# Patient Record
Sex: Female | Born: 1935 | Race: White | Hispanic: No | Smoking: Never smoker
Health system: Southern US, Community
[De-identification: ages and names within clinical notes are randomized; demographics above are authoritative.]

## PROBLEM LIST (undated history)

## (undated) DIAGNOSIS — W19XXXA Unspecified fall, initial encounter: Secondary | ICD-10-CM

## (undated) DIAGNOSIS — F0390 Unspecified dementia without behavioral disturbance: Secondary | ICD-10-CM

## (undated) DIAGNOSIS — H9190 Unspecified hearing loss, unspecified ear: Secondary | ICD-10-CM

## (undated) DIAGNOSIS — R519 Headache, unspecified: Secondary | ICD-10-CM

## (undated) DIAGNOSIS — R413 Other amnesia: Secondary | ICD-10-CM

## (undated) DIAGNOSIS — K859 Acute pancreatitis without necrosis or infection, unspecified: Secondary | ICD-10-CM

## (undated) DIAGNOSIS — F419 Anxiety disorder, unspecified: Secondary | ICD-10-CM

## (undated) DIAGNOSIS — R296 Repeated falls: Secondary | ICD-10-CM

## (undated) DIAGNOSIS — R51 Headache: Secondary | ICD-10-CM

## (undated) DIAGNOSIS — E119 Type 2 diabetes mellitus without complications: Secondary | ICD-10-CM

## (undated) HISTORY — DX: Repeated falls: R29.6

## (undated) HISTORY — PX: CHOLECYSTECTOMY: SHX55

## (undated) HISTORY — DX: Acute pancreatitis without necrosis or infection, unspecified: K85.90

## (undated) HISTORY — DX: Type 2 diabetes mellitus without complications: E11.9

## (undated) HISTORY — PX: COLONOSCOPY: SHX174

## (undated) HISTORY — DX: Unspecified fall, initial encounter: W19.XXXA

## (undated) HISTORY — DX: Unspecified dementia, unspecified severity, without behavioral disturbance, psychotic disturbance, mood disturbance, and anxiety: F03.90

---
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
## 1995-06-14 HISTORY — PX: GALLBLADDER SURGERY: SHX652

## 2001-06-13 HISTORY — PX: PARATHYROIDECTOMY / EXPLORATION OF PARATHYROIDS: SUR1002

## 2001-10-04 ENCOUNTER — Ambulatory Visit (HOSPITAL_COMMUNITY): Admission: RE | Admit: 2001-10-04 | Discharge: 2001-10-04 | Payer: Self-pay | Admitting: General Surgery

## 2001-10-04 ENCOUNTER — Encounter: Payer: Self-pay | Admitting: General Surgery

## 2002-03-11 ENCOUNTER — Encounter: Payer: Self-pay | Admitting: General Surgery

## 2002-03-13 ENCOUNTER — Ambulatory Visit (HOSPITAL_COMMUNITY): Admission: RE | Admit: 2002-03-13 | Discharge: 2002-03-14 | Payer: Self-pay | Admitting: General Surgery

## 2003-06-14 HISTORY — PX: RECTAL POLYPECTOMY: SHX2309

## 2003-06-14 HISTORY — PX: FLEXIBLE SIGMOIDOSCOPY: SHX1649

## 2003-10-08 ENCOUNTER — Ambulatory Visit (HOSPITAL_COMMUNITY): Admission: RE | Admit: 2003-10-08 | Discharge: 2003-10-08 | Payer: Self-pay | Admitting: Gastroenterology

## 2004-01-28 ENCOUNTER — Ambulatory Visit (HOSPITAL_COMMUNITY): Admission: RE | Admit: 2004-01-28 | Discharge: 2004-01-28 | Payer: Self-pay | Admitting: Gastroenterology

## 2004-03-05 ENCOUNTER — Ambulatory Visit (HOSPITAL_COMMUNITY): Admission: RE | Admit: 2004-03-05 | Discharge: 2004-03-05 | Payer: Self-pay | Admitting: General Surgery

## 2004-11-10 ENCOUNTER — Ambulatory Visit (HOSPITAL_COMMUNITY): Admission: RE | Admit: 2004-11-10 | Discharge: 2004-11-10 | Payer: Self-pay | Admitting: Gastroenterology

## 2013-05-08 ENCOUNTER — Encounter: Payer: Medicare Other | Attending: Internal Medicine | Admitting: Dietician

## 2013-05-08 VITALS — Ht 64.0 in | Wt 176.9 lb

## 2013-05-08 DIAGNOSIS — E119 Type 2 diabetes mellitus without complications: Secondary | ICD-10-CM

## 2013-05-08 DIAGNOSIS — Z713 Dietary counseling and surveillance: Secondary | ICD-10-CM | POA: Insufficient documentation

## 2013-05-08 NOTE — Progress Notes (Signed)
Patient was seen on 05/08/13 for the first of a series of three diabetes self-management courses at the [facility].  Current HbA1c: 6.7 on 7/28  The following learning objectives were met by the patient during this class:  Describe diabetes  State some common risk factors for diabetes  Defines the role of glucose and insulin  Identifies type of diabetes and pathophysiology  Describe the relationship between diabetes and cardiovascular risk  State the members of the Healthcare Team  States the rationale for glucose monitoring  State when to test glucose  State their individual Target Range  State the importance of logging glucose readings  Describe how to interpret glucose readings  Identifies A1C target  Explain the correlation between A1c and eAG values  State symptoms and treatment of high blood glucose  State symptoms and treatment of low blood glucose  Explain proper technique for glucose testing  Identifies proper sharps disposal  Handouts given during class include:  Living Well with Diabetes book  Carb Counting and Meal Planning book  Meal Plan Card  Carbohydrate guide  Meal planning worksheet  Low Sodium Flavoring Tips  The diabetes portion plate  Low Carbohydrate Snack Suggestions  A1c to eAG Conversion Chart  Diabetes Medications  Stress Management  Diabetes Recommended Care Schedule  Diabetes Success Plan  Core Class Satisfaction Survey  Your patient has identified their diabetes care support plan as:  [facility]  Staff   Follow-Up Plan:  Attend core 2

## 2013-05-08 NOTE — Patient Instructions (Signed)
Goals:  Monitor glucose levels as instructed by your doctor 

## 2013-05-15 ENCOUNTER — Encounter: Payer: Medicare Other | Attending: Internal Medicine | Admitting: Dietician

## 2013-05-15 DIAGNOSIS — E119 Type 2 diabetes mellitus without complications: Secondary | ICD-10-CM | POA: Insufficient documentation

## 2013-05-15 DIAGNOSIS — Z713 Dietary counseling and surveillance: Secondary | ICD-10-CM | POA: Insufficient documentation

## 2013-05-15 NOTE — Patient Instructions (Signed)
Goals:  Follow Diabetes Meal Plan as instructed  Eat 3 meals and 2 snacks, every 3-5 hrs  Limit carbohydrate intake to 30-45 grams carbohydrate/meal  Limit carbohydrate intake to 0-15 grams carbohydrate/snack  Add lean protein foods to meals/snacks  Monitor glucose levels as instructed by your doctor  Bring food record and glucose log to your next nutrition visit 

## 2013-05-15 NOTE — Progress Notes (Signed)

## 2013-06-11 DIAGNOSIS — E119 Type 2 diabetes mellitus without complications: Secondary | ICD-10-CM

## 2013-06-11 NOTE — Progress Notes (Signed)
Patient was seen on 06/11/13 for the third of a series of three diabetes self-management courses at the [facility]. The following learning objectives were met by the patient during this class:    State the amount of activity recommended for healthy living   Describe activities suitable for individual needs   Identify ways to regularly incorporate activity into daily life   Identify barriers to activity and ways to over come these barriers  Identify diabetes medications being personally used and their primary action for lowering glucose and possible side effects   Describe role of stress on blood glucose and develop strategies to address psychosocial issues   Identify diabetes complications and ways to prevent them  Explain how to manage diabetes during illness   Evaluate success in meeting personal goal   Establish 2-3 goals that they will plan to diligently work on until they return for the  16-month follow-up visit  Goals:  Follow Diabetes Meal Plan as instructed  Aim for 15-30 mins of physical activity daily as tolerated  Bring food record and glucose log to your follow up visit  Your patient has established the following 4 month goals in their individualized success plan:  Count carbohydrates at most meals and snacks  Reduce fat by eating less butter and mayonnaise at two or more meals a day  Your patient has identified these potential barriers to change:  I don't remember what I read  Colleen Huang stated that she was interested by the material covered in our Core classes, but she stated over and over that she was not retaining information that she learned or read and that she "was not going to be able to work on any of this until after the holidays".    Your patient has identified their diabetes self-care support plan as  [facility]  Colleen Huang, a long-time friend who also has diabetes

## 2013-10-09 ENCOUNTER — Encounter: Payer: Medicare Other | Attending: Internal Medicine | Admitting: *Deleted

## 2013-10-09 ENCOUNTER — Encounter: Payer: Self-pay | Admitting: *Deleted

## 2013-10-09 VITALS — Ht 64.0 in | Wt 174.7 lb

## 2013-10-09 DIAGNOSIS — E119 Type 2 diabetes mellitus without complications: Secondary | ICD-10-CM

## 2013-10-09 DIAGNOSIS — Z713 Dietary counseling and surveillance: Secondary | ICD-10-CM | POA: Insufficient documentation

## 2013-10-09 NOTE — Progress Notes (Signed)
  Patient was seen on 10/09/13 for her 4 month follow-up as a part of the diabetes self-management courses at the [facility].   Patient self reports the following: Nutrition: "Try to stay away from the sweets". Loves chocolate. Has something sweet 4-7 times per week. She does not really pay any attention to her dietary intake. She eats what ever she desires. This does not seem to be adversely affecting her glucose readings. Random BS: 98mg /dl Exercise: None Psychosocial: Colleen Huang lives alone and still drives. She notes that she has children, one as close as [place]. She does not recall her family checking in on her. She has an alert system that she does not wear. I have strongly encouraged her to wear this for her safety. She notes that she has poor memory, and does not have any recollection of the information she received in the core classes.  Diabetes control has improved since diabetes self-management training: No change due to poor memory Number of days blood glucose is >200: Does not test Last MD appointment for diabetes: 2 weeks ago Willingness to participate in diabetes support group: not at this time  Follow-Up Plan: Patient to call and schedule as needed.

## 2014-12-24 ENCOUNTER — Ambulatory Visit: Payer: Medicare Other | Admitting: Diagnostic Neuroimaging

## 2015-01-06 ENCOUNTER — Ambulatory Visit (INDEPENDENT_AMBULATORY_CARE_PROVIDER_SITE_OTHER): Payer: Medicare Other | Admitting: Neurology

## 2015-01-06 ENCOUNTER — Telehealth: Payer: Self-pay | Admitting: *Deleted

## 2015-01-06 ENCOUNTER — Encounter: Payer: Self-pay | Admitting: Neurology

## 2015-01-06 VITALS — BP 157/67 | HR 62 | Temp 97.7°F | Ht 64.0 in | Wt 168.8 lb

## 2015-01-06 DIAGNOSIS — R413 Other amnesia: Secondary | ICD-10-CM | POA: Diagnosis not present

## 2015-01-06 DIAGNOSIS — E785 Hyperlipidemia, unspecified: Secondary | ICD-10-CM

## 2015-01-06 DIAGNOSIS — E119 Type 2 diabetes mellitus without complications: Secondary | ICD-10-CM

## 2015-01-06 DIAGNOSIS — I10 Essential (primary) hypertension: Secondary | ICD-10-CM

## 2015-01-06 DIAGNOSIS — E538 Deficiency of other specified B group vitamins: Secondary | ICD-10-CM

## 2015-01-06 MED ORDER — DONEPEZIL HCL 10 MG PO TABS: 10.0000 mg | ORAL_TABLET | Freq: Every day | ORAL | Status: DC

## 2015-01-06 MED ORDER — DONEPEZIL HCL 5 MG PO TABS: 5.0000 mg | ORAL_TABLET | Freq: Every day | ORAL | Status: DC

## 2015-01-06 NOTE — Progress Notes (Signed)
[facility]    Provider:  Dr Lucia Gaskins Referring Provider: [name], MD Primary Care Physician:  [name], MD  CC:  Memory problems  HPI:  Colleen Huang is a 81 y.o. female here as a referral from Dr. Julio Sicks for memory problems. PMHx HTN, DM, HLD.   Here with daughter-in-law. Memory issues for many years, worsening in the last 5 years. Patient keeps a calendar and writes things down. She checks her calendar every day. She cant remember what happened yesterday, gets confused. More short-term memory loss. She can remember things that happened a long time ago. She is forgetting her grandchildren's birthday more recently. She lives at home alone. She left the oven on one time and burnt brownies. She pays her own bills, she is up to date on her bills. She knows the bills she needs to pay. She is fine day to day, cooks her own meals, takes care of herself. She drives short distances to familiar places like church and the grocery store. She doesn't like to drive at night. She is stubborn and independent. Likes to be independent. She has lost a lot of friends and that is upsetting but no mood problems or depression. No hallucinations or delusions. Not missing any medications, manages herself. She keeps her own house. No confusional episodes. She repeats things a lot. No alterations of consciousness. Brother with Alzheimer's. Patient says every once in a while she is confused. Patient says she didn't want to come here today.   Review of Systems: Patient complains of symptoms per HPI as well as the following symptoms: weight gain, memory loss, confusion, headache. Pertinent negatives per HPI. All others negative.   History   Social History  . Marital Status: Widowed    Spouse Name: N/A  . Number of Children: 4  . Years of Education: 15   Occupational History  . Not on file.   Social History Main Topics  . Smoking status: Never Smoker   . Smokeless  tobacco: Not on file  . Alcohol Use: No  . Drug Use: No  . Sexual Activity: Not on file   Other Topics Concern  . Not on file   Social History Narrative   Lives at home by herself.   Caffeine use: 1-2 cups hot tea/day.     History reviewed. No pertinent family history.  Past Medical History  Diagnosis Date  . Hyperlipidemia   . Obesity   . Diabetes mellitus without complication     Past Surgical History  Procedure Laterality Date  . Appendectomy  1935  . Tonsillectomy  1949  . Parathyroidectomy / exploration of parathyroids  2003  . Gallbladder surgery  1997  . Colonoscopy  2005, 2009  . Flexible sigmoidoscopy  2005  . Rectal polypectomy  2005    Current Outpatient Prescriptions  Medication Sig Dispense Refill  . amLODipine (NORVASC) 10 MG tablet Take 10 mg by mouth daily.  1  . aspirin 81 MG tablet Take 81 mg by mouth daily.    [name] atorvastatin (LIPITOR) 20 MG tablet Take 20 mg by mouth daily.    . Calcium Carb-Cholecalciferol (CALCIUM 600 + D PO) Take by mouth.    . Cholecalciferol (VITAMIN D3) 2000 UNITS TABS Take by mouth.    . fenofibrate (TRICOR) 145 MG tablet Take 145 mg by mouth daily.    [name] latanoprost (XALATAN) 0.005 % ophthalmic solution 1 drop at bedtime.    [name] levothyroxine (SYNTHROID, LEVOTHROID) 137 MCG tablet Take 137 mcg  by mouth daily.  11  . naproxen sodium (ANAPROX) 220 MG tablet Take 220 mg by mouth 2 (two) times daily with a meal.    . Omega-3 Fatty Acids (FISH OIL) 1200 MG CAPS Take by mouth.    . simvastatin (ZOCOR) 40 MG tablet Take 40 mg by mouth daily.    . timolol (BETIMOL) 0.5 % ophthalmic solution 1 drop 2 (two) times daily.    [name] donepezil (ARICEPT) 10 MG tablet Take 1 tablet (10 mg total) by mouth at bedtime. 30 tablet 11  . donepezil (ARICEPT) 5 MG tablet Take 1 tablet (5 mg total) by mouth at bedtime. 30 tablet 0  . orphenadrine (NORFLEX) 100 MG tablet Take 100 mg by mouth 2 (two) times daily.  0   No current facility-administered  medications for this visit.    Allergies as of 01/06/2015 - Review Complete 10/09/2013  Allergen Reaction Noted  . Codeine  05/08/2013  . Hydrocodone  05/08/2013  . Penicillins  05/08/2013  . Percocet [oxycodone-acetaminophen]  05/08/2013  . Sulfa antibiotics  05/08/2013    Vitals: BP 157/67 mmHg  Pulse 62  Temp(Src) 97.7 F (36.5 C) (Oral)  Ht 5\' 4"  (1.626 m)  Wt 168 lb 12.8 oz (76.567 kg)  BMI 28.96 kg/m2 Last Weight:  Wt Readings from Last 1 Encounters:  01/06/15 168 lb 12.8 oz (76.567 kg)   Last Height:   Ht Readings from Last 1 Encounters:  01/06/15 5\' 4"  (1.626 m)    Physical exam: Exam: Gen: NAD, conversant, well nourised, overweight, well groomed                     CV: RRR, no MRG. No Carotid Bruits. No peripheral edema, warm, nontender Eyes: Conjunctivae clear without exudates or hemorrhage  Neuro: Detailed Neurologic Exam  Speech:    Speech is normal; fluent and spontaneous with normal comprehension.  Cognition: MoCA 19/30 (-1 visuosptaial execution, -1 naming, -1 language, -2 abstraction, -5 delayed recall, -1 orientation)    The patient is oriented to person, place, month, year;     recent memory impaired and remote memory intact;     language fluent;     normal attention, concentration,     fund of knowledge appears grossly intact Cranial Nerves:    The pupils are equal, round, and reactive to light. Attempted fundi, could not visualize.  Visual fields are full to finger confrontation. Extraocular movements are intact. Trigeminal sensation is intact and the muscles of mastication are normal. The face is symmetric. The palate elevates in the midline. Hearing intact. Voice is normal. Shoulder shrug is normal. The tongue has normal motion without fasciculations.   Coordination:    No dysmetria   Gait:    Not ataxic  Motor Observation:    No asymmetry, no atrophy, and no involuntary movements noted. Tone:    Normal muscle tone.    Posture:     Posture is normal. normal erect    Strength:    Strength is V/V in the upper and lower limbs.      Sensation: intact to LT     Reflex Exam:  DTR's:    Deep tendon reflexes in the upper and lower extremities are symmetrical bilaterally.   Toes:    The toes are downgoing bilaterally.   Clonus:    Clonus is absent.      Assessment/Plan:  81 y.o. female here as a referral from Dr. Julio Sicks for memory problems. PMHx HTN, DM, HLD.  MoCA 19/30. Mild to moderate Cognitive impairment. Will order labs, MRi of the brain, will start aricept with instruction to increase to  after a month if tolerated. There can be serious side effects include cardiac complications but common side effects include nausea, diarrhea, gi upset. Stop for anything concerning.f/u 6 months.   Naomie Dean, MD  [facility] [address] [place], [place] [address]  Phone [phone] Fax [phone]

## 2015-01-06 NOTE — Patient Instructions (Signed)
Overall you are doing very well but I do want to suggest a few things today:   Remember to drink plenty of fluid, eat healthy meals and do not skip any meals. Try to eat protein with a every meal and eat a healthy snack such as fruit or nuts in between meals. Try to keep a regular sleep-wake schedule and try to exercise daily, particularly in the form of walking, 20-30 minutes a day, if you can.   As far as your medications are concerned, I would like to suggest: Start Aricept (Donepezil) . In one month can increase to  pills.  As far as diagnostic testing: Labs, MRi of the brain  I would like to see you back in 6 months, sooner if we need to. Please call us with any interim questions, concerns, problems, updates or refill requests.   Please also call us for any test results so we can go over those with you on the phone.  My clinical assistant and will answer any of your questions and relay your messages to me and also relay most of my messages to you.   Our phone number is [phone]. We also have an after hours call service for urgent matters and there is a physician on-call for urgent questions. For any emergencies you know to call 911 or go to the nearest emergency room

## 2015-01-06 NOTE — Telephone Encounter (Signed)
Left message for nurse to call back about whether the pt has had a recent MRI completed. Gave [facility] phone number and office hours.

## 2015-01-07 ENCOUNTER — Other Ambulatory Visit: Payer: Self-pay | Admitting: Neurology

## 2015-01-07 DIAGNOSIS — E538 Deficiency of other specified B group vitamins: Secondary | ICD-10-CM

## 2015-01-07 LAB — COMPREHENSIVE METABOLIC PANEL
ALT: 16 IU/L (ref 0–32)
AST: 15 IU/L (ref 0–40)
Albumin/Globulin Ratio: 1.8 (ref 1.1–2.5)
Albumin: 4.2 g/dL (ref 3.5–4.7)
Alkaline Phosphatase: 47 IU/L (ref 39–117)
BUN/Creatinine Ratio: 25 (ref 11–26)
BUN: 26 mg/dL (ref 8–27)
Bilirubin Total: 0.4 mg/dL (ref 0.0–1.2)
CO2: 23 mmol/L (ref 18–29)
Calcium: 10 mg/dL (ref 8.7–10.3)
Chloride: 100 mmol/L (ref 97–108)
Creatinine, Ser: 1.03 mg/dL — ABNORMAL HIGH (ref 0.57–1.00)
GFR calc Af Amer: 56 mL/min/{1.73_m2} — ABNORMAL LOW (ref >59)
GFR, EST NON AFRICAN AMERICAN: 48 mL/min/{1.73_m2} — AB (ref >59)
GLUCOSE: 99 mg/dL (ref 65–99)
Globulin, Total: 2.4 g/dL (ref 1.5–4.5)
POTASSIUM: 4.4 mmol/L (ref 3.5–5.2)
SODIUM: 140 mmol/L (ref 134–144)
Total Protein: 6.6 g/dL (ref 6.0–8.5)

## 2015-01-07 LAB — TSH: TSH: 0.451 u[IU]/mL (ref 0.450–4.500)

## 2015-01-07 LAB — B12 AND FOLATE PANEL
Folate: 11.8 ng/mL (ref >3.0)
Vitamin B-12: 170 pg/mL — ABNORMAL LOW (ref 211–946)

## 2015-01-07 LAB — RPR: RPR Ser Ql: NONREACTIVE

## 2015-01-07 NOTE — Telephone Encounter (Signed)
Left another VM for someone to call back about whether pt has had a recent MRI done. Gave pt name and DOB. Gave [facility] phone number and office hours. Told them I left a message yesterday morning as well.

## 2015-01-09 ENCOUNTER — Telehealth: Payer: Self-pay

## 2015-01-09 NOTE — Telephone Encounter (Signed)
LVM for pt to call office to receive lab results, she has very low B12. She needs to take oral B12 twice daily and repeat B12 in a month. B12 deficiency can cause serious conditions such as dementia. I have ordered a B12 follow up, she can come back in 4 weeks and go to the lab. No appointment needed. If her B12 does not improve she will need injections.

## 2015-01-13 ENCOUNTER — Ambulatory Visit: Payer: Medicare Other | Admitting: Diagnostic Neuroimaging

## 2015-01-14 ENCOUNTER — Encounter: Payer: Self-pay | Admitting: Neurology

## 2015-01-15 ENCOUNTER — Other Ambulatory Visit: Payer: Self-pay

## 2015-01-20 ENCOUNTER — Ambulatory Visit
Admission: RE | Admit: 2015-01-20 | Discharge: 2015-01-20 | Disposition: A | Discharge status: Home / Self Care | Payer: Medicare Other | Source: Ambulatory Visit | Attending: Neurology | Admitting: Neurology

## 2015-01-20 DIAGNOSIS — R413 Other amnesia: Secondary | ICD-10-CM

## 2015-01-25 ENCOUNTER — Telehealth: Payer: Self-pay | Admitting: Neurology

## 2015-01-25 NOTE — Telephone Encounter (Signed)
Called and spoke with patient. Discussed MRI of the brain as following. I tried to explain, she didn't have her earpiece in and had a hard time understanding me. This is normal for her age of 8. Asked for her grandaughter's phone number but she didn' thave it, I gave her [facility]'s number for granddaughter to call us tomorrow.  Kara Mead - if she calls, you can get her number or talk to her. Her grandmother's MRI showed atrophy and some non-specific white matter changes which are normal for age. In fact, it looks better that stated age.   1. Mild scattered periventricular, juxtacortical and subcortical foci of non-specific gliosis. 2. Mild diffuse atrophy. 3. No acute findings.

## 2015-02-10 ENCOUNTER — Other Ambulatory Visit: Payer: Self-pay | Admitting: Neurology

## 2015-02-10 ENCOUNTER — Telehealth: Payer: Self-pay | Admitting: Neurology

## 2015-02-10 NOTE — Telephone Encounter (Signed)
Patient was advised to take  for one month, then increase to .  A Rx was provided for these doses.

## 2015-02-10 NOTE — Telephone Encounter (Signed)
Colleen Huang with [facility] on [facility] called requesting refill for donepezil (ARICEPT) 5 MG tablet . He can be reached at [phone]

## 2015-04-14 DIAGNOSIS — K859 Acute pancreatitis without necrosis or infection, unspecified: Secondary | ICD-10-CM

## 2015-04-14 HISTORY — DX: Acute pancreatitis without necrosis or infection, unspecified: K85.90

## 2015-05-14 ENCOUNTER — Inpatient Hospital Stay (HOSPITAL_COMMUNITY)
Admission: EM | Admit: 2015-05-14 | Discharge: 2015-05-17 | DRG: 439 | Disposition: A | Discharge status: Home Health | Payer: Medicare Other | Attending: Internal Medicine | Admitting: Internal Medicine

## 2015-05-14 ENCOUNTER — Emergency Department (HOSPITAL_COMMUNITY): Payer: Medicare Other

## 2015-05-14 ENCOUNTER — Encounter (HOSPITAL_COMMUNITY): Payer: Self-pay | Admitting: Emergency Medicine

## 2015-05-14 ENCOUNTER — Other Ambulatory Visit: Payer: Self-pay

## 2015-05-14 DIAGNOSIS — E876 Hypokalemia: Secondary | ICD-10-CM | POA: Diagnosis present

## 2015-05-14 DIAGNOSIS — Z880 Allergy status to penicillin: Secondary | ICD-10-CM | POA: Diagnosis not present

## 2015-05-14 DIAGNOSIS — F0390 Unspecified dementia without behavioral disturbance: Secondary | ICD-10-CM | POA: Diagnosis present

## 2015-05-14 DIAGNOSIS — Z7982 Long term (current) use of aspirin: Secondary | ICD-10-CM

## 2015-05-14 DIAGNOSIS — Z9049 Acquired absence of other specified parts of digestive tract: Secondary | ICD-10-CM | POA: Diagnosis not present

## 2015-05-14 DIAGNOSIS — K859 Acute pancreatitis without necrosis or infection, unspecified: Secondary | ICD-10-CM | POA: Diagnosis present

## 2015-05-14 DIAGNOSIS — R748 Abnormal levels of other serum enzymes: Secondary | ICD-10-CM | POA: Diagnosis present

## 2015-05-14 DIAGNOSIS — I10 Essential (primary) hypertension: Secondary | ICD-10-CM | POA: Diagnosis present

## 2015-05-14 DIAGNOSIS — Z885 Allergy status to narcotic agent status: Secondary | ICD-10-CM

## 2015-05-14 DIAGNOSIS — R0789 Other chest pain: Secondary | ICD-10-CM | POA: Diagnosis not present

## 2015-05-14 DIAGNOSIS — K219 Gastro-esophageal reflux disease without esophagitis: Secondary | ICD-10-CM | POA: Diagnosis present

## 2015-05-14 DIAGNOSIS — R413 Other amnesia: Secondary | ICD-10-CM | POA: Diagnosis not present

## 2015-05-14 DIAGNOSIS — E785 Hyperlipidemia, unspecified: Secondary | ICD-10-CM

## 2015-05-14 DIAGNOSIS — R072 Precordial pain: Secondary | ICD-10-CM | POA: Diagnosis not present

## 2015-05-14 DIAGNOSIS — Z882 Allergy status to sulfonamides status: Secondary | ICD-10-CM | POA: Diagnosis not present

## 2015-05-14 DIAGNOSIS — E119 Type 2 diabetes mellitus without complications: Secondary | ICD-10-CM | POA: Diagnosis present

## 2015-05-14 DIAGNOSIS — Z79899 Other long term (current) drug therapy: Secondary | ICD-10-CM | POA: Diagnosis not present

## 2015-05-14 DIAGNOSIS — E039 Hypothyroidism, unspecified: Secondary | ICD-10-CM | POA: Diagnosis present

## 2015-05-14 DIAGNOSIS — R1012 Left upper quadrant pain: Secondary | ICD-10-CM | POA: Diagnosis not present

## 2015-05-14 DIAGNOSIS — N390 Urinary tract infection, site not specified: Secondary | ICD-10-CM | POA: Diagnosis present

## 2015-05-14 DIAGNOSIS — K8500 Idiopathic acute pancreatitis without necrosis or infection: Secondary | ICD-10-CM | POA: Diagnosis not present

## 2015-05-14 DIAGNOSIS — K858 Other acute pancreatitis without necrosis or infection: Secondary | ICD-10-CM

## 2015-05-14 LAB — COMPREHENSIVE METABOLIC PANEL
ALK PHOS: 53 U/L (ref 38–126)
ALT: 17 U/L (ref 14–54)
AST: 20 U/L (ref 15–41)
Albumin: 3.9 g/dL (ref 3.5–5.0)
Anion gap: 9 (ref 5–15)
BUN: 25 mg/dL — AB (ref 6–20)
CALCIUM: 9.7 mg/dL (ref 8.9–10.3)
CO2: 26 mmol/L (ref 22–32)
CREATININE: 0.82 mg/dL (ref 0.44–1.00)
Chloride: 106 mmol/L (ref 101–111)
Glucose, Bld: 138 mg/dL — ABNORMAL HIGH (ref 65–99)
Potassium: 3.4 mmol/L — ABNORMAL LOW (ref 3.5–5.1)
Sodium: 141 mmol/L (ref 135–145)
Total Bilirubin: 0.8 mg/dL (ref 0.3–1.2)
Total Protein: 7.1 g/dL (ref 6.5–8.1)

## 2015-05-14 LAB — CBC WITH DIFFERENTIAL/PLATELET
Basophils Absolute: 0 10*3/uL (ref 0.0–0.1)
Basophils Relative: 0 %
Eosinophils Absolute: 0 10*3/uL (ref 0.0–0.7)
Eosinophils Relative: 1 %
HEMATOCRIT: 35.7 % — AB (ref 36.0–46.0)
HEMOGLOBIN: 12.2 g/dL (ref 12.0–15.0)
LYMPHS ABS: 0.9 10*3/uL (ref 0.7–4.0)
LYMPHS PCT: 12 %
MCH: 29.9 pg (ref 26.0–34.0)
MCHC: 34.2 g/dL (ref 30.0–36.0)
MCV: 87.5 fL (ref 78.0–100.0)
Monocytes Absolute: 0.5 10*3/uL (ref 0.1–1.0)
Monocytes Relative: 7 %
NEUTROS ABS: 6.3 10*3/uL (ref 1.7–7.7)
NEUTROS PCT: 80 %
Platelets: 326 10*3/uL (ref 150–400)
RBC: 4.08 MIL/uL (ref 3.87–5.11)
RDW: 13.1 % (ref 11.5–15.5)
WBC: 7.8 10*3/uL (ref 4.0–10.5)

## 2015-05-14 LAB — I-STAT TROPONIN, ED: TROPONIN I, POC: 0.01 ng/mL (ref 0.00–0.08)

## 2015-05-14 LAB — URINALYSIS, ROUTINE W REFLEX MICROSCOPIC
Bilirubin Urine: NEGATIVE
GLUCOSE, UA: NEGATIVE mg/dL
Hgb urine dipstick: NEGATIVE
KETONES UR: NEGATIVE mg/dL
NITRITE: NEGATIVE
PH: 8 (ref 5.0–8.0)
Protein, ur: NEGATIVE mg/dL
SPECIFIC GRAVITY, URINE: 1.013 (ref 1.005–1.030)

## 2015-05-14 LAB — GLUCOSE, CAPILLARY
GLUCOSE-CAPILLARY: 106 mg/dL — AB (ref 65–99)
Glucose-Capillary: 107 mg/dL — ABNORMAL HIGH (ref 65–99)
Glucose-Capillary: 120 mg/dL — ABNORMAL HIGH (ref 65–99)

## 2015-05-14 LAB — I-STAT CG4 LACTIC ACID, ED
LACTIC ACID, VENOUS: 0.77 mmol/L (ref 0.5–2.0)
Lactic Acid, Venous: 1.12 mmol/L (ref 0.5–2.0)

## 2015-05-14 LAB — LIPASE, BLOOD: LIPASE: 331 U/L — AB (ref 11–51)

## 2015-05-14 LAB — URINE MICROSCOPIC-ADD ON: RBC / HPF: NONE SEEN RBC/hpf (ref 0–5)

## 2015-05-14 MED ORDER — FENTANYL CITRATE (PF) 100 MCG/2ML IJ SOLN
12.5000 ug | INTRAMUSCULAR | Status: DC | PRN
  Administered 2015-05-15 – 2015-05-16 (×9): 12.5 ug via INTRAVENOUS
  Filled 2015-05-14 (×9): qty 2

## 2015-05-14 MED ORDER — LATANOPROST 0.005 % OP SOLN
1.0000 [drp] | Freq: Every day | OPHTHALMIC | Status: DC
  Administered 2015-05-16 (×2): 1 [drp] via OPHTHALMIC
  Filled 2015-05-14: qty 2.5

## 2015-05-14 MED ORDER — OMEGA-3-ACID ETHYL ESTERS 1 G PO CAPS
1.0000 g | ORAL_CAPSULE | Freq: Every day | ORAL | Status: DC
  Administered 2015-05-15 – 2015-05-17 (×2): 1 g via ORAL
  Filled 2015-05-14 (×3): qty 1

## 2015-05-14 MED ORDER — ONDANSETRON HCL 4 MG/2ML IJ SOLN
4.0000 mg | Freq: Once | INTRAMUSCULAR | Status: AC
  Administered 2015-05-14: 4 mg via INTRAVENOUS
  Filled 2015-05-14: qty 2

## 2015-05-14 MED ORDER — ASPIRIN EC 81 MG PO TBEC
81.0000 mg | DELAYED_RELEASE_TABLET | Freq: Every evening | ORAL | Status: DC
  Administered 2015-05-15 – 2015-05-17 (×3): 81 mg via ORAL
  Filled 2015-05-14 (×4): qty 1

## 2015-05-14 MED ORDER — MORPHINE SULFATE (PF) 2 MG/ML IV SOLN: 1.0000 mg | INTRAVENOUS | Status: DC | PRN

## 2015-05-14 MED ORDER — ENOXAPARIN SODIUM 40 MG/0.4ML ~~LOC~~ SOLN
40.0000 mg | SUBCUTANEOUS | Status: DC
  Administered 2015-05-15 – 2015-05-16 (×2): 40 mg via SUBCUTANEOUS
  Filled 2015-05-14 (×4): qty 0.4

## 2015-05-14 MED ORDER — AMLODIPINE BESYLATE 10 MG PO TABS
10.0000 mg | ORAL_TABLET | Freq: Every evening | ORAL | Status: DC
  Administered 2015-05-15 – 2015-05-17 (×3): 10 mg via ORAL
  Filled 2015-05-14 (×4): qty 1

## 2015-05-14 MED ORDER — FENTANYL CITRATE (PF) 100 MCG/2ML IJ SOLN
12.5000 ug | Freq: Once | INTRAMUSCULAR | Status: AC
  Administered 2015-05-14: 12.5 ug via INTRAVENOUS
  Filled 2015-05-14: qty 2

## 2015-05-14 MED ORDER — IOHEXOL 300 MG/ML  SOLN
100.0000 mL | Freq: Once | INTRAMUSCULAR | Status: AC | PRN
  Administered 2015-05-14: 100 mL via INTRAVENOUS

## 2015-05-14 MED ORDER — IOHEXOL 300 MG/ML  SOLN
50.0000 mL | Freq: Once | INTRAMUSCULAR | Status: AC | PRN
Start: 2015-05-14 — End: 2015-05-14
  Administered 2015-05-14: 50 mL via ORAL

## 2015-05-14 MED ORDER — FENTANYL CITRATE (PF) 100 MCG/2ML IJ SOLN
12.5000 ug | INTRAMUSCULAR | Status: DC | PRN
  Administered 2015-05-14 (×3): 12.5 ug via INTRAVENOUS
  Filled 2015-05-14 (×3): qty 2

## 2015-05-14 MED ORDER — CHOLECALCIFEROL 10 MCG (400 UNIT) PO TABS
400.0000 [IU] | ORAL_TABLET | Freq: Two times a day (BID) | ORAL | Status: DC
  Administered 2015-05-14 – 2015-05-17 (×5): 400 [IU] via ORAL
  Filled 2015-05-14 (×7): qty 1

## 2015-05-14 MED ORDER — ACETAMINOPHEN 325 MG PO TABS: 650.0000 mg | ORAL_TABLET | Freq: Four times a day (QID) | ORAL | Status: DC | PRN

## 2015-05-14 MED ORDER — CIPROFLOXACIN IN D5W 400 MG/200ML IV SOLN
400.0000 mg | Freq: Two times a day (BID) | INTRAVENOUS | Status: DC
  Administered 2015-05-14 – 2015-05-17 (×6): 400 mg via INTRAVENOUS
  Filled 2015-05-14 (×7): qty 200

## 2015-05-14 MED ORDER — PROMETHAZINE HCL 25 MG/ML IJ SOLN
12.5000 mg | Freq: Four times a day (QID) | INTRAMUSCULAR | Status: AC | PRN
  Administered 2015-05-14 – 2015-05-15 (×2): 12.5 mg via INTRAVENOUS
  Filled 2015-05-14 (×2): qty 1

## 2015-05-14 MED ORDER — DONEPEZIL HCL 10 MG PO TABS
10.0000 mg | ORAL_TABLET | Freq: Every day | ORAL | Status: DC
  Administered 2015-05-14 – 2015-05-16 (×3): 10 mg via ORAL
  Filled 2015-05-14 (×4): qty 1

## 2015-05-14 MED ORDER — ONDANSETRON HCL 4 MG PO TABS: 4.0000 mg | ORAL_TABLET | Freq: Four times a day (QID) | ORAL | Status: DC | PRN

## 2015-05-14 MED ORDER — ONDANSETRON HCL 4 MG/2ML IJ SOLN
4.0000 mg | Freq: Four times a day (QID) | INTRAMUSCULAR | Status: DC | PRN
  Administered 2015-05-14 – 2015-05-16 (×4): 4 mg via INTRAVENOUS
  Filled 2015-05-14 (×4): qty 2

## 2015-05-14 MED ORDER — ACETAMINOPHEN 650 MG RE SUPP: 650.0000 mg | Freq: Four times a day (QID) | RECTAL | Status: DC | PRN

## 2015-05-14 MED ORDER — INSULIN ASPART 100 UNIT/ML ~~LOC~~ SOLN
0.0000 [IU] | SUBCUTANEOUS | Status: DC
  Administered 2015-05-16: 2 [IU] via SUBCUTANEOUS

## 2015-05-14 MED ORDER — DEXTROSE 5 % IV SOLN: 1.0000 g | INTRAVENOUS | Status: DC

## 2015-05-14 MED ORDER — LEVOTHYROXINE SODIUM 137 MCG PO TABS
137.0000 ug | ORAL_TABLET | Freq: Every day | ORAL | Status: DC
  Administered 2015-05-15 – 2015-05-17 (×3): 137 ug via ORAL
  Filled 2015-05-14 (×4): qty 1

## 2015-05-14 MED ORDER — SODIUM CHLORIDE 0.9 % IV SOLN
INTRAVENOUS | Status: DC
  Administered 2015-05-14 – 2015-05-17 (×7): via INTRAVENOUS
  Filled 2015-05-14 (×9): qty 1000

## 2015-05-14 NOTE — H&P (Addendum)
[facility] History and Physical  Colleen Huang ZOX:[id number] DOB: 1924/05/10 DOA: 05/14/2015  Referring physician: Dr. Corlis Leak PCP: [name], MD   Chief Complaint:  Abdominal pain with vomiting since one day  HPI:  81 year old female with history of hypertension, hyperlipidemia, diabetes mellitus (diet-controlled), mild progressive dementia who presented to her PCP office yesterday for severe headache and elevated blood pressure at home. Patient reported feeling dizzy. During the night she had severe left upper abdominal pain associated with 3 episodes of vomiting. Denies any hematemesis. No aggravating or relieving factors. Patient unable to describe her pain symptoms. Denies any sick contact, eating anything outside, being on antibiotic prior to yesterday, recent travel or any change in her medications. When seen by her PCP she had workup done which showed UTI and was discharged on oral ciprofloxacin. Patient denies  fever, chills,  chest pain, palpitations, SOB,  bowel or urinary symptoms. Denies change in weight or appetite. [name] went to see her this morning when she had persistent pain and had another episode of vomiting. She was brought to the ED.  Course in the ED Vitals were stable. Blood work done showed normal CBC, low potassium, BUN of 25, normal creatinine and glucose of 138. Lactic acid was normal. LFTs were normal. Lipase was elevated to 331. Patient given IV fentanyl and Zofran with some improvement in her symptoms. UA repeated and was suggestive of UTI. Given her persistent abdominal pain CT scan of the abdomen and pelvis was done which showed small amount of fluid tracking posteriorly and inferior to the pancreas with mesenteric thickening in the region of the body of the pancreas suggestive of acute frank otitis. No pancreatic mass or pseudocyst seen. Gallbladder was absent and CBD was on the upper normal size without mass or  calculus.  Hospitalists admission requested to medical floor. During my evaluation patient's pain symptoms had improved and did not have further nausea or vomiting. Patient reported feeling hungry.    Review of Systems:  Constitutional: Denies fever, chills, diaphoresis, appetite change and fatigue.  HEENT: Denies visual or hearing symptoms, congestion, sore throat, trouble swallowing, neck pain or stiffness   Respiratory: Denies SOB, DOE, cough, chest tightness,  and wheezing.   Cardiovascular: Denies chest pain, palpitations and leg swelling.  Gastrointestinal: nausea, vomiting, abdominal pain, denies diarrhea, constipation, blood in stool and abdominal distention.  Genitourinary: Denies dysuria, urgency, frequency, hematuria, flank pain and difficulty urinating.  Endocrine: Denies: hot or cold intolerance,  polyuria, polydipsia. Musculoskeletal: Denies myalgias, back pain, joint swelling, arthralgias and gait problem.  Skin: Denies pallor, rash and wound.  Neurological: dizziness, headache, denies seizures, syncope, weakness, light-headedness, numbness and headaches.  Hematological: Denies adenopathy.  Psychiatric/Behavioral: Denies confusion    Past Medical History  Diagnosis Date  . Hyperlipidemia   . Obesity   . Diabetes mellitus without complication [facility])    Past Surgical History  Procedure Laterality Date  . Appendectomy  1935  . Tonsillectomy  1949  . Parathyroidectomy / exploration of parathyroids  2003  . Gallbladder surgery  1997  . Colonoscopy  2005, 2009  . Flexible sigmoidoscopy  2005  . Rectal polypectomy  2005   Social History:  reports that she has never smoked. She does not have any smokeless tobacco history on file. She reports that she does not drink alcohol or use illicit drugs.  Allergies  Allergen Reactions  . Codeine     Really bad reaction that's all she can remember   . Hydrocodone  Really bad reaction that's all she can remember   .  Penicillins Itching, Swelling and Rash    Has patient had a PCN reaction causing immediate rash, facial/tongue/throat swelling, SOB or lightheadedness with hypotension: Yes  Has patient had a PCN reaction causing severe rash involving mucus membranes or skin necrosis: Yes  Has patient had a PCN reaction that required hospitalization: No  Has patient had a PCN reaction occurring within the last 10 years: No  If all of the above answers are "NO", then may proceed with Cephalosporin use.   [name] Percocet [Oxycodone-Acetaminophen]     unknown  . Sulfa Antibiotics     Unknown reaction     Family History  Problem Relation Age of Onset  . Dementia Brother     Prior to Admission medications   Medication Sig Start Date End Date Taking? Authorizing Provider  amLODipine (NORVASC) 10 MG tablet Take 10 mg by mouth every evening.  10/09/14  Yes Historical Provider, MD  aspirin 325 MG tablet Take 325 mg by mouth daily with breakfast.   Yes Historical Provider, MD  aspirin 81 MG tablet Take 81 mg by mouth every evening.    Yes Historical Provider, MD  atorvastatin (LIPITOR) 20 MG tablet Take 20 mg by mouth at bedtime.    Yes Historical Provider, MD  Calcium Carb-Cholecalciferol (CALCIUM 600 + D PO) Take 1 tablet by mouth 2 (two) times daily.    Yes Historical Provider, MD  Cholecalciferol (VITAMIN D PO) Take 1 tablet by mouth 2 (two) times daily.   Yes Historical Provider, MD  ciprofloxacin (CIPRO) 500 MG tablet Take 1 tablet by mouth 2 (two) times daily. Started 11/30 for 3 days 05/13/15  Yes Historical Provider, MD  donepezil (ARICEPT) 10 MG tablet Take 1 tablet (10 mg total) by mouth at bedtime. 02/10/15  Yes Anson Fret, MD  fenofibrate (TRICOR) 145 MG tablet Take 145 mg by mouth every evening.    Yes Historical Provider, MD  latanoprost (XALATAN) 0.005 % ophthalmic solution Place 1 drop into both eyes at bedtime.    Yes Historical Provider, MD  Omega-3 Fatty Acids (FISH OIL PO) Take 1 capsule by  mouth 2 (two) times daily.   Yes Historical Provider, MD  donepezil (ARICEPT) 10 MG tablet Take 1 tablet (10 mg total) by mouth at bedtime. Patient not taking: Reported on 05/14/2015 01/06/15   Anson Fret, MD  levothyroxine (SYNTHROID, LEVOTHROID) 137 MCG tablet Take 137 mcg by mouth daily. 12/19/14   Historical Provider, MD  naproxen sodium (ANAPROX) 220 MG tablet Take 220 mg by mouth 2 (two) times daily with a meal.    Historical Provider, MD  orphenadrine (NORFLEX) 100 MG tablet Take 100 mg by mouth 2 (two) times daily. 01/02/15   Historical Provider, MD  simvastatin (ZOCOR) 40 MG tablet Take 40 mg by mouth daily.    Historical Provider, MD  timolol (BETIMOL) 0.5 % ophthalmic solution 1 drop 2 (two) times daily.    Historical Provider, MD     Physical Exam:  Filed Vitals:   05/14/15 0900 05/14/15 0924 05/14/15 1000 05/14/15 1100  BP: 161/61 132/61 147/59 139/56  Pulse: 70 63 61 60  Temp:      TempSrc:      Resp: SpO2: 100% 100% 100% 100%    Constitutional: Vital signs reviewed. Elderly female not in distress HEENT: no pallor, no icterus, moist oral mucosa, no cervical lymphadenopathy Cardiovascular: RRR, S1 normal, S2 normal,  no MRG Chest: CTAB, no wheezes, rales, or rhonchi Abdominal: Soft. non-distended, bowel sounds are normal, left ordered for cord and tenderness to palpation  GU: no CVA tenderness Ext: warm, no edema Neurological: Alert and oriented, nonfocal  Labs on Admission:  Basic Metabolic Panel:  Recent Labs Lab 05/14/15 0748  NA 141  K 3.4*  CL 106  CO2 26  GLUCOSE 138*  BUN 25*  CREATININE 0.82  CALCIUM 9.7   Liver Function Tests:  Recent Labs Lab 05/14/15 0748  AST 20  ALT 17  ALKPHOS 53  BILITOT 0.8  PROT 7.1  ALBUMIN 3.9    Recent Labs Lab 05/14/15 0748  LIPASE 331*   No results for input(s): AMMONIA in the last 168 hours. CBC:  Recent Labs Lab 05/14/15 0748  WBC 7.8  NEUTROABS 6.3  HGB 12.2  HCT 35.7*  MCV  87.5  PLT 326   Cardiac Enzymes: No results for input(s): CKTOTAL, CKMB, CKMBINDEX, TROPONINI in the last 168 hours. BNP: Invalid input(s): POCBNP CBG: No results for input(s): GLUCAP in the last 168 hours.  Radiological Exams on Admission: Ct Abdomen Pelvis W Contrast  05/14/2015  CLINICAL DATA:  Three-day history of left upper quadrant pain EXAM: CT ABDOMEN AND PELVIS WITH CONTRAST TECHNIQUE: Multidetector CT imaging of the abdomen and pelvis was performed using the standard protocol following bolus administration of intravenous contrast. Oral contrast was also administered. CONTRAST:  50mL OMNIPAQUE IOHEXOL 300 MG/ML SOLN, OMNIPAQUE IOHEXOL 300 MG/ML SOLN COMPARISON:  None. FINDINGS: Lower chest:  Lung bases are clear. Hepatobiliary: No focal liver lesions are appreciable. Gallbladder is absent. Common bile duct measures 10 mm which is extreme upper normal for post cholecystectomy state. No biliary duct mass or calculus is appreciable. Pancreas: No pancreatic mass is seen. There is equivocal mesenteric thickening immediately adjacent to the pancreas. A small amount of fluid is seen slightly posteriorly and inferior to the pancreas extending to the left lateral conal fascia, likely secondary to a degree of acute pancreatitis. There is no pancreatic pseudocyst or duct dilatation. Spleen: No splenic lesions are identified. There are 3 small calcified splenic artery aneurysms, largest measuring 1.3 x 1.3 cm. Adrenals/Urinary Tract: Adrenals bilaterally appear normal. Kidneys bilaterally show no demonstrable mass or hydronephrosis. No renal or ureteral calculus is appreciable. Urinary bladder is midline with normal wall thickness. Stomach/Bowel: There are occasional sigmoid diverticula without diverticulitis. There is no bowel obstruction. No free air or portal venous air. Vascular/Lymphatic: There is atherosclerotic calcification throughout the aorta and proximal common iliac arteries. There is no  abdominal aortic aneurysm. Major mesenteric arteries appear patent. There is no demonstrable adenopathy in the abdomen or pelvis. Reproductive: Small calcifications in the uterus probably represent mild leiomyomatous change. There is no pelvic mass or pelvic fluid collection. Other: Appendix absent. No periappendiceal region inflammation. No abscess or ascites in the abdomen or pelvis. Musculoskeletal: There is degenerative change throughout the lower thoracic and lumbar spine regions. There is anterior wedging of the L5 vertebral body, age uncertain. There is slight anterolisthesis of L3 on L4, felt to be due to underlying spondylosis. There are no blastic or lytic bone lesions. There is no intramuscular or abdominal wall lesion. IMPRESSION: There is a small amount of fluid tracking posteriorly and inferiorly to the pancreas to the level of the left lateral conal fascia. There is subtle mesenteric thickening in the region of the body of the pancreas. These findings are felt to represent a degree of acute pancreatitis. There is no  pancreatic mass or pseudocyst. The pancreatic duct is not dilated. The overall contour of the pancreas is not altered. Small calcified splenic artery aneurysms, not felt to have clinical significance in this age group. Gallbladder absent. Common bile duct is extreme upper normal in size for postcholecystectomy state. No mass or calculus is seen in the biliary ductal system. Appendix absent. Small calcifications in the uterus are likely due to leiomyomatous change. No dominant uterine mass appreciable. Extensive atherosclerotic calcification throughout the aorta and common iliac arteries. Occasional sigmoid diverticula without diverticulitis. Age uncertain anterior wedging of the L5 vertebral body. Mild spondylolisthesis at L3-4, likely due to underlying spondylosis. Electronically Signed   By: Bretta BangWilliam  Woodruff III M.D.   On: 05/14/2015 10:17    EKG: Independently reviewed. Sinus rhythm  at 69, no ST-T changes  Assessment/Plan  Principal problem Acute pancreatitis Etiology unclear. No history of alkaline use. Status post cholecystectomy several years ago. LFTs normal. No new medications. The only potential medications that are high risk of causing it is statin and fibrates. I have held both of them. Check triglycerides Keep nothing by mouth except for sips with meds. Symptoms improved after receiving fentanyl and Zofran in the ED. -IV hydration with normal saline. Monitor lipase. Serial abdominal exam. Pain controlled with when necessary fentanyl. Supportive care with Tylenol and Zofran.  Active Problems: UTI Patient started on ciprofloxacin by her PCP yesterday. Follow urine culture. Continue cipro for now.   Type 2 diabetes mellitus Diet-controlled. If this is stable.  essential hypertension Continue amlodipine     Memory loss As per son this is gradually progressive with forgetfulness. Patient is alert and well oriented during my conversation. She is independent of most of her ADLs at baseline. Was seen by neurologist as outpatient and has been placed on Aricept.      HLD (hyperlipidemia) Check lipid panel. I have held her statin and fibrate.  Hypothyroidism Continue Synthroid  Hypokalemia Replenish KCl. In fluids   Diet: Sips with meds  DVT prophylaxis: sq lovenox   Code Status: full code Family Communication: discussed with son and daughter-in-law at bedside Disposition Plan: Admit to MedSurg. Discharge home once improved (possibly in 3-4 days.  Eddie NorthDHUNGEL, Silus Lanzo [facility] Pager [phone]  Total time spent on admission :70 minutes  If 7PM-7AM, please contact night-coverage [email or web address] Password [email or web address] 05/14/2015, 12:06 PM

## 2015-05-14 NOTE — ED Notes (Signed)
RN to call back for report 

## 2015-05-14 NOTE — ED Notes (Signed)
Pt BIBA, reports pt has called out x3 to 911 for abd pain. Pt's son has come to pt's home and pt refuses EMS. This morning son stated that pain has become increasingly worse. Pt also c/o n/v. Pt given 4mg  Zofran IV en route. Pt began c/o CP when pulling into EMS bay. Pt moaning in triage.

## 2015-05-14 NOTE — Progress Notes (Signed)
New Admission Note:   Arrival Method: Stretcher Mental Orientation: alert & oriented x4 Telemetry: no Assessment: Completed Skin: clean, dry, intact, no breakdown IV: clean, dry, intact L AC Pain: abdominal pain LLQ, 8/10 Tubes: none Safety Measures: Safety Fall Prevention Plan has been given, discussed and signed Admission: Completed [facility] Orientation: Patient has been orientated to the room, unit and staff.  Family: daughter present at the bedside.   Orders have been reviewed and implemented. Will continue to monitor the patient. Call light has been placed within reach and bed alarm has been activated.   Viviana SimplerNakiyha Marsheila Alejo BSN, RN Phone number:

## 2015-05-14 NOTE — ED Notes (Signed)
MD at bedside. 

## 2015-05-14 NOTE — ED Notes (Signed)
Pt transfer was delayed.  Hospitalist at bedside

## 2015-05-14 NOTE — ED Provider Notes (Signed)
CSN: [id number]     Arrival date & time 05/14/15  4098 History   First MD Initiated Contact with Patient 05/14/15 [phone]     Chief Complaint  Patient presents with  . Abdominal Pain     (Consider location/radiation/quality/duration/timing/severity/associated sxs/prior Treatment) HPI  Patient is a lovely 81 year old female presenting with left upper quadrant pain since last night. Patient is not a great historian. She said that she was having pain somewhere elsewhere in  body yesterday which drove her to go to the doctor. She is unsure where that pain was. She does not remember where or when. She is oriented to date and place at this time. She reports that she has been vomiting overnight. She denies any shortness of breath. She denies any diarrhea or constipation. She denies fever but endorses chills. She describes the pain as sharp and constant and it  has gotten much better since she arrived here in the hospital.   Past Medical History  Diagnosis Date  . Hyperlipidemia   . Obesity   . Diabetes mellitus without complication [facility])    Past Surgical History  Procedure Laterality Date  . Appendectomy  1935  . Tonsillectomy  1949  . Parathyroidectomy / exploration of parathyroids  2003  . Gallbladder surgery  1997  . Colonoscopy  2005, 2009  . Flexible sigmoidoscopy  2005  . Rectal polypectomy  2005   Family History  Problem Relation Age of Onset  . Dementia Brother    Social History  Substance Use Topics  . Smoking status: Never Smoker   . Smokeless tobacco: None  . Alcohol Use: No   OB History    No data available     Review of Systems  Constitutional: Negative for fever, activity change and fatigue.  HENT: Negative for congestion.   Eyes: Negative for discharge.  Respiratory: Negative for cough.   Cardiovascular: Negative for chest pain.  Gastrointestinal: Positive for vomiting and abdominal pain. Negative for abdominal distention.  Genitourinary: Negative for dysuria  and difficulty urinating.  Skin: Negative for rash.  Allergic/Immunologic: Negative for immunocompromised state.  Neurological: Negative for seizures and speech difficulty.  Psychiatric/Behavioral: Negative for agitation.      Allergies  Codeine; Hydrocodone; Penicillins; Percocet; and Sulfa antibiotics  Home Medications   Prior to Admission medications   Medication Sig Start Date End Date Taking? Authorizing Provider  amLODipine (NORVASC) 10 MG tablet Take 10 mg by mouth every evening.  10/09/14  Yes Historical Provider, MD  aspirin 325 MG tablet Take 325 mg by mouth daily with breakfast.   Yes Historical Provider, MD  aspirin 81 MG tablet Take 81 mg by mouth every evening.    Yes Historical Provider, MD  atorvastatin (LIPITOR) 20 MG tablet Take 20 mg by mouth at bedtime.    Yes Historical Provider, MD  Calcium Carb-Cholecalciferol (CALCIUM 600 + D PO) Take 1 tablet by mouth 2 (two) times daily.    Yes Historical Provider, MD  Cholecalciferol (VITAMIN D PO) Take 1 tablet by mouth 2 (two) times daily.   Yes Historical Provider, MD  ciprofloxacin (CIPRO) 500 MG tablet Take 1 tablet by mouth 2 (two) times daily. Started 11/30 for 3 days 05/13/15  Yes Historical Provider, MD  donepezil (ARICEPT) 10 MG tablet Take 1 tablet (10 mg total) by mouth at bedtime. 02/10/15  Yes Anson Fret, MD  fenofibrate (TRICOR) 145 MG tablet Take 145 mg by mouth every evening.    Yes Historical Provider, MD  latanoprost Harrel Lemon)  0.005 % ophthalmic solution Place 1 drop into both eyes at bedtime.    Yes Historical Provider, MD  Omega-3 Fatty Acids (FISH OIL PO) Take 1 capsule by mouth 2 (two) times daily.   Yes Historical Provider, MD  donepezil (ARICEPT) 10 MG tablet Take 1 tablet (10 mg total) by mouth at bedtime. Patient not taking: Reported on 05/14/2015 01/06/15   Anson Fret, MD  levothyroxine (SYNTHROID, LEVOTHROID) 137 MCG tablet Take 137 mcg by mouth daily. 12/19/14   Historical Provider, MD   naproxen sodium (ANAPROX) 220 MG tablet Take 220 mg by mouth 2 (two) times daily with a meal.    Historical Provider, MD  orphenadrine (NORFLEX) 100 MG tablet Take 100 mg by mouth 2 (two) times daily. 01/02/15   Historical Provider, MD  simvastatin (ZOCOR) 40 MG tablet Take 40 mg by mouth daily.    Historical Provider, MD  timolol (BETIMOL) 0.5 % ophthalmic solution 1 drop 2 (two) times daily.    Historical Provider, MD   BP 132/61 mmHg  Pulse 63  Temp(Src) 97.4 F (36.3 C) (Oral)  Resp 16  SpO2 100% Physical Exam  Constitutional: She is oriented to person, place, and time. She appears well-developed and well-nourished.  HENT:  Head: Normocephalic and atraumatic.  Eyes: Conjunctivae are normal. Right eye exhibits no discharge.  Neck: Neck supple.  Cardiovascular: Normal rate, regular rhythm and normal heart sounds.   No murmur heard. Pulmonary/Chest: Effort normal and breath sounds normal. She has no wheezes. She has no rales.  Abdominal: Soft. She exhibits no distension. There is tenderness.  Tenderness to left upper and left lower quadrant.  Musculoskeletal: Normal range of motion. She exhibits no edema.  Neurological: She is oriented to person, place, and time. No cranial nerve deficit.  Skin: Skin is warm and dry. No rash noted. She is not diaphoretic.  Psychiatric: She has a normal mood and affect.  Nursing note and vitals reviewed.   ED Course  Procedures (including critical care time) Labs Review Labs Reviewed  CBC WITH DIFFERENTIAL/PLATELET - Abnormal; Notable for the following:    HCT 35.7 (*)    All other components within normal limits  COMPREHENSIVE METABOLIC PANEL - Abnormal; Notable for the following:    Potassium 3.4 (*)    Glucose, Bld 138 (*)    BUN 25 (*)    All other components within normal limits  LIPASE, BLOOD - Abnormal; Notable for the following:    Lipase 331 (*)    All other components within normal limits  URINALYSIS, ROUTINE W REFLEX  MICROSCOPIC (NOT AT [facility]) - Abnormal; Notable for the following:    APPearance TURBID (*)    Leukocytes, UA SMALL (*)    All other components within normal limits  URINE MICROSCOPIC-ADD ON - Abnormal; Notable for the following:    Squamous Epithelial / LPF 0-5 (*)    Bacteria, UA RARE (*)    All other components within normal limits  URINE CULTURE  I-STAT TROPOININ, ED  I-STAT CG4 LACTIC ACID, ED  I-STAT CG4 LACTIC ACID, ED    Imaging Review Ct Abdomen Pelvis W Contrast  05/14/2015  CLINICAL DATA:  Three-day history of left upper quadrant pain EXAM: CT ABDOMEN AND PELVIS WITH CONTRAST TECHNIQUE: Multidetector CT imaging of the abdomen and pelvis was performed using the standard protocol following bolus administration of intravenous contrast. Oral contrast was also administered. CONTRAST:  50mL OMNIPAQUE IOHEXOL 300 MG/ML SOLN, OMNIPAQUE IOHEXOL 300 MG/ML SOLN COMPARISON:  None.  FINDINGS: Lower chest:  Lung bases are clear. Hepatobiliary: No focal liver lesions are appreciable. Gallbladder is absent. Common bile duct measures 10 mm which is extreme upper normal for post cholecystectomy state. No biliary duct mass or calculus is appreciable. Pancreas: No pancreatic mass is seen. There is equivocal mesenteric thickening immediately adjacent to the pancreas. A small amount of fluid is seen slightly posteriorly and inferior to the pancreas extending to the left lateral conal fascia, likely secondary to a degree of acute pancreatitis. There is no pancreatic pseudocyst or duct dilatation. Spleen: No splenic lesions are identified. There are 3 small calcified splenic artery aneurysms, largest measuring 1.3 x 1.3 cm. Adrenals/Urinary Tract: Adrenals bilaterally appear normal. Kidneys bilaterally show no demonstrable mass or hydronephrosis. No renal or ureteral calculus is appreciable. Urinary bladder is midline with normal wall thickness. Stomach/Bowel: There are occasional sigmoid diverticula without  diverticulitis. There is no bowel obstruction. No free air or portal venous air. Vascular/Lymphatic: There is atherosclerotic calcification throughout the aorta and proximal common iliac arteries. There is no abdominal aortic aneurysm. Major mesenteric arteries appear patent. There is no demonstrable adenopathy in the abdomen or pelvis. Reproductive: Small calcifications in the uterus probably represent mild leiomyomatous change. There is no pelvic mass or pelvic fluid collection. Other: Appendix absent. No periappendiceal region inflammation. No abscess or ascites in the abdomen or pelvis. Musculoskeletal: There is degenerative change throughout the lower thoracic and lumbar spine regions. There is anterior wedging of the L5 vertebral body, age uncertain. There is slight anterolisthesis of L3 on L4, felt to be due to underlying spondylosis. There are no blastic or lytic bone lesions. There is no intramuscular or abdominal wall lesion. IMPRESSION: There is a small amount of fluid tracking posteriorly and inferiorly to the pancreas to the level of the left lateral conal fascia. There is subtle mesenteric thickening in the region of the body of the pancreas. These findings are felt to represent a degree of acute pancreatitis. There is no pancreatic mass or pseudocyst. The pancreatic duct is not dilated. The overall contour of the pancreas is not altered. Small calcified splenic artery aneurysms, not felt to have clinical significance in this age group. Gallbladder absent. Common bile duct is extreme upper normal in size for postcholecystectomy state. No mass or calculus is seen in the biliary ductal system. Appendix absent. Small calcifications in the uterus are likely due to leiomyomatous change. No dominant uterine mass appreciable. Extensive atherosclerotic calcification throughout the aorta and common iliac arteries. Occasional sigmoid diverticula without diverticulitis. Age uncertain anterior wedging of the L5  vertebral body. Mild spondylolisthesis at L3-4, likely due to underlying spondylosis. Electronically Signed   By: Bretta BangWilliam  Woodruff III M.D.   On: 05/14/2015 10:17   I have personally reviewed and evaluated these images and lab results as part of my medical decision-making.   EKG Interpretation   Date/Time:  Thursday May 14 2015 07:02:28 EST Ventricular Rate:  69 PR Interval:    QRS Duration: 95 QT Interval:  430 QTC Calculation: 461 R Axis:   21 Text Interpretation:  no acute ischemia.  Confirmed by Kandis MannanMACKUEN, COURTNEY  [phone]([id number]) on 05/14/2015 7:09:55 AM      MDM   Final diagnoses:  None    Patient is a 81 year old female presenting with left upper quadrant pain. Patient reports this is going on since last night. She also reports that she had some type of pain yesterday but can't remember where. Patient has normal vital signs. Her physical exam is  significant for left upper quadrant tenderness. Patient has tenderness to palpation of this area. We'll get labs, including troponin given the location of pain. We'll get CT abdomen and pelvis with contrast to better evaluate the LUQ.  8:36 AM Patient;s son here. Elevated lipase, no ho drinking. Will continue with CT scan given age, no history of stone. Concern for pancreatic tumor.    10:32 AM Patient's CT shows no evidence of pancreatic mass. Will admit for pain medication, antiemetics, and further evaluation for causes of pancreatitis given that she is a nondrinker, no gallstones.    Damonica Chopra Randall An, MD 05/14/15 [phone]

## 2015-05-14 NOTE — ED Notes (Addendum)
EKG given to EDP,Pheiffer,MD., for review. 

## 2015-05-14 NOTE — ED Notes (Signed)
Report given to Martha,RN.

## 2015-05-15 DIAGNOSIS — R0789 Other chest pain: Secondary | ICD-10-CM | POA: Diagnosis not present

## 2015-05-15 DIAGNOSIS — R413 Other amnesia: Secondary | ICD-10-CM | POA: Diagnosis not present

## 2015-05-15 DIAGNOSIS — K8500 Idiopathic acute pancreatitis without necrosis or infection: Secondary | ICD-10-CM | POA: Diagnosis not present

## 2015-05-15 LAB — BASIC METABOLIC PANEL
ANION GAP: 8 (ref 5–15)
BUN: 15 mg/dL (ref 6–20)
CALCIUM: 8.8 mg/dL — AB (ref 8.9–10.3)
CO2: 22 mmol/L (ref 22–32)
CREATININE: 0.78 mg/dL (ref 0.44–1.00)
Chloride: 108 mmol/L (ref 101–111)
GFR calc non Af Amer: >60 mL/min (ref >60)
Glucose, Bld: 114 mg/dL — ABNORMAL HIGH (ref 65–99)
Potassium: 4 mmol/L (ref 3.5–5.1)
SODIUM: 138 mmol/L (ref 135–145)

## 2015-05-15 LAB — GLUCOSE, CAPILLARY
GLUCOSE-CAPILLARY: 123 mg/dL — AB (ref 65–99)
GLUCOSE-CAPILLARY: 99 mg/dL (ref 65–99)
Glucose-Capillary: 101 mg/dL — ABNORMAL HIGH (ref 65–99)
Glucose-Capillary: 105 mg/dL — ABNORMAL HIGH (ref 65–99)
Glucose-Capillary: 119 mg/dL — ABNORMAL HIGH (ref 65–99)
Glucose-Capillary: 87 mg/dL (ref 65–99)

## 2015-05-15 LAB — URINE CULTURE

## 2015-05-15 LAB — LIPID PANEL
Cholesterol: 123 mg/dL (ref 0–200)
HDL: 38 mg/dL — AB (ref >40)
LDL CALC: 65 mg/dL (ref 0–99)
TRIGLYCERIDES: 100 mg/dL (ref <150)
Total CHOL/HDL Ratio: 3.2 RATIO
VLDL: 20 mg/dL (ref 0–40)

## 2015-05-15 LAB — LIPASE, BLOOD: Lipase: 100 U/L — ABNORMAL HIGH (ref 11–51)

## 2015-05-15 LAB — TROPONIN I: Troponin I: <0.03 ng/mL (ref <0.031)

## 2015-05-15 MED ORDER — PANTOPRAZOLE SODIUM 40 MG PO TBEC
40.0000 mg | DELAYED_RELEASE_TABLET | Freq: Every day | ORAL | Status: DC
  Administered 2015-05-15 – 2015-05-17 (×2): 40 mg via ORAL
  Filled 2015-05-15 (×3): qty 1

## 2015-05-15 MED ORDER — ALUM & MAG HYDROXIDE-SIMETH 200-200-20 MG/5ML PO SUSP
30.0000 mL | Freq: Once | ORAL | Status: AC
  Administered 2015-05-15: 30 mL via ORAL
  Filled 2015-05-15: qty 30

## 2015-05-15 MED ORDER — MORPHINE SULFATE (PF) 2 MG/ML IV SOLN
1.0000 mg | Freq: Once | INTRAVENOUS | Status: AC
  Administered 2015-05-15: 1 mg via INTRAVENOUS
  Filled 2015-05-15: qty 1

## 2015-05-15 MED ORDER — NITROGLYCERIN 0.4 MG SL SUBL
0.4000 mg | SUBLINGUAL_TABLET | SUBLINGUAL | Status: DC | PRN
Start: 2015-05-15 — End: 2015-05-17
  Administered 2015-05-15: 0.4 mg via SUBLINGUAL
  Filled 2015-05-15: qty 1

## 2015-05-15 MED ORDER — GI COCKTAIL ~~LOC~~
30.0000 mL | Freq: Three times a day (TID) | ORAL | Status: DC | PRN
  Administered 2015-05-15 – 2015-05-17 (×2): 30 mL via ORAL
  Filled 2015-05-15 (×4): qty 30

## 2015-05-15 MED ORDER — METOCLOPRAMIDE HCL 5 MG/ML IJ SOLN
5.0000 mg | Freq: Once | INTRAMUSCULAR | Status: AC
  Administered 2015-05-15: 5 mg via INTRAVENOUS
  Filled 2015-05-15: qty 2

## 2015-05-15 NOTE — Progress Notes (Signed)
Pt continues to c/o chest pain. MD notified. PRN meds given. Pt became nauseous, PRN given. Pt states "pain is better but not normal." will continue to monitor.

## 2015-05-15 NOTE — Progress Notes (Signed)
[facility] PROGRESS NOTE  Colleen Huang ZOX:[id number] DOB: 1924/01/03 DOA: 05/14/2015 PCP: [name], MD  Assessment/Plan:  Principal problem Acute pancreatitis Etiology unclear. Status post cholecystectomy several years ago. LFTs normal. No new medications. The only potential medications that are high risk of causing it is statin and fibrates. I have held both of them. Lipid panel normal. Given her age and normal lipid panel I think she can be taken off lipid-lowering agents completely. -Symptoms improved this morning so started on clear liquid. Lipase trending down. -Supportive care with IV fluids, pain medications and antiemetics.  Active Problems:  Chest pain on 12/2 Patient reported acute substernal chest pain this morning, shortly after taking some clears. Given a dose of fentanyl without relief. EKG unremarkable. He was then given a dose of IV morphine 1 mg and sublingual nitrate. Symptoms much better after that for dry heaving. Order for Maalox and a dose of IV Reglan. Troponin negative. Doubt cardiac etiology. Likely underlying gastritis versus worsening pancreatitis symptoms. Added PPI . Will monitor.  UTI Patient started on ciprofloxacin by her PCP one day prior to admission. Follow urine culture. Continue cipro for now.   Type 2 diabetes mellitus Diet-controlled. Stable.  essential hypertension Continue amlodipine    Memory loss As per son this is gradually progressive with forgetfulness. Patient is alert and well oriented during my conversation. She is independent of most of her ADLs at baseline. Was seen by neurologist as outpatient and has been placed on Aricept.     HLD (hyperlipidemia) Lipid Panel normal. Discontinued both statin and fibrate.  Hypothyroidism Continue Synthroid  Hypokalemia Replenished   Diet: Clear liquid  DVT prophylaxis: sq lovenox   Code Status: full code Family Communication: discussed with  daughter at  bedside Disposition Plan: Continue to monitor. Possibly home in 1-2 days if improved.   Antibiotics:  Cipro  HPI/Subjective: Seen and examined. Abdominal pain much improved but later during the day due to sharp substernal chest pain with nausea. EKG unremarkable troponin negative. Showed improvement with a dose of IV morphine and sublingual nitrate.   Objective: Filed Vitals:   05/15/15 0500 05/15/15 1250  BP: 149/47 154/83  Pulse: 62 80  Temp: 98 F (36.7 C)   Resp: 18     Intake/Output Summary (Last 24 hours) at 05/15/15 1501 Last data filed at 05/15/15 0910  Gross per 24 hour  Intake   1975 ml  Output      0 ml  Net   1975 ml   Filed Weights   05/14/15 1226  Weight: 73.4 kg (161 lb 13.1 oz)    Exam:   General:  Elderly female some distress with chest pain  HEENT: No pallor, dry oral mucosa  Chest: Clear to auscultation bilaterally  CVS: Normal S1 and S2, no murmurs rub or gallop  GI: Soft, nondistended, mild left upper quadrant tenderness, bowel sounds present  Musculoskeletal: Warm, no edema  CNS: Alert and oriented  Data Reviewed: Basic Metabolic Panel:  Recent Labs Lab 05/14/15 0748 05/15/15 0540  NA 141 138  K 3.4* 4.0  CL 106 108  CO2 26 22  GLUCOSE 138* 114*  BUN 25* 15  CREATININE 0.82 0.78  CALCIUM 9.7 8.8*   Liver Function Tests:  Recent Labs Lab 05/14/15 0748  AST 20  ALT 17  ALKPHOS 53  BILITOT 0.8  PROT 7.1  ALBUMIN 3.9    Recent Labs Lab 05/14/15 0748 05/15/15 0540  LIPASE 331* 100*   No results for input(s):  AMMONIA in the last 168 hours. CBC:  Recent Labs Lab 05/14/15 0748  WBC 7.8  NEUTROABS 6.3  HGB 12.2  HCT 35.7*  MCV 87.5  PLT 326   Cardiac Enzymes:  Recent Labs Lab 05/15/15 1320  TROPONINI <0.03   BNP (last 3 results) No results for input(s): BNP in the last 8760 hours.  ProBNP (last 3 results) No results for input(s): PROBNP in the last 8760 hours.  CBG:  Recent Labs Lab  05/14/15 2020 05/15/15 0013 05/15/15 0419 05/15/15 0750 05/15/15 1227  GLUCAP 106* 119* 123* 101* 87    Recent Results (from the past 240 hour(s))  Urine culture     Status: None   Collection Time: 05/14/15  8:30 AM  Result Value Ref Range Status   Specimen Description URINE, CLEAN CATCH  Final   Special Requests NONE  Final   Culture   Final    MULTIPLE SPECIES PRESENT, SUGGEST RECOLLECTION [facility]    Report Status 05/15/2015 FINAL  Final     Studies: Ct Abdomen Pelvis W Contrast  05/14/2015  CLINICAL DATA:  Three-day history of left upper quadrant pain EXAM: CT ABDOMEN AND PELVIS WITH CONTRAST TECHNIQUE: Multidetector CT imaging of the abdomen and pelvis was performed using the standard protocol following bolus administration of intravenous contrast. Oral contrast was also administered. CONTRAST:  50mL OMNIPAQUE IOHEXOL 300 MG/ML SOLN, 100mL OMNIPAQUE IOHEXOL 300 MG/ML SOLN COMPARISON:  None. FINDINGS: Lower chest:  Lung bases are clear. Hepatobiliary: No focal liver lesions are appreciable. Gallbladder is absent. Common bile duct measures 10 mm which is extreme upper normal for post cholecystectomy state. No biliary duct mass or calculus is appreciable. Pancreas: No pancreatic mass is seen. There is equivocal mesenteric thickening immediately adjacent to the pancreas. A small amount of fluid is seen slightly posteriorly and inferior to the pancreas extending to the left lateral conal fascia, likely secondary to a degree of acute pancreatitis. There is no pancreatic pseudocyst or duct dilatation. Spleen: No splenic lesions are identified. There are 3 small calcified splenic artery aneurysms, largest measuring 1.3 x 1.3 cm. Adrenals/Urinary Tract: Adrenals bilaterally appear normal. Kidneys bilaterally show no demonstrable mass or hydronephrosis. No renal or ureteral calculus is appreciable. Urinary bladder is midline with normal wall thickness. Stomach/Bowel: There  are occasional sigmoid diverticula without diverticulitis. There is no bowel obstruction. No free air or portal venous air. Vascular/Lymphatic: There is atherosclerotic calcification throughout the aorta and proximal common iliac arteries. There is no abdominal aortic aneurysm. Major mesenteric arteries appear patent. There is no demonstrable adenopathy in the abdomen or pelvis. Reproductive: Small calcifications in the uterus probably represent mild leiomyomatous change. There is no pelvic mass or pelvic fluid collection. Other: Appendix absent. No periappendiceal region inflammation. No abscess or ascites in the abdomen or pelvis. Musculoskeletal: There is degenerative change throughout the lower thoracic and lumbar spine regions. There is anterior wedging of the L5 vertebral body, age uncertain. There is slight anterolisthesis of L3 on L4, felt to be due to underlying spondylosis. There are no blastic or lytic bone lesions. There is no intramuscular or abdominal wall lesion. IMPRESSION: There is a small amount of fluid tracking posteriorly and inferiorly to the pancreas to the level of the left lateral conal fascia. There is subtle mesenteric thickening in the region of the body of the pancreas. These findings are felt to represent a degree of acute pancreatitis. There is no pancreatic mass or pseudocyst. The pancreatic duct is not dilated.  The overall contour of the pancreas is not altered. Small calcified splenic artery aneurysms, not felt to have clinical significance in this age group. Gallbladder absent. Common bile duct is extreme upper normal in size for postcholecystectomy state. No mass or calculus is seen in the biliary ductal system. Appendix absent. Small calcifications in the uterus are likely due to leiomyomatous change. No dominant uterine mass appreciable. Extensive atherosclerotic calcification throughout the aorta and common iliac arteries. Occasional sigmoid diverticula without diverticulitis.  Age uncertain anterior wedging of the L5 vertebral body. Mild spondylolisthesis at L3-4, likely due to underlying spondylosis. Electronically Signed   By: Bretta Bang III M.D.   On: 05/14/2015 10:17    Scheduled Meds: . amLODipine  10 mg Oral QPM  . aspirin EC  81 mg Oral QPM  . cholecalciferol  400 Units Oral BID  . ciprofloxacin  400 mg Intravenous Q12H  . donepezil  10 mg Oral QHS  . enoxaparin (LOVENOX) injection  40 mg Subcutaneous Q24H  . insulin aspart  0-9 Units Subcutaneous 6 times per day  . latanoprost  1 drop Both Eyes QHS  . levothyroxine  137 mcg Oral QAC breakfast  . metoCLOPramide (REGLAN) injection  5 mg Intravenous Once  . omega-3 acid ethyl esters  1 g Oral Daily  . pantoprazole  40 mg Oral Daily   Continuous Infusions: . sodium chloride 0.9 % 1,000 mL with potassium chloride 40 mEq infusion 100 mL/hr at 05/15/15 1305      Time spent: 25 minutes    Almee Pelphrey  [facility] Pager [phone] If 7PM-7AM, please contact night-coverage at [email or web address], password [facility] 05/15/2015, 3:01 PM  LOS: 1 day

## 2015-05-15 NOTE — Care Management Note (Signed)
Case Management Note  Patient Details  Name: Colleen Huang MRN: [id number] Date of Birth: 11/10/1923  Subjective/Objective:                 Acute pancreatitis   Action/Plan:Date: May 15, 2015 Chart reviewed for concurrent status and case management needs. Will continue to follow patient for changes and needs: Marcelle Smilinghonda Jenelle Drennon, RN, BSN, [place]   [phone]   Expected Discharge Date:                  Expected Discharge Plan:  Home/Self Care  In-House Referral:  NA  Discharge planning Services  CM Consult  Post Acute Care Choice:  NA Choice offered to:  NA  DME Arranged:  N/A DME Agency:  NA  HH Arranged:  NA HH Agency:  NA  Status of Service:  In process, will continue to follow  Medicare Important Message Given:    Date Medicare IM Given:    Medicare IM give by:    Date Additional Medicare IM Given:    Additional Medicare Important Message give by:     If discussed at Long Length of Stay Meetings, dates discussed:    Additional Comments:  Golda AcreDavis, Ursala Cressy Lynn, RN 05/15/2015, 10:53 AM

## 2015-05-15 NOTE — Progress Notes (Signed)
Pt c/o sharp chest pain after eating Clears. MD notified, EKG and VSS. PRN meds administered for pain. Troponins drawn. Will continue to monitor.

## 2015-05-16 DIAGNOSIS — K8500 Idiopathic acute pancreatitis without necrosis or infection: Secondary | ICD-10-CM | POA: Diagnosis not present

## 2015-05-16 DIAGNOSIS — I10 Essential (primary) hypertension: Secondary | ICD-10-CM | POA: Diagnosis not present

## 2015-05-16 LAB — GLUCOSE, CAPILLARY
GLUCOSE-CAPILLARY: 117 mg/dL — AB (ref 65–99)
GLUCOSE-CAPILLARY: 70 mg/dL (ref 65–99)
GLUCOSE-CAPILLARY: 74 mg/dL (ref 65–99)
GLUCOSE-CAPILLARY: 99 mg/dL (ref 65–99)
Glucose-Capillary: 92 mg/dL (ref 65–99)
Glucose-Capillary: 91 mg/dL (ref 65–99)

## 2015-05-16 LAB — BASIC METABOLIC PANEL
Anion gap: 6 (ref 5–15)
BUN: 14 mg/dL (ref 6–20)
CHLORIDE: 107 mmol/L (ref 101–111)
CO2: 24 mmol/L (ref 22–32)
CREATININE: 0.77 mg/dL (ref 0.44–1.00)
Calcium: 8.5 mg/dL — ABNORMAL LOW (ref 8.9–10.3)
GFR calc Af Amer: >60 mL/min (ref >60)
GFR calc non Af Amer: >60 mL/min (ref >60)
GLUCOSE: 100 mg/dL — AB (ref 65–99)
Potassium: 4.4 mmol/L (ref 3.5–5.1)
SODIUM: 137 mmol/L (ref 135–145)

## 2015-05-16 LAB — LIPASE, BLOOD: Lipase: 25 U/L (ref 11–51)

## 2015-05-16 MED ORDER — FENTANYL CITRATE (PF) 100 MCG/2ML IJ SOLN: 12.5000 ug | INTRAMUSCULAR | Status: DC | PRN

## 2015-05-16 MED ORDER — KETOROLAC TROMETHAMINE 15 MG/ML IJ SOLN
15.0000 mg | Freq: Four times a day (QID) | INTRAMUSCULAR | Status: DC | PRN
Start: 2015-05-16 — End: 2015-05-17
  Administered 2015-05-16 – 2015-05-17 (×4): 15 mg via INTRAVENOUS
  Filled 2015-05-16 (×4): qty 1

## 2015-05-16 NOTE — Progress Notes (Signed)
[facility] PROGRESS NOTE  Colleen KaufmanMarion S Bitterman ZOX:[id number] DOB: 08/31/1923 DOA: 05/14/2015 PCP: Jackie PlumSEI-BONSU,GEORGE, MD  Brief narrative 81 year old female with hypertension, hyperlipidemia, diet-controlled diabetes mellitus, progressive dementia initially presented to PCP office for headache and elevated blood pressure, feeling dizzy. Was found to have UTI and discharged home on ciprofloxacin by PCP. During that night patient had severe left upper abdominal pain associated with severe episodes of vomiting and was brought to the hospital. Patient found to have elevated lipase with CT abdomen suggestive of acute pancreatitis.   Assessment/Plan:  Principal problem Acute pancreatitis Etiology unclear. Status post cholecystectomy several years ago. LFTs normal. No new medications. The only potential medications that are high risk of causing it is statin and fibrates and I have discontinued them. Lipid panel are normal. She does not need to be on them anymore. -Has mild left mid quadrant abdominal pain on exam. CT abdomen without signs of colitis. Will have clear liquid from this evening. Pain controlled with low-dose when necessary fentanyl (patient's daughter asking for it every 3 hours for upper back pain I have instructed to be given less frequent). Alternate with Tylenol and when necessary Toradol.  Active Problems:  Chest pain on 12/2 Possibly associated with gastritis. Responded well to Maalox. EKG and troponin negative. No further symptoms.  UTI Patient started on ciprofloxacin by her PCP one day prior to admission. cx growing multiple species. Continue cipro for now.   Type 2 diabetes mellitus Diet-controlled. Stable.  essential hypertension Continue amlodipine    Memory loss As per son this is gradually progressive with forgetfulness. Patient is alert and well oriented during my conversation. She is independent of most of her ADLs at baseline. Was seen by neurologist as  outpatient and has been placed on Aricept.     HLD (hyperlipidemia) Lipid Panel normal. Discontinued both statin and fibrate.  Hypothyroidism Continue Synthroid  Hypokalemia Replenished   Diet: Start clear liquid at supper  DVT prophylaxis: sq lovenox   Code Status: full code Family Communication:  daughter at bedside Disposition Plan: Continue to monitor. Possibly home in 2 days if improved   Antibiotics:  Cipro  HPI/Subjective: Seen and examined. Has some left mid quadrant abdominal pain. No further chest pain symptoms. Occasional nausea.   Objective: Filed Vitals:   05/15/15 2117 05/16/15 0634  BP: 116/42 118/46  Pulse: 62 66  Temp: 98 F (36.7 C) 98.7 F (37.1 C)  Resp: 18 18    Intake/Output Summary (Last 24 hours) at 05/16/15 1219 Last data filed at 05/16/15 [id number]  Gross per 24 hour  Intake   2945 ml  Output   2100 ml  Net    845 ml   Filed Weights   05/14/15 1226  Weight: 73.4 kg (161 lb 13.1 oz)    Exam:   General: Not in distress  HEENT: No pallor, dry oral mucosa  Chest: Clear to auscultation bilaterally  CVS: Normal S1 and S2, no murmurs rub or gallop  GI: Soft, nondistended, mild left mid quadrant tenderness, bowel sounds present  Musculoskeletal: Warm, no edema    Data Reviewed: Basic Metabolic Panel:  Recent Labs Lab 05/14/15 0748 05/15/15 0540 05/16/15 0924  NA 141 138 137  K 3.4* 4.0 4.4  CL 106 108 107  CO2 26 22 24   GLUCOSE 138* 114* 100*  BUN 25* 15 14  CREATININE 0.82 0.78 0.77  CALCIUM 9.7 8.8* 8.5*   Liver Function Tests:  Recent Labs Lab 05/14/15 0748  AST 20  ALT  17  ALKPHOS 53  BILITOT 0.8  PROT 7.1  ALBUMIN 3.9    Recent Labs Lab 05/14/15 0748 05/15/15 0540 05/16/15 0924  LIPASE 331* 100* 25   No results for input(s): AMMONIA in the last 168 hours. CBC:  Recent Labs Lab 05/14/15 0748  WBC 7.8  NEUTROABS 6.3  HGB 12.2  HCT 35.7*  MCV 87.5  PLT 326   Cardiac  Enzymes:  Recent Labs Lab 05/15/15 1320  TROPONINI <0.03   BNP (last 3 results) No results for input(s): BNP in the last 8760 hours.  ProBNP (last 3 results) No results for input(s): PROBNP in the last 8760 hours.  CBG:  Recent Labs Lab 05/15/15 1740 05/15/15 1945 05/16/15 0020 05/16/15 0405 05/16/15 0756  GLUCAP 99 105* 99 117* 92    Recent Results (from the past 240 hour(s))  Urine culture     Status: None   Collection Time: 05/14/15  8:30 AM  Result Value Ref Range Status   Specimen Description URINE, CLEAN CATCH  Final   Special Requests NONE  Final   Culture   Final    MULTIPLE SPECIES PRESENT, SUGGEST RECOLLECTION Performed at [facility]    Report Status 05/15/2015 FINAL  Final     Studies: No results found.  Scheduled Meds: . amLODipine  10 mg Oral QPM  . aspirin EC  81 mg Oral QPM  . cholecalciferol  400 Units Oral BID  . ciprofloxacin  400 mg Intravenous Q12H  . donepezil  10 mg Oral QHS  . enoxaparin (LOVENOX) injection  40 mg Subcutaneous Q24H  . insulin aspart  0-9 Units Subcutaneous 6 times per day  . latanoprost  1 drop Both Eyes QHS  . levothyroxine  137 mcg Oral QAC breakfast  . omega-3 acid ethyl esters  1 g Oral Daily  . pantoprazole  40 mg Oral Daily   Continuous Infusions: . sodium chloride 0.9 % 1,000 mL with potassium chloride 40 mEq infusion 100 mL/hr at 05/16/15 1114      Time spent: 25 minutes    Dayne Chait  [facility] Pager [phone] If 7PM-7AM, please contact night-coverage at [email or web address], password [facility] 05/16/2015, 12:19 PM  LOS: 2 days

## 2015-05-17 DIAGNOSIS — K8500 Idiopathic acute pancreatitis without necrosis or infection: Secondary | ICD-10-CM | POA: Diagnosis not present

## 2015-05-17 DIAGNOSIS — R413 Other amnesia: Secondary | ICD-10-CM | POA: Diagnosis not present

## 2015-05-17 DIAGNOSIS — K219 Gastro-esophageal reflux disease without esophagitis: Secondary | ICD-10-CM

## 2015-05-17 DIAGNOSIS — I10 Essential (primary) hypertension: Secondary | ICD-10-CM | POA: Diagnosis not present

## 2015-05-17 LAB — GLUCOSE, CAPILLARY
GLUCOSE-CAPILLARY: 68 mg/dL (ref 65–99)
GLUCOSE-CAPILLARY: 105 mg/dL — AB (ref 65–99)
Glucose-Capillary: 75 mg/dL (ref 65–99)
Glucose-Capillary: 82 mg/dL (ref 65–99)
Glucose-Capillary: 97 mg/dL (ref 65–99)

## 2015-05-17 MED ORDER — CIPROFLOXACIN HCL 500 MG PO TABS
500.0000 mg | ORAL_TABLET | Freq: Two times a day (BID) | ORAL | Status: DC
  Filled 2015-05-17 (×2): qty 1

## 2015-05-17 MED ORDER — IBUPROFEN 600 MG PO TABS: 600.0000 mg | ORAL_TABLET | Freq: Three times a day (TID) | ORAL | Status: DC | PRN

## 2015-05-17 MED ORDER — CIPROFLOXACIN HCL 500 MG PO TABS
500.0000 mg | ORAL_TABLET | Freq: Once | ORAL | Status: AC
  Administered 2015-05-17: 500 mg via ORAL
  Filled 2015-05-17: qty 1

## 2015-05-17 MED ORDER — DOCUSATE SODIUM 100 MG PO CAPS: 100.0000 mg | ORAL_CAPSULE | Freq: Every day | ORAL | Status: DC | PRN

## 2015-05-17 MED ORDER — DOCUSATE SODIUM 50 MG/5ML PO LIQD: 100.0000 mg | Freq: Every day | ORAL | Status: DC | PRN

## 2015-05-17 MED ORDER — ALUMINUM-MAGNESIUM-SIMETHICONE 200-200-20 MG/5ML PO SUSP: 30.0000 mL | Freq: Three times a day (TID) | ORAL | Status: DC

## 2015-05-17 NOTE — Progress Notes (Signed)
Pt left at this time with her daughter, daughter-in-law, and nephew. Pt alert, oriented, and without c/o. Discharge instructions/prescriptions given/explained with pt and family verbalizing understanding. Followup appointments noted.

## 2015-05-17 NOTE — Care Management Important Message (Signed)
Important Message  Patient Details  Name: Colleen Huang MRN: [id number] Date of Birth: 01/18/1924   Medicare Important Message Given:  Yes    [name], Angelita Harnack Ellen, RN 05/17/2015, 12:14 PM

## 2015-05-17 NOTE — Discharge Instructions (Addendum)
Acute Pancreatitis Acute pancreatitis is a disease in which the pancreas becomes suddenly inflamed. The pancreas is a large gland located behind your stomach. The pancreas produces enzymes that help digest food. The pancreas also releases the hormones glucagon and insulin that help regulate blood sugar. Damage to the pancreas occurs when the digestive enzymes from the pancreas are activated and begin attacking the pancreas before being released into the intestine. Most acute attacks last a couple of days and can cause serious complications. Some people become dehydrated and develop low blood pressure. In severe cases, bleeding into the pancreas can lead to shock and can be life-threatening. The lungs, heart, and kidneys may fail. CAUSES  Pancreatitis can happen to anyone. In some cases, the cause is unknown. Most cases are caused by:  Alcohol abuse.  Gallstones. Other less common causes are:  Certain medicines.  Exposure to certain chemicals.  Infection.  Damage caused by an accident (trauma).  Abdominal surgery. SYMPTOMS   Pain in the upper abdomen that may radiate to the back.  Tenderness and swelling of the abdomen.  Nausea and vomiting. DIAGNOSIS  Your caregiver will perform a physical exam. Blood and stool tests may be done to confirm the diagnosis. Imaging tests may also be done, such as X-rays, CT scans, or an ultrasound of the abdomen. TREATMENT  Treatment usually requires a stay in the hospital. Treatment may include:  Pain medicine.  Fluid replacement through an intravenous line (IV).  Placing a tube in the stomach to remove stomach contents and control vomiting.  Not eating for 3 or 4 days. This gives your pancreas a rest, because enzymes are not being produced that can cause further damage.  Antibiotic medicines if your condition is caused by an infection.  Surgery of the pancreas or gallbladder. HOME CARE INSTRUCTIONS   Follow the diet advised by your  caregiver. This may involve avoiding alcohol and decreasing the amount of fat in your diet.  Eat smaller, more frequent meals. This reduces the amount of digestive juices the pancreas produces.  Drink enough fluids to keep your urine clear or pale yellow.  Only take over-the-counter or prescription medicines as directed by your caregiver.  Avoid drinking alcohol if it caused your condition.  Do not smoke.  Get plenty of rest.  Check your blood sugar at home as directed by your caregiver.  Keep all follow-up appointments as directed by your caregiver. SEEK MEDICAL CARE IF:   You do not recover as quickly as expected.  You develop new or worsening symptoms.  You have persistent pain, weakness, or nausea.  You recover and then have another episode of pain. SEEK IMMEDIATE MEDICAL CARE IF:   You are unable to eat or keep fluids down.  Your pain becomes severe.  You have a fever or persistent symptoms for more than 2 to 3 days.  You have a fever and your symptoms suddenly get worse.  Your skin or the white part of your eyes turn yellow (jaundice).  You develop vomiting.  You feel dizzy, or you faint.  Your blood sugar is high (over 300 mg/dL). MAKE SURE YOU:   Understand these instructions.  Will watch your condition.  Will get help right away if you are not doing well or get worse.   This information is not intended to replace advice given to you by your health care provider. Make sure you discuss any questions you have with your health care provider.   Document Released: 05/30/2005 Document Revised: 11/29/2011  Document Reviewed: 09/08/2011 Elsevier Interactive Patient Education 2016 Elsevier Inc.   Low-Fat Diet for Pancreatitis or Gallbladder Conditions A low-fat diet can be helpful if you have pancreatitis or a gallbladder condition. With these conditions, your pancreas and gallbladder have trouble digesting fats. A healthy eating plan with less fat will help  rest your pancreas and gallbladder and reduce your symptoms. WHAT DO I NEED TO KNOW ABOUT THIS DIET?  Eat a low-fat diet.  Reduce your fat intake to less than 20-30% of your total daily calories. This is less than 50-60 g of fat per day.  Remember that you need some fat in your diet. Ask your dietician what your daily goal should be.  Choose nonfat and low-fat healthy foods. Look for the words "nonfat," "low fat," or "fat free."  As a guide, look on the label and choose foods with less than 3 g of fat per serving. Eat only one serving.  Avoid alcohol.  Do not smoke. If you need help quitting, talk with your health care provider.  Eat small frequent meals instead of three large heavy meals. WHAT FOODS CAN I EAT? Grains Include healthy grains and starches such as potatoes, wheat bread, fiber-rich cereal, and brown rice. Choose whole grain options whenever possible. In adults, whole grains should account for 45-65% of your daily calories.  Fruits and Vegetables Eat plenty of fruits and vegetables. Fresh fruits and vegetables add fiber to your diet. Meats and Other Protein Sources Eat lean meat such as chicken and pork. Trim any fat off of meat before cooking it. Eggs, fish, and beans are other sources of protein. In adults, these foods should account for 10-35% of your daily calories. Dairy Choose low-fat milk and dairy options. Dairy includes fat and protein, as well as calcium.  Fats and Oils Limit high-fat foods such as fried foods, sweets, baked goods, sugary drinks.  Other Creamy sauces and condiments, such as mayonnaise, can add extra fat. Think about whether or not you need to use them, or use smaller amounts or low fat options. WHAT FOODS ARE NOT RECOMMENDED?  High fat foods, such as:  [employer]aked goods.  Ice cream.  [place] toast.  Sweet rolls.  Pizza.  Cheese bread.  Foods covered with batter, butter, creamy sauces, or cheese.  Fried foods.  Sugary drinks and  desserts.  Foods that cause gas or bloating   This information is not intended to replace advice given to you by your health care provider. Make sure you discuss any questions you have with your health care provider.   Document Released: 06/04/2013 Document Reviewed: 06/04/2013 Elsevier Interactive Patient Education 2016 Elsevier Inc.   Clear Liquid Diet A clear liquid diet is a short-term diet that is prescribed to provide the necessary fluid and basic energy you need when you can have nothing else. The clear liquid diet consists of liquids or solids that will become liquid at room temperature. You should be able to see through the liquid. There are many reasons that you may be restricted to clear liquids, such as:  When you have a sudden-onset (acute) condition that occurs before or after surgery.  To help your body slowly get adjusted to food again after a long period when you were unable to have food.  Replacement of fluids when you have a diarrheal disease.  When you are going to have certain exams, such as a colonoscopy, in which instruments are inserted inside your body to look at parts of your digestive system.  WHAT CAN I HAVE? A clear liquid diet does not provide all the nutrients you need. It is important to choose a variety of the following items to get as many nutrients as possible:  Vegetable juices that do not have pulp.  Fruit juices and fruit drinks that do not have pulp.  Coffee (regular or decaffeinated), tea, or soda at the discretion of your health care provider.  Clear bouillon, broth, or strained broth-based soups.  High-protein and flavored gelatins.  Sugar or honey.  Ices or frozen ice pops that do not contain milk. If you are not sure whether you can have certain items, you should ask your health care provider. You may also ask your health care provider if there are any other clear liquid options.   This information is not intended to replace advice  given to you by your health care provider. Make sure you discuss any questions you have with your health care provider.   Document Released: 05/30/2005 Document Revised: 06/04/2013 Document Reviewed: 04/26/2013 Elsevier Interactive Patient Education [employer]

## 2015-05-17 NOTE — Care Management Note (Signed)
Case Management Note  Patient Details  Name: Colleen KaufmanMarion S Huang MRN: [id number] Date of Birth: 04/04/1924  Subjective/Objective:     Acute Pancreatitis               Action/Plan: NCM spoke to pt's dtr, [name], [phone]. States pt lives at home alone. No DME needed at this time. [facility] Dtr states they have a family friend that has an aide service that they were thinking about hiring an aide to come in to assist pt at home. Pt goal is to stay in her home. NCM explained that long term plans for pt will need to be discussed. Inquired about Long Term Care police or VA benefits. Dtr states she will check to see if her pt had any benefits. Her husband was in the [employer] and pt may have [place] benefits. Contacted [facility] rep with new referral.    Expected Discharge Date:  05/17/2015              Expected Discharge Plan:  Home w Home Health Services  In-House Referral:     Discharge planning Services  CM Consult  Post Acute Care Choice:  Home Health Choice offered to:  NA, Adult Children   HH Arranged:  RN HH Agency:  [facility]  Status of Service:  Completed, signed off  Medicare Important Message Given:  Yes Date Medicare IM Given:    Medicare IM give by:    Date Additional Medicare IM Given:    Additional Medicare Important Message give by:     If discussed at Long Length of Stay Meetings, dates discussed:    Additional Comments:  [name], [name], RN 05/17/2015, 1:01 PM

## 2015-05-17 NOTE — Progress Notes (Signed)
PHARMACIST - PHYSICIAN COMMUNICATION DR:   Dhungel CONCERNING: Antibiotic IV to Oral Route Change Policy  RECOMMENDATION: This patient is receiving ciprofloxacin by the intravenous route.  Based on criteria approved by the Pharmacy and Therapeutics Committee, the antibiotic(s) is/are being converted to the equivalent oral dose form(s).   DESCRIPTION: These criteria include:  Patient being treated for a respiratory tract infection, urinary tract infection, cellulitis or clostridium difficile associated diarrhea if on metronidazole  The patient is not neutropenic and does not exhibit a GI malabsorption state  The patient is eating (either orally or via tube) and/or has been taking other orally administered medications for a least 24 hours  The patient is improving clinically and has a Tmax < 100.5  If you have questions about this conversion, please contact the Pharmacy Department  []   [phone]( [phone] )  [facility] []   [phone]( [phone] )  [facility] []   [phone]( [phone] )  [facility] []   [phone]( [phone] )  [facility] [x]   [phone]( [phone] )  [facility]   Dorna LeitzAnh Moris Ratchford, PharmD, BCPS 05/17/2015 11:15 AM

## 2015-05-17 NOTE — Discharge Summary (Addendum)
Physician Discharge Summary  GLORIS SHIROMA ZOX:[id number] DOB: Jan 13, 1924 DOA: 05/14/2015  PCP: [name], MD  Admit date: 05/14/2015 Discharge date: 05/17/2015  Time spent: <30 minutes  Recommendations for Outpatient Follow-up:  1. Discharge home with PCP follow-up in 1 week. Ordered for home health RN.   Discharge Diagnoses:  Principal Problem:   Acute pancreatitis   Active Problems:   Memory loss   HTN (hypertension)   HLD (hyperlipidemia)   DM (diabetes mellitus), type 2 (HCC)   Dementia   GERD (gastroesophageal reflux disease)   Discharge Condition: fair  Diet recommendation: full liquid for next 2 days , then low fat diet  Filed Weights   05/14/15 1226  Weight: 73.4 kg (161 lb 13.1 oz)    History of present illness:  Please refer to admission H&P for details, in brief, 81 year old female with hypertension, hyperlipidemia, diet-controlled diabetes mellitus, progressive dementia initially presented to PCP office for headache and elevated blood pressure, feeling dizzy. Was found to have UTI and discharged home on ciprofloxacin by PCP. During that night patient had severe left upper abdominal pain associated with severe episodes of vomiting and was brought to the hospital. Patient found to have elevated lipase with CT abdomen suggestive of acute pancreatitis.  Hospital Course:  Acute pancreatitis Etiology unclear. Status post cholecystectomy several years ago. LFTs normal. No new medications. The only potential medications that are high risk of causing it is statin and fibrates and I have discontinued them. Lipid panel are normal. She does not need to be on them anymore. -Has mild left mid quadrant abdominal pain on exam. CT abdomen without signs of colitis. Lipase normalized. -Started on clear liquid diet advance to full liquid which she has tolerated well. Instructed on being on full liquid for next 2 days and then can advance to low-fat diet. Provided with  diet resource. Patient symptoms are markedly improved and can be discharged home with outpatient follow-up.  Active Problems:  Chest pain on 12/2 Possibly associated with gastritis. Responded well to Maalox. EKG and troponin negative. No further symptoms. Will prescribe her Maalox as needed.  UTI Patient started on ciprofloxacin by her PCP one day prior to admission. cx growing multiple species. Has received 5 days of Cipro already. Type 2 diabetes mellitus Diet-controlled. Stable.  essential hypertension Continue amlodipine    Memory loss As per son this is gradually progressive with forgetfulness. Patient is alert and well oriented during my conversation. She is independent of most of her ADLs at baseline. Was seen by neurologist as outpatient and has been placed on Aricept.     HLD (hyperlipidemia) Lipid Panel normal. Discontinued both statin and fibrate.  Hypothyroidism Continue Synthroid  Hypokalemia Replenished       Code Status: full code Family Communication: daughter at bedside Disposition Plan: Discharge home with home health RN with outpatient PCP follow-up   Antibiotics:  Cipro  Discharge Exam: Filed Vitals:   05/16/15 2113 05/17/15 0543  BP: 122/42 132/54  Pulse: 65 55  Temp: 98.4 F (36.9 C) 97.9 F (36.6 C)  Resp: 18 18     General: Elderly pleasant female Not in distress  HEENT: No pallor, dry oral mucosa  Chest: Clear to auscultation bilaterally  CVS: Normal S1 and S2, no murmurs rub or gallop  GI: Soft, nondistended, nontender, bowel sounds present  Musculoskeletal: Warm, no edema  Dennis: Alert and oriented Discharge Instructions    Current Discharge Medication List    START taking these medications   Details  aluminum-magnesium hydroxide-simethicone (MAALOX) 200-200-20 MG/5ML SUSP Take 30 mLs by mouth 4 (four) times daily -  before meals and at bedtime. Qty: 355 mL, Refills: 0    ibuprofen (ADVIL,MOTRIN) 600 MG  tablet Take 1 tablet (600 mg total) by mouth every 8 (eight) hours as needed. Qty: 15 tablet, Refills: 0      CONTINUE these medications which have NOT CHANGED   Details  amLODipine (NORVASC) 10 MG tablet Take 10 mg by mouth every evening.  Refills: 1    aspirin 81 MG tablet Take 81 mg by mouth every evening.     Calcium Carb-Cholecalciferol (CALCIUM 600 + D PO) Take 1 tablet by mouth 2 (two) times daily.     Cholecalciferol (VITAMIN D PO) Take 1 tablet by mouth 2 (two) times daily.    donepezil (ARICEPT) 10 MG tablet Take 1 tablet (10 mg total) by mouth at bedtime. Qty: 30 tablet, Refills: 11    latanoprost (XALATAN) 0.005 % ophthalmic solution Place 1 drop into both eyes at bedtime.     levothyroxine (SYNTHROID, LEVOTHROID) 137 MCG tablet Take 137 mcg by mouth daily. Refills: 11    Omega-3 Fatty Acids (FISH OIL PO) Take 1 capsule by mouth 2 (two) times daily.      STOP taking these medications     atorvastatin (LIPITOR) 20 MG tablet      ciprofloxacin (CIPRO) 500 MG tablet      fenofibrate (TRICOR) 48 MG tablet        Allergies  Allergen Reactions  . Codeine     Really bad reaction that's all she can remember   . Hydrocodone     Really bad reaction that's all she can remember   . Penicillins Itching, Swelling and Rash    Has patient had a PCN reaction causing immediate rash, facial/tongue/throat swelling, SOB or lightheadedness with hypotension: Yes  Has patient had a PCN reaction causing severe rash involving mucus membranes or skin necrosis: Yes  Has patient had a PCN reaction that required hospitalization: No  Has patient had a PCN reaction occurring within the last 10 years: No  If all of the above answers are "NO", then may proceed with Cephalosporin use.   [name]. Percocet [Oxycodone-Acetaminophen]     unknown  . Sulfa Antibiotics     Unknown reaction    Follow-up Information    [facility] .   Why:  Home Health RN   Contact  information:   [phone]      Follow up with OSEI-BONSU,GEORGE, MD In 1 week.   Specialty:  Internal Medicine   Contact information:   [address] [place] [id number] [phone]        The results of significant diagnostics from this hospitalization (including imaging, microbiology, ancillary and laboratory) are listed below for reference.    Significant Diagnostic Studies: Ct Abdomen Pelvis W Contrast  05/14/2015  CLINICAL DATA:  Three-day history of left upper quadrant pain EXAM: CT ABDOMEN AND PELVIS WITH CONTRAST TECHNIQUE: Multidetector CT imaging of the abdomen and pelvis was performed using the standard protocol following bolus administration of intravenous contrast. Oral contrast was also administered. CONTRAST:  50mL OMNIPAQUE IOHEXOL 300 MG/ML SOLN, 100mL OMNIPAQUE IOHEXOL 300 MG/ML SOLN COMPARISON:  None. FINDINGS: Lower chest:  Lung bases are clear. Hepatobiliary: No focal liver lesions are appreciable. Gallbladder is absent. Common bile duct measures 10 mm which is extreme upper normal for post cholecystectomy state. No biliary duct mass  or calculus is appreciable. Pancreas: No pancreatic mass is seen. There is equivocal mesenteric thickening immediately adjacent to the pancreas. A small amount of fluid is seen slightly posteriorly and inferior to the pancreas extending to the left lateral conal fascia, likely secondary to a degree of acute pancreatitis. There is no pancreatic pseudocyst or duct dilatation. Spleen: No splenic lesions are identified. There are 3 small calcified splenic artery aneurysms, largest measuring 1.3 x 1.3 cm. Adrenals/Urinary Tract: Adrenals bilaterally appear normal. Kidneys bilaterally show no demonstrable mass or hydronephrosis. No renal or ureteral calculus is appreciable. Urinary bladder is midline with normal wall thickness. Stomach/Bowel: There are occasional sigmoid diverticula without diverticulitis. There is no bowel  obstruction. No free air or portal venous air. Vascular/Lymphatic: There is atherosclerotic calcification throughout the aorta and proximal common iliac arteries. There is no abdominal aortic aneurysm. Major mesenteric arteries appear patent. There is no demonstrable adenopathy in the abdomen or pelvis. Reproductive: Small calcifications in the uterus probably represent mild leiomyomatous change. There is no pelvic mass or pelvic fluid collection. Other: Appendix absent. No periappendiceal region inflammation. No abscess or ascites in the abdomen or pelvis. Musculoskeletal: There is degenerative change throughout the lower thoracic and lumbar spine regions. There is anterior wedging of the L5 vertebral body, age uncertain. There is slight anterolisthesis of L3 on L4, felt to be due to underlying spondylosis. There are no blastic or lytic bone lesions. There is no intramuscular or abdominal wall lesion. IMPRESSION: There is a small amount of fluid tracking posteriorly and inferiorly to the pancreas to the level of the left lateral conal fascia. There is subtle mesenteric thickening in the region of the body of the pancreas. These findings are felt to represent a degree of acute pancreatitis. There is no pancreatic mass or pseudocyst. The pancreatic duct is not dilated. The overall contour of the pancreas is not altered. Small calcified splenic artery aneurysms, not felt to have clinical significance in this age group. Gallbladder absent. Common bile duct is extreme upper normal in size for postcholecystectomy state. No mass or calculus is seen in the biliary ductal system. Appendix absent. Small calcifications in the uterus are likely due to leiomyomatous change. No dominant uterine mass appreciable. Extensive atherosclerotic calcification throughout the aorta and common iliac arteries. Occasional sigmoid diverticula without diverticulitis. Age uncertain anterior wedging of the L5 vertebral body. Mild  spondylolisthesis at L3-4, likely due to underlying spondylosis. Electronically Signed   By: Bretta Bang III M.D.   On: 05/14/2015 10:17    Microbiology: Recent Results (from the past 240 hour(s))  Urine culture     Status: None   Collection Time: 05/14/15  8:30 AM  Result Value Ref Range Status   Specimen Description URINE, CLEAN CATCH  Final   Special Requests NONE  Final   Culture   Final    MULTIPLE SPECIES PRESENT, SUGGEST RECOLLECTION Performed at [facility]    Report Status 05/15/2015 FINAL  Final     Labs: Basic Metabolic Panel:  Recent Labs Lab 05/14/15 0748 05/15/15 0540 05/16/15 0924  NA 141 138 137  K 3.4* 4.0 4.4  CL 106 108 107  CO2 GLUCOSE 138* 114* 100*  BUN 25* 15 14  CREATININE 0.82 0.78 0.77  CALCIUM 9.7 8.8* 8.5*   Liver Function Tests:  Recent Labs Lab 05/14/15 0748  AST 20  ALT 17  ALKPHOS 53  BILITOT 0.8  PROT 7.1  ALBUMIN 3.9    Recent Labs  Lab 05/14/15 0748 05/15/15 0540 05/16/15 0924  LIPASE 331* 100* 25   No results for input(s): AMMONIA in the last 168 hours. CBC:  Recent Labs Lab 05/14/15 0748  WBC 7.8  NEUTROABS 6.3  HGB 12.2  HCT 35.7*  MCV 87.5  PLT 326   Cardiac Enzymes:  Recent Labs Lab 05/15/15 1320  TROPONINI <0.03   BNP: BNP (last 3 results) No results for input(s): BNP in the last 8760 hours.  ProBNP (last 3 results) No results for input(s): PROBNP in the last 8760 hours.  CBG:  Recent Labs Lab 05/16/15 2007 05/17/15 0016 05/17/15 0407 05/17/15 0825 05/17/15 1104  GLUCAP 70 75 82 68 105*       Signed:  Lyric Hoar  [facility] 05/17/2015, 12:34 PM

## 2015-05-17 NOTE — Progress Notes (Signed)
Contacted [facility] to make aware HH RN, PT and aide needed for scheduled dc today. Isidoro DonningAlesia Bannie Lobban RN CCM Case Mgmt phone [phone]

## 2015-05-18 ENCOUNTER — Observation Stay (HOSPITAL_COMMUNITY)
Admission: EM | Admit: 2015-05-18 | Discharge: 2015-05-19 | Disposition: A | Discharge status: Skilled Nursing Facility | Payer: Medicare Other | Attending: Internal Medicine | Admitting: Internal Medicine

## 2015-05-18 ENCOUNTER — Encounter (HOSPITAL_COMMUNITY): Payer: Self-pay | Admitting: *Deleted

## 2015-05-18 ENCOUNTER — Emergency Department (HOSPITAL_COMMUNITY): Payer: Medicare Other

## 2015-05-18 DIAGNOSIS — E039 Hypothyroidism, unspecified: Secondary | ICD-10-CM | POA: Diagnosis not present

## 2015-05-18 DIAGNOSIS — Z9049 Acquired absence of other specified parts of digestive tract: Secondary | ICD-10-CM | POA: Insufficient documentation

## 2015-05-18 DIAGNOSIS — E038 Other specified hypothyroidism: Secondary | ICD-10-CM | POA: Diagnosis present

## 2015-05-18 DIAGNOSIS — Z7982 Long term (current) use of aspirin: Secondary | ICD-10-CM | POA: Insufficient documentation

## 2015-05-18 DIAGNOSIS — K5730 Diverticulosis of large intestine without perforation or abscess without bleeding: Secondary | ICD-10-CM | POA: Diagnosis not present

## 2015-05-18 DIAGNOSIS — R1013 Epigastric pain: Principal | ICD-10-CM | POA: Diagnosis present

## 2015-05-18 DIAGNOSIS — R079 Chest pain, unspecified: Secondary | ICD-10-CM | POA: Insufficient documentation

## 2015-05-18 DIAGNOSIS — E669 Obesity, unspecified: Secondary | ICD-10-CM | POA: Diagnosis not present

## 2015-05-18 DIAGNOSIS — E785 Hyperlipidemia, unspecified: Secondary | ICD-10-CM | POA: Insufficient documentation

## 2015-05-18 DIAGNOSIS — F0390 Unspecified dementia without behavioral disturbance: Secondary | ICD-10-CM | POA: Diagnosis not present

## 2015-05-18 DIAGNOSIS — Z79899 Other long term (current) drug therapy: Secondary | ICD-10-CM | POA: Insufficient documentation

## 2015-05-18 DIAGNOSIS — Z86010 Personal history of colonic polyps: Secondary | ICD-10-CM | POA: Insufficient documentation

## 2015-05-18 DIAGNOSIS — F419 Anxiety disorder, unspecified: Secondary | ICD-10-CM | POA: Insufficient documentation

## 2015-05-18 DIAGNOSIS — K219 Gastro-esophageal reflux disease without esophagitis: Secondary | ICD-10-CM | POA: Diagnosis not present

## 2015-05-18 DIAGNOSIS — E119 Type 2 diabetes mellitus without complications: Secondary | ICD-10-CM | POA: Diagnosis not present

## 2015-05-18 DIAGNOSIS — F411 Generalized anxiety disorder: Secondary | ICD-10-CM | POA: Diagnosis present

## 2015-05-18 DIAGNOSIS — I10 Essential (primary) hypertension: Secondary | ICD-10-CM | POA: Diagnosis present

## 2015-05-18 DIAGNOSIS — R110 Nausea: Secondary | ICD-10-CM

## 2015-05-18 LAB — CBC WITH DIFFERENTIAL/PLATELET
BASOS ABS: 0 10*3/uL (ref 0.0–0.1)
BASOS PCT: 0 %
EOS ABS: 0.2 10*3/uL (ref 0.0–0.7)
EOS PCT: 3 %
HEMATOCRIT: 35.5 % — AB (ref 36.0–46.0)
Hemoglobin: 11.7 g/dL — ABNORMAL LOW (ref 12.0–15.0)
Lymphocytes Relative: 16 %
Lymphs Abs: 1.1 10*3/uL (ref 0.7–4.0)
MCH: 29.2 pg (ref 26.0–34.0)
MCHC: 33 g/dL (ref 30.0–36.0)
MCV: 88.5 fL (ref 78.0–100.0)
MONO ABS: 0.8 10*3/uL (ref 0.1–1.0)
MONOS PCT: 11 %
Neutro Abs: 4.8 10*3/uL (ref 1.7–7.7)
Neutrophils Relative %: 70 %
PLATELETS: 311 10*3/uL (ref 150–400)
RBC: 4.01 MIL/uL (ref 3.87–5.11)
RDW: 13.2 % (ref 11.5–15.5)
WBC: 6.9 10*3/uL (ref 4.0–10.5)

## 2015-05-18 LAB — TROPONIN I: Troponin I: <0.03 ng/mL (ref <0.031)

## 2015-05-18 LAB — COMPREHENSIVE METABOLIC PANEL
ALBUMIN: 3.6 g/dL (ref 3.5–5.0)
ALT: 17 U/L (ref 14–54)
ANION GAP: 9 (ref 5–15)
AST: 21 U/L (ref 15–41)
Alkaline Phosphatase: 66 U/L (ref 38–126)
BILIRUBIN TOTAL: 0.9 mg/dL (ref 0.3–1.2)
BUN: 14 mg/dL (ref 6–20)
CHLORIDE: 108 mmol/L (ref 101–111)
CO2: 23 mmol/L (ref 22–32)
Calcium: 9.5 mg/dL (ref 8.9–10.3)
Creatinine, Ser: 0.74 mg/dL (ref 0.44–1.00)
GFR calc Af Amer: >60 mL/min (ref >60)
GFR calc non Af Amer: >60 mL/min (ref >60)
GLUCOSE: 100 mg/dL — AB (ref 65–99)
POTASSIUM: 4.3 mmol/L (ref 3.5–5.1)
Sodium: 140 mmol/L (ref 135–145)
TOTAL PROTEIN: 7 g/dL (ref 6.5–8.1)

## 2015-05-18 LAB — LIPASE, BLOOD: LIPASE: 33 U/L (ref 11–51)

## 2015-05-18 MED ORDER — ALUM & MAG HYDROXIDE-SIMETH 200-200-20 MG/5ML PO SUSP
30.0000 mL | Freq: Three times a day (TID) | ORAL | Status: DC
  Administered 2015-05-18 – 2015-05-19 (×5): 30 mL via ORAL
  Filled 2015-05-18 (×6): qty 30

## 2015-05-18 MED ORDER — ASPIRIN EC 81 MG PO TBEC
81.0000 mg | DELAYED_RELEASE_TABLET | Freq: Every evening | ORAL | Status: DC
  Administered 2015-05-18: 81 mg via ORAL
  Filled 2015-05-18: qty 1

## 2015-05-18 MED ORDER — AMLODIPINE BESYLATE 10 MG PO TABS
10.0000 mg | ORAL_TABLET | Freq: Every day | ORAL | Status: DC
  Administered 2015-05-19: 10 mg via ORAL
  Filled 2015-05-18: qty 1

## 2015-05-18 MED ORDER — SODIUM CHLORIDE 0.9 % IV SOLN
INTRAVENOUS | Status: DC
  Administered 2015-05-18 (×2): via INTRAVENOUS

## 2015-05-18 MED ORDER — OMEGA-3-ACID ETHYL ESTERS 1 G PO CAPS
1.0000 g | ORAL_CAPSULE | Freq: Every day | ORAL | Status: DC
  Administered 2015-05-18 – 2015-05-19 (×2): 1 g via ORAL
  Filled 2015-05-18 (×2): qty 1

## 2015-05-18 MED ORDER — DONEPEZIL HCL 5 MG PO TABS
10.0000 mg | ORAL_TABLET | Freq: Every day | ORAL | Status: DC
  Administered 2015-05-18: 10 mg via ORAL
  Filled 2015-05-18: qty 2

## 2015-05-18 MED ORDER — MORPHINE SULFATE (PF) 4 MG/ML IV SOLN
4.0000 mg | Freq: Once | INTRAVENOUS | Status: AC
  Administered 2015-05-18: 4 mg via INTRAVENOUS
  Filled 2015-05-18: qty 1

## 2015-05-18 MED ORDER — LEVOTHYROXINE SODIUM 25 MCG PO TABS
137.0000 ug | ORAL_TABLET | Freq: Every day | ORAL | Status: DC
  Administered 2015-05-18 – 2015-05-19 (×2): 137 ug via ORAL
  Filled 2015-05-18 (×2): qty 1

## 2015-05-18 MED ORDER — ALUM & MAG HYDROXIDE-SIMETH 200-200-20 MG/5ML PO SUSP: 30.0000 mL | Freq: Three times a day (TID) | ORAL | Status: DC

## 2015-05-18 MED ORDER — LORAZEPAM 2 MG/ML IJ SOLN
1.0000 mg | Freq: Once | INTRAMUSCULAR | Status: AC
  Administered 2015-05-18: 1 mg via INTRAVENOUS
  Filled 2015-05-18: qty 1

## 2015-05-18 MED ORDER — ONDANSETRON HCL 4 MG/2ML IJ SOLN
4.0000 mg | Freq: Once | INTRAMUSCULAR | Status: AC
Start: 2015-05-18 — End: 2015-05-18
  Administered 2015-05-18: 4 mg via INTRAVENOUS
  Filled 2015-05-18: qty 2

## 2015-05-18 MED ORDER — SODIUM CHLORIDE 0.9 % IJ SOLN: 3.0000 mL | Freq: Two times a day (BID) | INTRAMUSCULAR | Status: DC

## 2015-05-18 MED ORDER — SODIUM CHLORIDE 0.9 % IV SOLN
INTRAVENOUS | Status: DC
  Administered 2015-05-18: 08:00:00 via INTRAVENOUS

## 2015-05-18 MED ORDER — LATANOPROST 0.005 % OP SOLN
1.0000 [drp] | Freq: Every day | OPHTHALMIC | Status: DC
  Administered 2015-05-18: 1 [drp] via OPHTHALMIC
  Filled 2015-05-18: qty 2.5

## 2015-05-18 MED ORDER — ALUMINUM-MAGNESIUM-SIMETHICONE 200-200-20 MG/5ML PO SUSP
30.0000 mL | Freq: Three times a day (TID) | ORAL | Status: DC
  Filled 2015-05-18 (×35): qty 30

## 2015-05-18 NOTE — Progress Notes (Signed)
Initial Nutrition Assessment  DOCUMENTATION CODES:   Not applicable  INTERVENTION:  - RD will continue to monitor for needs - Provide pt with soft foods until dentures become available  NUTRITION DIAGNOSIS:   Biting/chewing difficulty related to other (see comment) (dentures not present) as evidenced by per patient/family report.  GOAL:   Patient will meet greater than or equal to 90% of their needs  MONITOR:   PO intake, Weight trends, Labs, Skin, I & O's  REASON FOR ASSESSMENT:   Consult Diet education  ASSESSMENT:   81 year old female with past medical history of dementia, hypothyroidism, dyslipidemia, hypertension, just recently hospitalized for acute pancreatitis and discharged 05/17/2015. She was discharged with instruction to continue full liquid diet for 2 days and advance to regular after that. Patient reported coming home and continuing to experience epigastric pain and chest pain. Patient reports she is not able to differentiate whether the pain starts in the chest or epigastric area but it is sharp, intermittent, 10 out of 10 in intensity at worst. Pain is present at rest and with movement. She was not able to advance the diet more than full liquid because of the pain. She has not tried any analgesics at home. No reports of nausea or vomiting. No reports of diarrhea or constipation.   Pt seen for consult. BMI indicates overweight status. Pt was d/c'ed yesterday evening and re-admitted this AM. Pt sleeping at time of RD visit and all information provided by son and daughter who are at bedside.  Yesterday before d/c pt consumed potato soup without issue. She did not eat dinner last night due to being tired and going to be around 8 PM. This AM pt ate grits and eggs and for lunch ate almost 100% of roast beef, mashed potatoes, and vegetable. Pt needs dentures and was unable to eat her salad due to dentures not being present. Family denies issues swallowing and she is able to  chew well without dentures as long as she takes her time; denies issues with chewing any items when dentures are available.   Pt has had a good appetite with no weight fluctuations recently, per family. Chart review indicates stable weight (161-168 lbs) since 01/06/15. No muscle or fat wasting noted at this time.   Medications reviewed. Labs reviewed; CBGs: 68-117 mg/dL.   Diet Order:  Diet Heart Room service appropriate?: Yes; Fluid consistency:: Thin  Skin:  Reviewed, no issues  Last BM:  PTA  Height:   Ht Readings from Last 1 Encounters:  05/18/15 5\' 4"  (1.626 m)    Weight:   Wt Readings from Last 1 Encounters:  05/18/15 163 lb 2.3 oz (74 kg)    Ideal Body Weight:  54.54 kg (kg)  BMI:  Body mass index is 27.99 kg/(m^2).  Estimated Nutritional Needs:   Kcal:  1400-1600  Protein:  55-65 grams  Fluid:  2 L/day  EDUCATION NEEDS:   No education needs identified at this time     Trenton GammonJessica Daruis Swaim, RD, LDN Inpatient Clinical Dietitian Pager # [phone] After hours/weekend pager # [phone]

## 2015-05-18 NOTE — H&P (Signed)
[facility] History and Physical  Colleen KaufmanMarion S Huang WGN:[id number] DOB: 10/31/1923 DOA: 05/18/2015  Referring physician: ER physician: Dr. Azalia BilisKevin Campos  PCP: Jackie PlumSEI-BONSU,GEORGE, MD  Chief Complaint: abdominal pain, chest pain   HPI:  81 year old female with past medical history of dementia, hypothyroidism, dyslipidemia, hypertension, just recently hospitalized for acute pancreatitis and discharged 05/17/2015. She was discharged with instruction to continue full liquid diet for 2 days and advance to regular after that. Patient reported coming home and continuing to experience epigastric pain and chest pain. Patient reports she is not able to differentiate whether the pain starts in the chest or epigastric area but it is sharp, intermittent, 10 out of 10 in intensity at worst. Pain is present at rest and with movement. She was not able to advance the diet more than full liquid because of the pain. She has not tried any analgesics at home. No reports of nausea or vomiting. No reports of diarrhea or constipation. No reports of blood in the stool. No reports of palpitations or shortness of breath. No falls.  In ED, patient is hemodynamically stable. Her blood is essentially unremarkable except for hemoglobin of 11.7. Troponin levels were within normal limits. The 12-lead EKG showed sinus rhythm. Chest x-ray showed no acute cardiopulmonary findings. She was admitted for evaluation of epigastric pain and chest pain rule out.  Assessment & Plan    Principal problem: Epigastric pain - Unclear etiology. Most certainly acute pancreatitis is again a possibility however lipase level is within normal limits. Liver enzymes are within normal limits. - No CT scan on this admission because she just had it done 05/14/2015 at which time it showed a degree of acute pancreatitis. - Consulted GI for input, appreciate their recommendations - We'll continue supportive care with IV fluids - Continue pain  management efforts.  Active Problems: Chest pain at rest - The 12-lead EKG showed sinus rhythm - The troponin level was within normal limits x 2 - Continue aspirin daily  Dementia without behavioral disturbance - Continue Aricept 10 mg at bedtime  Hypothyroidism - Continue Synthroid 137 g daily  Dyslipidemia - Continue omega-3 supplementation  Essential hypertension - Continue Norvasc 10 mg daily   DVT prophylaxis:  - SCD's bilaterally   Radiological Exams on Admission: Dg Chest Portable 1 View 05/18/2015  No active disease. Electronically Signed   By: Elgie CollardArash  Radparvar M.D.   On: 05/18/2015 05:30    EKG: I have personally reviewed EKG. EKG shows sinus rhythm   Code Status: Full Family Communication: Plan of care discussed with the patient  Disposition Plan: Admit for further evaluation, observation, telemetry   Dollie Bressi, Aniceto BossALMA, MD  [facility] Pager [phone]  Time spent in minutes: 55 minutes  Review of Systems:  Constitutional: Negative for fever, chills and malaise/fatigue. Negative for diaphoresis.  HENT: Negative for hearing loss, ear pain, nosebleeds, congestion, sore throat, neck pain, tinnitus and ear discharge.   Eyes: Negative for blurred vision, double vision, photophobia, pain, discharge and redness.  Respiratory: Negative for cough, hemoptysis, sputum production, shortness of breath, wheezing and stridor.   Cardiovascular: Negative for palpitations, orthopnea, claudication and leg swelling.  Gastrointestinal: Negative for nausea, vomiting and positive for abdominal pain. Negative for heartburn, constipation, blood in stool and melena.  Genitourinary: Negative for dysuria, urgency, frequency, hematuria and flank pain.  Musculoskeletal: Negative for myalgias, back pain, joint pain and falls.  Skin: Negative for itching and rash.  Neurological: Negative for dizziness and weakness. Negative for tingling, tremors, sensory change, speech  change, focal  weakness, loss of consciousness and headaches.  Endo/Heme/Allergies: Negative for environmental allergies and polydipsia. Does not bruise/bleed easily.  Psychiatric/Behavioral: Negative for suicidal ideas. The patient is not nervous/anxious.      Past Medical History  Diagnosis Date  . Hyperlipidemia   . Obesity   . Diabetes mellitus without complication [facility])    Past Surgical History  Procedure Laterality Date  . Appendectomy  1935  . Tonsillectomy  1949  . Parathyroidectomy / exploration of parathyroids  2003  . Gallbladder surgery  1997  . Colonoscopy  2005, 2009  . Flexible sigmoidoscopy  2005  . Rectal polypectomy  2005   Social History:  reports that she has never smoked. She does not have any smokeless tobacco history on file. She reports that she does not drink alcohol or use illicit drugs.  Allergies  Allergen Reactions  . Codeine     Really bad reaction that's all she can remember   . Hydrocodone     Really bad reaction that's all she can remember   . Penicillins Itching, Swelling and Rash    Has patient had a PCN reaction causing immediate rash, facial/tongue/throat swelling, SOB or lightheadedness with hypotension: Yes  Has patient had a PCN reaction causing severe rash involving mucus membranes or skin necrosis: Yes  Has patient had a PCN reaction that required hospitalization: No  Has patient had a PCN reaction occurring within the last 10 years: No  If all of the above answers are "NO", then may proceed with Cephalosporin use.   [name] Percocet [Oxycodone-Acetaminophen]     unknown  . Sulfa Antibiotics     Unknown reaction     Family History:  Family History  Problem Relation Age of Onset  . Dementia Brother      Prior to Admission medications   Medication Sig Start Date End Date Taking? Authorizing Provider  aluminum-magnesium hydroxide-simethicone (MAALOX) 200-200-20 MG/5ML SUSP Take 30 mLs by mouth 4 (four) times daily -  before meals and at bedtime.  05/17/15  Yes Nishant Dhungel, MD  amLODipine (NORVASC) 10 MG tablet Take 10 mg by mouth every evening.  10/09/14  Yes Historical Provider, MD  aspirin 81 MG tablet Take 81 mg by mouth every evening.    Yes Historical Provider, MD  Calcium Carb-Cholecalciferol (CALCIUM 600 + D PO) Take 1 tablet by mouth 2 (two) times daily.    Yes Historical Provider, MD  Cholecalciferol (VITAMIN D PO) Take 1 tablet by mouth 2 (two) times daily.   Yes Historical Provider, MD  donepezil (ARICEPT) 10 MG tablet Take 1 tablet (10 mg total) by mouth at bedtime. 02/10/15  Yes Anson Fret, MD  ibuprofen (ADVIL,MOTRIN) 600 MG tablet Take 1 tablet (600 mg total) by mouth every 8 (eight) hours as needed. 05/17/15  Yes Nishant Dhungel, MD  latanoprost (XALATAN) 0.005 % ophthalmic solution Place 1 drop into both eyes at bedtime.    Yes Historical Provider, MD  levothyroxine (SYNTHROID, LEVOTHROID) 137 MCG tablet Take 137 mcg by mouth daily. 12/19/14  Yes Historical Provider, MD  Omega-3 Fatty Acids (FISH OIL PO) Take 1 capsule by mouth 2 (two) times daily.   Yes Historical Provider, MD   Physical Exam: Filed Vitals:   05/18/15 0408 05/18/15 0411 05/18/15 0536 05/18/15 0600  BP:  126/47 107/51 91/48  Pulse:  69 55   Temp:  98.3 F (36.8 C)    TempSrc:  Oral    Resp:  SpO2:  99% 100% 100%     Physical Exam  Constitutional: Appears well-developed and well-nourished. No distress.  HENT: Normocephalic. No tonsillar erythema or exudates Eyes: Conjunctivae are normal. No scleral icterus.  Neck: Normal ROM. Neck supple. No JVD. No tracheal deviation. No thyromegaly.  CVS: RRR, S1/S2 appreciated.  Pulmonary: Effort and breath sounds normal, no stridor, rhonchi, wheezes, rales.  Abdominal: Soft. BS +,  no distension, tenderness, rebound or guarding.  Musculoskeletal: Normal range of motion. No edema and no tenderness.  Lymphadenopathy: No lymphadenopathy noted, cervical, inguinal. Neuro: Alert. Normal reflexes,  muscle tone coordination. No focal neurologic deficits. Skin: Skin is warm and dry. No rash noted.  No erythema. No pallor.  Psychiatric: Normal mood and affect. Behavior, judgment, thought content normal.   Labs on Admission:  Basic Metabolic Panel:  Recent Labs Lab 05/14/15 0748 05/15/15 0540 05/16/15 0924 05/18/15 0516  NA 141 138 137 140  K 3.4* 4.0 4.4 4.3  CL 106 108 107 108  CO2 GLUCOSE 138* 114* 100* 100*  BUN 25* CREATININE 0.82 0.78 0.77 0.74  CALCIUM 9.7 8.8* 8.5* 9.5   Liver Function Tests:  Recent Labs Lab 05/14/15 0748 05/18/15 0516  AST 20 21  ALT 17 17  ALKPHOS 53 66  BILITOT 0.8 0.9  PROT 7.1 7.0  ALBUMIN 3.9 3.6    Recent Labs Lab 05/14/15 0748 05/15/15 0540 05/16/15 0924 05/18/15 0516  LIPASE 331* 100* 25 33   No results for input(s): AMMONIA in the last 168 hours. CBC:  Recent Labs Lab 05/14/15 0748 05/18/15 0516  WBC 7.8 6.9  NEUTROABS 6.3 4.8  HGB 12.2 11.7*  HCT 35.7* 35.5*  MCV 87.5 88.5  PLT 326 311   Cardiac Enzymes:  Recent Labs Lab 05/15/15 1320 05/18/15 0516  TROPONINI <0.03 <0.03   BNP: Invalid input(s): POCBNP CBG:  Recent Labs Lab 05/17/15 0016 05/17/15 0407 05/17/15 0825 05/17/15 1104 05/17/15 1702  GLUCAP 75 82 68 105* 97    If 7PM-7AM, please contact night-coverage [email or web address] Password [email or web address] 05/18/2015, 7:26 AM

## 2015-05-18 NOTE — Consult Note (Signed)
Reason for Consult: Abdominal pain and chest pain. Referring Physician: [facility]-Dr. Leisa Lenz.  Colleen Huang is an 81 y.o. female.  HPI: 81 year old white female, well known to me, presented to the hospital with acute abdominal paina nd chest pain on 05/14/15 and a CT scan of the abdomen and pelvis revealed fluid around the pancreas and the Lipase was elevated at 331. She was sent home on a full liquid diet and her symptoms recurred requiring her to return to the hospital today after being discharged yesterday. She had a normal 12 lead EKG and labs on arrival to the ER. She has tolerated her diet well and plans are for her to be discharged tomorrow. On detailed discussion with her, she eats eggs and bacon every morning and has fried foods quite often. She lives alone and cooks for herself. She denies having any diarrhea or constipation, melena or henatochezia. She usually has 1 BM per day but has been constipated during this hospitalization with the pain  medications..  CT of the abdomen done on admission also reveals dilated bile ducts [patient is s/p cholecystectomy] and extensive aortic and iliac calcifications along with sigmoid diverticulosis.  Past medical history. Benign essential HTN Hypothyroidism Hyperlipidemia Dementia Colonic polyps-tubular adenomas Sigmoid diverticulosis  Past Surgical History  Procedure Laterality Date  . Appendectomy  1935  . Tonsillectomy  1949  . Parathyroidectomy / exploration of parathyroids  2003  .  Cholecystectomy  1997  . Colonoscopy  2005, 2009  . Flexible sigmoidoscopy  2005  . Colonoscopy with snare polypectomy  2005   Family History  Problem Relation Age of Onset  . Dementia Brother    Social History:  reports that she has never smoked. She does not have any smokeless tobacco history on file. She reports that she does not drink alcohol or use illicit drugs.  Allergies:  Allergies  Allergen Reactions  . Codeine     Really bad reaction  that's all she can remember   . Hydrocodone     Really bad reaction that's all she can remember   . Penicillins Itching, Swelling and Rash    Has patient had a PCN reaction causing immediate rash, facial/tongue/throat swelling, SOB or lightheadedness with hypotension: Yes  Has patient had a PCN reaction causing severe rash involving mucus membranes or skin necrosis: Yes  Has patient had a PCN reaction that required hospitalization: No  Has patient had a PCN reaction occurring within the last 10 years: No  If all of the above answers are "NO", then may proceed with Cephalosporin use.   [name] Percocet [Oxycodone-Acetaminophen]     unknown  . Sulfa Antibiotics     Unknown reaction    Medications: I have reviewed the patient's current medications.  Results for orders placed or performed during the hospital encounter of 05/18/15 (from the past 48 hour(s))  CBC with Differential/Platelet     Status: Abnormal   Collection Time: 05/18/15  5:16 AM  Result Value Ref Range   WBC 6.9 4.0 - 10.5 K/uL   RBC 4.01 3.87 - 5.11 MIL/uL   Hemoglobin 11.7 (L) 12.0 - 15.0 g/dL   HCT 35.5 (L) 36.0 - 46.0 %   MCV 88.5 78.0 - 100.0 fL   MCH 29.2 26.0 - 34.0 pg   MCHC 33.0 30.0 - 36.0 g/dL   RDW 13.2 11.5 - 15.5 %   Platelets 311 150 - 400 K/uL   Neutrophils Relative % 70 %   Neutro Abs 4.8  1.7 - 7.7 K/uL   Lymphocytes Relative 16 %   Lymphs Abs 1.1 0.7 - 4.0 K/uL   Monocytes Relative 11 %   Monocytes Absolute 0.8 0.1 - 1.0 K/uL   Eosinophils Relative 3 %   Eosinophils Absolute 0.2 0.0 - 0.7 K/uL   Basophils Relative 0 %   Basophils Absolute 0.0 0.0 - 0.1 K/uL  Comprehensive metabolic panel     Status: Abnormal   Collection Time: 05/18/15  5:16 AM  Result Value Ref Range   Sodium 140 135 - 145 mmol/L   Potassium 4.3 3.5 - 5.1 mmol/L   Chloride 108 101 - 111 mmol/L   CO2 23 22 - 32 mmol/L   Glucose, Bld 100 (H) 65 - 99 mg/dL   BUN 14 6 - 20 mg/dL   Creatinine, Ser 0.74 0.44 - 1.00 mg/dL   Calcium  9.5 8.9 - 10.3 mg/dL   Total Protein 7.0 6.5 - 8.1 g/dL   Albumin 3.6 3.5 - 5.0 g/dL   AST 21 15 - 41 U/L   ALT 17 14 - 54 U/L   Alkaline Phosphatase 66 38 - 126 U/L   Total Bilirubin 0.9 0.3 - 1.2 mg/dL   GFR calc non Af Amer >60 >60 mL/min   GFR calc Af Amer >60 >60 mL/min    Comment: (NOTE) The eGFR has been calculated using the CKD EPI equation. This calculation has not been validated in all clinical situations. eGFR's persistently <60 mL/min signify possible Chronic Kidney Disease.    Anion gap 9 5 - 15  Lipase, blood     Status: None   Collection Time: 05/18/15  5:16 AM  Result Value Ref Range   Lipase 33 11 - 51 U/L  Troponin I     Status: None   Collection Time: 05/18/15  5:16 AM  Result Value Ref Range   Troponin I <0.03 <0.031 ng/mL    Comment:        NO INDICATION OF MYOCARDIAL INJURY.   Troponin I (q 6hr x 3)     Status: None   Collection Time: 05/18/15  7:25 AM  Result Value Ref Range   Troponin I <0.03 <0.031 ng/mL    Comment:        NO INDICATION OF MYOCARDIAL INJURY.    Dg Chest Portable 1 View  05/18/2015  CLINICAL DATA:  81 year old female with mid chest pain. EXAM: PORTABLE CHEST 1 VIEW COMPARISON:  Chest radiograph dated 02/26/2004 FINDINGS: The heart size and mediastinal contours are within normal limits. Both lungs are clear. The visualized skeletal structures are unremarkable. IMPRESSION: No active disease. Electronically Signed   By: Anner Crete M.D.   On: 05/18/2015 05:30   Review of Systems  Constitutional: Negative.   HENT: Negative.   Eyes: Negative.   Respiratory: Negative.   Cardiovascular: Positive for chest pain.  Gastrointestinal: Positive for abdominal pain.  Genitourinary: Negative.   Neurological: Negative.   Endo/Heme/Allergies: Negative.   Psychiatric/Behavioral: Positive for memory loss. Negative for depression, suicidal ideas, hallucinations and substance abuse. The patient is nervous/anxious. The patient does not have  insomnia.    Blood pressure 120/48, pulse 61, temperature 98.2 F (36.8 C), temperature source Oral, resp. rate 19, height 5' 4" (1.626 m), weight 74 kg (163 lb 2.3 oz), SpO2 97 %. Physical Exam  Constitutional: She is oriented to person, place, and time. She appears well-developed and well-nourished.  HENT:  Head: Normocephalic and atraumatic.  Eyes: Conjunctivae and EOM are normal. Pupils  are equal, round, and reactive to light.  Neck: Normal range of motion. Neck supple.  Cardiovascular: Normal rate and regular rhythm.   Respiratory: Effort normal and breath sounds normal.  GI: Soft. Bowel sounds are normal. She exhibits no distension and no mass. There is no tenderness. There is no rebound and no guarding.  Neurological: She is alert and oriented to person, place, and time.  Skin: Skin is warm and dry.  Psychiatric: She has a normal mood and affect. Her behavior is normal. Judgment and thought content normal.   Assessment/Plan: 1) Acute abdominal pain with chest pain ?acute pancreatitis: I have advised the patient to follow a low fat diet and call my office if she has any recurrence of her symptoms. Her LFT's will need to be checked and she may need an MRCP. Continue present care for now.   2) Personal history of colonic polyps. 3) Sigmoid diverticulosis without diverticulitis.  4) HTN/Hyperlipidemia-agree with stopping statins. 5) Dementia.  Lillias Difrancesco 05/18/2015, 6:07 PM

## 2015-05-18 NOTE — ED Notes (Addendum)
Pt can go to floor at 0800. Colleen Huang

## 2015-05-18 NOTE — Evaluation (Signed)
Physical Therapy Evaluation Patient Details Name: Colleen Huang MRN: [id number] DOB: 1923/09/01 Today's Date: 05/18/2015   History of Present Illness  81 year old female with past medical history of dementia, hypothyroidism, dyslipidemia, hypertension, just recently hospitalized for acute pancreatitis and discharged 05/17/2015. [name] Patient reported coming home and continuing to experience epigastric pain and chest pain.  no abnormality in labs at admission.  Clinical Impression  Patient presents with unsteady gait without upper extremity support. Patient will benefit from PT while in acute care to address problems listed in the note below to DC to  SNF vs. Home with HHPT.   Follow Up Recommendations SNF;Supervision/Assistance - 24 hour    Equipment Recommendations  None recommended by PT    Recommendations for Other Services       Precautions / Restrictions Precautions Precautions: Fall      Mobility  Bed Mobility Overal bed mobility: Needs Assistance Bed Mobility: Supine to Sit     Supine to sit: Supervision     General bed mobility comments: extra time  Transfers Overall transfer level: Needs assistance Equipment used: 1 person hand held assist;Rolling walker (2 wheeled) Transfers: Sit to/from Stand Sit to Stand: Min assist;Min guard         General transfer comment: cues for safety, steady assist for balance upon standing.  Ambulation/Gait Ambulation/Gait assistance: Min assist Ambulation Distance (Feet): 120 Feet Assistive device: Rolling walker (2 wheeled);1 person hand held assist     Gait velocity interpretation: Below normal speed for age/gender General Gait Details: ambulated x 10 ' with HHA, unsteady, then ambulated x 1oo' with RW, then 9' with HHA, patient  very unsteady and reaching for rail. continued 68' more with RW.  Stairs            Wheelchair Mobility    Modified Rankin (Stroke Patients Only)       Balance Overall balance  assessment: Needs assistance Sitting-balance support: No upper extremity supported;Feet supported Sitting balance-Leahy Scale: Good     Standing balance support: During functional activity;No upper extremity supported Standing balance-Leahy Scale: Poor                               Pertinent Vitals/Pain Pain Assessment: Faces Faces Pain Scale: Hurts little more Pain Location: chest /abdomen Pain Descriptors / Indicators: Burning Pain Intervention(s): Limited activity within patient's tolerance;Monitored during session    Home Living Family/patient expects to be discharged to:: Private residence Living Arrangements: Alone;Other relatives;Children Available Help at Discharge: Family;Available PRN/intermittently Type of Home: House Home Access: Level entry     Home Layout: One level Home Equipment: Cane - single point      Prior Function Level of Independence: Independent         Comments: prior to last admission.     Hand Dominance   Dominant Hand: Right    Extremity/Trunk Assessment   Upper Extremity Assessment: Generalized weakness           Lower Extremity Assessment: Generalized weakness      Cervical / Trunk Assessment: Kyphotic  Communication   Communication: HOH  Cognition Arousal/Alertness: Awake/alert Behavior During Therapy: WFL for tasks assessed/performed Overall Cognitive Status: Within Functional Limits for tasks assessed                      General Comments      Exercises        Assessment/Plan    PT Assessment Patient  needs continued PT services  PT Diagnosis Difficulty walking;Generalized weakness   PT Problem List Decreased strength;Decreased activity tolerance;Decreased balance;Decreased mobility;Decreased knowledge of use of DME;Decreased safety awareness;Decreased knowledge of precautions  PT Treatment Interventions DME instruction;Gait training;Functional mobility training;Therapeutic  activities;Therapeutic exercise;Patient/family education   PT Goals (Current goals can be found in the Care Plan section) Acute Rehab PT Goals Patient Stated Goal: to go home, it's close to Christmas. PT Goal Formulation: With patient/family Time For Goal Achievement: 06/01/15 Potential to Achieve Goals: Good    Frequency Min 3X/week   Barriers to discharge Decreased caregiver support      Co-evaluation               End of Session Equipment Utilized During Treatment: Gait belt Activity Tolerance: Patient tolerated treatment well Patient left: in chair;with call bell/phone within reach;with chair alarm set Nurse Communication: Mobility status    Functional Assessment Tool Used: clinical judgement Functional Limitation: Mobility: Walking and moving around Mobility: Walking and Moving Around Current Status ([id number](G8978): At least 40 percent but less than 60 percent impaired, limited or restricted Mobility: Walking and Moving Around Goal Status [phone]([id number]): At least 1 percent but less than 20 percent impaired, limited or restricted    Time: 0935-1005 PT Time Calculation (min) (ACUTE ONLY): 30 min   Charges:   PT Evaluation $Initial PT Evaluation Tier I: 1 Procedure PT Treatments $Gait Training: 8-22 mins   PT G Codes:   PT G-Codes **NOT FOR INPATIENT CLASS** Functional Assessment Tool Used: clinical judgement Functional Limitation: Mobility: Walking and moving around Mobility: Walking and Moving Around Current Status ([id number](G8978): At least 40 percent but less than 60 percent impaired, limited or restricted Mobility: Walking and Moving Around Goal Status [phone]([id number]): At least 1 percent but less than 20 percent impaired, limited or restricted    Rada HayHill, Ursala Cressy Elizabeth 05/18/2015, 11:36 AM Blanchard KelchKaren Treylan Mcclintock PT [phone]

## 2015-05-18 NOTE — ED Notes (Signed)
Bed: [id number] Expected date:  Expected time:  Means of arrival:  Comments: EMS acid reflux

## 2015-05-18 NOTE — Progress Notes (Addendum)
Pending on admission:   81 year old lady with past medical history of hyperlipidemia, diet-controlled diabetes, GERD, hypothyroidism, obesity, dementia. She was admitted from 12/1-12/4 for acute pancreatitis. She was just discharged home yesterday. Etiology for her pancreatitis was not clear, statin was d/c'ed. CT abdomen was without signs of colitis. Per EDP, patient continued to have abdominal pain after she went home. She also has some chest pain. She has bradycardia. Blood pressure 91/48. Lipase 33, troponin negative, electrolytes okay, negative chest x-ray. Pt is accepted to tele bed.  Lorretta HarpXilin Jaelani Posa, MD  [facility] Pager [phone]  If 7PM-7AM, please contact night-coverage [email or web address] Password [email or web address] 05/18/2015, 7:03 AM

## 2015-05-18 NOTE — ED Provider Notes (Signed)
CSN: [id number]     Arrival date & time 05/18/15  0405 History   First MD Initiated Contact with Patient 05/18/15 [phone]     Chief Complaint  Patient presents with  . Gastroesophageal Reflux  . Abdominal Pain      HPI Patient presents to the emergency department after being discharged from the hospital yesterday.  She was hospitalized for acute pancreatitis of unknown etiology.  She went home at 6 PM last night shortly after returning home she became nauseated and began complaining of chest pain.  She asked her family to bring her back to the emergency department.  She had gone off the bed but then awoke feeling worse.  She reports that she feels a pressure sensation in her left chest.  She has some discomfort in her left neck.  No prior history of cardiac disease.  No complaints of shortness of breath.  She also reports ongoing epigastric and upper abdominal discomfort and pain.  No reports of fever.  No hematemesis.    Past Medical History  Diagnosis Date  . Hyperlipidemia   . Obesity   . Diabetes mellitus without complication [facility](HCC)    Past Surgical History  Procedure Laterality Date  . Appendectomy  1935  . Tonsillectomy  1949  . Parathyroidectomy / exploration of parathyroids  2003  . Gallbladder surgery  1997  . Colonoscopy  2005, 2009  . Flexible sigmoidoscopy  2005  . Rectal polypectomy  2005   Family History  Problem Relation Age of Onset  . Dementia Brother    Social History  Substance Use Topics  . Smoking status: Never Smoker   . Smokeless tobacco: None  . Alcohol Use: No   OB History    No data available     Review of Systems  All other systems reviewed and are negative.     Allergies  Codeine; Hydrocodone; Penicillins; Percocet; and Sulfa antibiotics  Home Medications   Prior to Admission medications   Medication Sig Start Date End Date Taking? Authorizing Provider  aluminum-magnesium hydroxide-simethicone (MAALOX) 200-200-20 MG/5ML SUSP Take 30  mLs by mouth 4 (four) times daily -  before meals and at bedtime. 05/17/15  Yes Nishant Dhungel, MD  amLODipine (NORVASC) 10 MG tablet Take 10 mg by mouth every evening.  10/09/14  Yes Historical Provider, MD  aspirin 81 MG tablet Take 81 mg by mouth every evening.    Yes Historical Provider, MD  Calcium Carb-Cholecalciferol (CALCIUM 600 + D PO) Take 1 tablet by mouth 2 (two) times daily.    Yes Historical Provider, MD  Cholecalciferol (VITAMIN D PO) Take 1 tablet by mouth 2 (two) times daily.   Yes Historical Provider, MD  donepezil (ARICEPT) 10 MG tablet Take 1 tablet (10 mg total) by mouth at bedtime. 02/10/15  Yes Anson FretAntonia B Ahern, MD  ibuprofen (ADVIL,MOTRIN) 600 MG tablet Take 1 tablet (600 mg total) by mouth every 8 (eight) hours as needed. 05/17/15  Yes Nishant Dhungel, MD  latanoprost (XALATAN) 0.005 % ophthalmic solution Place 1 drop into both eyes at bedtime.    Yes Historical Provider, MD  levothyroxine (SYNTHROID, LEVOTHROID) 137 MCG tablet Take 137 mcg by mouth daily. 12/19/14  Yes Historical Provider, MD  Omega-3 Fatty Acids (FISH OIL PO) Take 1 capsule by mouth 2 (two) times daily.   Yes Historical Provider, MD   BP 91/48 mmHg  Pulse 55  Temp(Src) 98.3 F (36.8 C) (Oral)  Resp 25  SpO2 100% Physical Exam  Constitutional: She  is oriented to person, place, and time. She appears well-developed and well-nourished. No distress.  HENT:  Head: Normocephalic and atraumatic.  Eyes: EOM are normal.  Neck: Normal range of motion.  Cardiovascular: Normal rate, regular rhythm and normal heart sounds.   Pulmonary/Chest: Effort normal and breath sounds normal.  Abdominal: Soft. She exhibits no distension. There is no tenderness.  Musculoskeletal: Normal range of motion.  Neurological: She is alert and oriented to person, place, and time.  Skin: Skin is warm and dry.  Psychiatric: She has a normal mood and affect. Judgment normal.  Nursing note and vitals reviewed.   ED Course  Procedures  (including critical care time) Labs Review Labs Reviewed  CBC WITH DIFFERENTIAL/PLATELET - Abnormal; Notable for the following:    Hemoglobin 11.7 (*)    HCT 35.5 (*)    All other components within normal limits  COMPREHENSIVE METABOLIC PANEL - Abnormal; Notable for the following:    Glucose, Bld 100 (*)    All other components within normal limits  LIPASE, BLOOD  TROPONIN I  TROPONIN I  TROPONIN I  TROPONIN I    Imaging Review Dg Chest Portable 1 View  05/18/2015  CLINICAL DATA:  81 year old female with mid chest pain. EXAM: PORTABLE CHEST 1 VIEW COMPARISON:  Chest radiograph dated 02/26/2004 FINDINGS: The heart size and mediastinal contours are within normal limits. Both lungs are clear. The visualized skeletal structures are unremarkable. IMPRESSION: No active disease. Electronically Signed   By: Elgie Collard M.D.   On: 05/18/2015 05:30   I have personally reviewed and evaluated these images and lab results as part of my medical decision-making.   EKG Interpretation   Date/Time:  Monday May 18 2015 05:00:17 EST Ventricular Rate:  67 PR Interval:  166 QRS Duration: 85 QT Interval:  401 QTC Calculation: 423 R Axis:   21 Text Interpretation:  Sinus rhythm No significant change was found  Confirmed by Cydne Grahn  MD, Dequita Schleicher ([id number]) on 05/18/2015 6:46:32 AM      MDM   Final diagnoses:  Chest pain, unspecified chest pain type  Nausea   Unclear etiology of patient's chest pain.  EKG 2 without ischemic changes.  Her troponin is negative.  This could be a presentation of atypical ACS given her advanced age and female gender.  Given her recent hospitalization nothing is reasonable to place her back into an observational status in the hospital for repeat evaluation.      Azalia Bilis, MD 05/18/15 [phone]

## 2015-05-18 NOTE — Clinical Social Work Placement (Signed)
   CLINICAL SOCIAL WORK PLACEMENT  NOTE  Date:  05/18/2015  Patient Details  Name: Gwyndolyn KaufmanMarion S Mateja MRN: [id number] Date of Birth: 11/11/1923  Clinical Social Work is seeking post-discharge placement for this patient at the Skilled  Nursing Facility level of care (*CSW will initial, date and re-position this form in  chart as items are completed):  Yes   Patient/family provided with [facility] Clinical Social Work Department's list of facilities offering this level of care within the geographic area requested by the patient (or if unable, by the patient's family).  Yes   Patient/family informed of their freedom to choose among providers that offer the needed level of care, that participate in Medicare, Medicaid or managed care program needed by the patient, have an available bed and are willing to accept the patient.  Yes   Patient/family informed of [facility]'s ownership interest [facility] and [facility], as well as of the fact that they are under no obligation to receive care at these facilities.  PASRR submitted to EDS on 05/18/15     PASRR number received on 05/18/15     Existing PASRR number confirmed on       FL2 transmitted to all facilities in geographic area requested by pt/family on 05/18/15     FL2 transmitted to all facilities within larger geographic area on       Patient informed that his/her managed care company has contracts with or will negotiate with certain facilities, including the following:            Patient/family informed of bed offers received.  Patient chooses bed at       Physician recommends and patient chooses bed at      Patient to be transferred to   on  .  Patient to be transferred to facility by       Patient family notified on   of transfer.  Name of family member notified:        PHYSICIAN Please sign FL2     Additional Comment:    _______________________________________________ Orson EvaKIDD, Kamarian Sahakian A, LCSW 05/18/2015,  2:47 PM

## 2015-05-18 NOTE — ED Notes (Signed)
Per EMS report: pt from home: Family reports pt was recently dx with pancreatitis.  Pt reports worsening pain in her abd that has radiated up to her chest. Pt a/o x 4 and ambulatory. EMS's 12 Lead EKG was NSR.

## 2015-05-18 NOTE — Progress Notes (Signed)
Pt is active with [facility] for [facility] [phone] Colleen Huang.

## 2015-05-18 NOTE — ED Notes (Signed)
Pt repositioned. Pt attempted to get out of bed with rails up. Pt stated she wanted to sit up. Directed pt back in bed and positioned her in up right sitting position. RN aware.

## 2015-05-18 NOTE — ED Notes (Signed)
Pt's HR dropped to 39-45 per minute.  Pt didn't exhibit any symptoms related to the decreased HR. An EKG was taken and given to Dr. Patria Maneampos.

## 2015-05-18 NOTE — ED Provider Notes (Signed)
CSN: [id number]     Arrival date & time 05/18/15  0405 History   First MD Initiated Contact with Patient 05/18/15 [phone]     Chief Complaint  Patient presents with  . Gastroesophageal Reflux  . Abdominal Pain     (Consider location/radiation/quality/duration/timing/severity/associated sxs/prior Treatment) HPI  Past Medical History  Diagnosis Date  . Hyperlipidemia   . Obesity   . Diabetes mellitus without complication [facility])    Past Surgical History  Procedure Laterality Date  . Appendectomy  1935  . Tonsillectomy  1949  . Parathyroidectomy / exploration of parathyroids  2003  . Gallbladder surgery  1997  . Colonoscopy  2005, 2009  . Flexible sigmoidoscopy  2005  . Rectal polypectomy  2005   Family History  Problem Relation Age of Onset  . Dementia Brother    Social History  Substance Use Topics  . Smoking status: Never Smoker   . Smokeless tobacco: None  . Alcohol Use: No   OB History    No data available     Review of Systems  All other systems reviewed and are negative.     Allergies  Codeine; Hydrocodone; Penicillins; Percocet; and Sulfa antibiotics  Home Medications   Prior to Admission medications   Medication Sig Start Date End Date Taking? Authorizing Provider  aluminum-magnesium hydroxide-simethicone (MAALOX) 200-200-20 MG/5ML SUSP Take 30 mLs by mouth 4 (four) times daily -  before meals and at bedtime. 05/17/15  Yes Nishant Dhungel, MD  amLODipine (NORVASC) 10 MG tablet Take 10 mg by mouth every evening.  10/09/14  Yes Historical Provider, MD  aspirin 81 MG tablet Take 81 mg by mouth every evening.    Yes Historical Provider, MD  Calcium Carb-Cholecalciferol (CALCIUM 600 + D PO) Take 1 tablet by mouth 2 (two) times daily.    Yes Historical Provider, MD  Cholecalciferol (VITAMIN D PO) Take 1 tablet by mouth 2 (two) times daily.   Yes Historical Provider, MD  donepezil (ARICEPT) 10 MG tablet Take 1 tablet (10 mg total) by mouth at bedtime. 02/10/15  Yes  Anson Fret, MD  ibuprofen (ADVIL,MOTRIN) 600 MG tablet Take 1 tablet (600 mg total) by mouth every 8 (eight) hours as needed. 05/17/15  Yes Nishant Dhungel, MD  latanoprost (XALATAN) 0.005 % ophthalmic solution Place 1 drop into both eyes at bedtime.    Yes Historical Provider, MD  levothyroxine (SYNTHROID, LEVOTHROID) 137 MCG tablet Take 137 mcg by mouth daily. 12/19/14  Yes Historical Provider, MD  Omega-3 Fatty Acids (FISH OIL PO) Take 1 capsule by mouth 2 (two) times daily.   Yes Historical Provider, MD   BP 107/51 mmHg  Pulse 55  Temp(Src) 98.3 F (36.8 C) (Oral)  Resp 17  SpO2 100% Physical Exam  Constitutional: She is oriented to person, place, and time. She appears well-developed and well-nourished. No distress.  HENT:  Head: Normocephalic and atraumatic.  Eyes: EOM are normal.  Neck: Normal range of motion.  Cardiovascular: Normal rate, regular rhythm and normal heart sounds.   Pulmonary/Chest: Effort normal and breath sounds normal.  Abdominal: Soft. She exhibits no distension.  Mild epigastric tenderness  Musculoskeletal: Normal range of motion.  Neurological: She is alert and oriented to person, place, and time.  Skin: Skin is warm and dry.  Psychiatric: She has a normal mood and affect. Judgment normal.  Nursing note and vitals reviewed.   ED Course  Procedures (including critical care time) Labs Review Labs Reviewed  CBC WITH DIFFERENTIAL/PLATELET - Abnormal; Notable  for the following:    Hemoglobin 11.7 (*)    HCT 35.5 (*)    All other components within normal limits  COMPREHENSIVE METABOLIC PANEL - Abnormal; Notable for the following:    Glucose, Bld 100 (*)    All other components within normal limits  LIPASE, BLOOD  TROPONIN I    Imaging Review Dg Chest Portable 1 View  05/18/2015  CLINICAL DATA:  81 year old female with mid chest pain. EXAM: PORTABLE CHEST 1 VIEW COMPARISON:  Chest radiograph dated 02/26/2004 FINDINGS: The heart size and mediastinal  contours are within normal limits. Both lungs are clear. The visualized skeletal structures are unremarkable. IMPRESSION: No active disease. Electronically Signed   By: Elgie CollardArash  Radparvar M.D.   On: 05/18/2015 05:30   I have personally reviewed and evaluated these images and lab results as part of my medical decision-making.   EKG Interpretation   Date/Time:  Monday May 18 2015 05:00:17 EST Ventricular Rate:  67 PR Interval:  166 QRS Duration: 85 QT Interval:  401 QTC Calculation: 423 R Axis:   21 Text Interpretation:  Sinus rhythm No significant change was found  Confirmed by Yani Lal  MD, Tarin Johndrow ([id number]) on 05/18/2015 6:46:32 AM      MDM   Final diagnoses:  None   Will readmit for ongoing symptoms. Pt will need cycled cardiac enzymes.     Azalia BilisKevin Lucretia Pendley, MD 05/18/15 [phone]

## 2015-05-18 NOTE — Clinical Social Work Note (Signed)
Clinical Social Work Assessment  Patient Details  Name: Colleen Huang MRN: [id number] Date of Birth: 1923/11/02  Date of referral:  05/18/15               Reason for consult:  Discharge Planning                Permission sought to share information with:  Family Supports Permission granted to share information::  Yes, Verbal Permission Granted  Name::     Jessicca Stitzer  Agency::     Relationship::  son  Contact Information:  [phone]  Housing/Transportation Living arrangements for the past 2 months:  [place] of Information:  Patient, Adult Children Patient Interpreter Needed:  None Criminal Activity/Legal Involvement Pertinent to Current Situation/Hospitalization:  No - Comment as needed Significant Relationships:  Adult Children Lives with:  Self Do you feel safe going back to the place where you live?  No Need for family participation in patient care:  Yes (Comment) (pt children at bedside)  Care giving concerns:  Pt admitted from home alone. Pt readmission to the hospital. PT recommending short term rehab at [facility].    Social Worker assessment / plan:  CSW received referral for New SNF.   CSW met with pt and pts adult children at bedside. CSW introduced self and explained role. Pt discussed that she was admitted from home alone and was independent prior to admission with ADL's. CSW discussed recommendation for short term rehab with pt and pt children. Pt discussed that she wants to be home for Christmas and CSW explained that pt will not need long in the SNF and if pt motivated and works hard with therapy then it is likely that she could d/c from SNF to home before Christmas. CSW discussed that if pt is still in rehab at Christmas that individuals can sign out of rehab for the day and then return. Pt expressed understanding. Pt is agreeable to SNF for short term rehab and feels that she will benefit from the stay. CSW discussed with pt and pt family that pt  insurance requires authorization for SNF and CSW will initiate authorization process.   CSW completed FL2 and initiated SNF search to [facility]. Pt and pt family preference is [facility], but agreeable to full [facility].  CSW to follow up with pt and pt family re: SNF bed offers.   CSW to submit pt clinical information to [facility].  CSW to continue to follow for pt discharge to SNF when medically ready and insurance authorization received.   Employment status:  Retired Nurse, adult PT Recommendations:  [place] / Referral to community resources:  [place]  Patient/Family's Response to care:  Pt alert and oriented x 3. Pt eager to maintain her independence and get stronger to return home where pt has been independent with her care. Pt family supportive and actively involved in pt care.   Patient/Family's Understanding of and Emotional Response to Diagnosis, Current Treatment, and Prognosis:  Pt recognizes that her current medical conditions have contributed to her weakness and feels that short term rehab at SNF will be beneficial to her recovery.   Emotional Assessment Appearance:  Appears stated age Attitude/Demeanor/Rapport:  Other (pt appropriate) Affect (typically observed):  Accepting, Adaptable, Calm Orientation:  Oriented to Self, Oriented to Place, Oriented to  Time, Oriented to Situation Alcohol / Substance use:  Not Applicable Psych involvement (Current and /or in the community):  No (Comment)  Discharge Needs  Concerns to be addressed:  Discharge Planning Concerns Readmission within the last 30 days:  Yes Current discharge risk:  Lives alone Barriers to Discharge:  Continued Medical Work up   Ladell Pier, [place] 05/18/2015, 3:12 PM  [phone]

## 2015-05-18 NOTE — NC FL2 (Signed)
[place] MEDICAID FL2 LEVEL OF CARE SCREENING TOOL     IDENTIFICATION  Patient Name: Colleen Huang Birthdate: 14-Jan-1924 Sex: female Admission Date (Current Location): 05/18/2015  [place] and [place] Number: [facility] and Address:  [facility],  [address] [place] [address]      Provider Number: [id number]  Attending Physician Name and Address:  Alison Murray, MD  Relative Name and Phone Number:       Current Level of Care: Hospital Recommended Level of Care: Skilled Nursing Facility Prior Approval Number:    Date Approved/Denied:   PASRR Number: [id number]  Discharge Plan: SNF    Current Diagnoses: Patient Active Problem List   Diagnosis Date Noted  . Abdominal pain, epigastric 05/18/2015  . Chest pain at rest 05/18/2015  . Dyslipidemia 05/18/2015  . Benign essential HTN 05/18/2015  . Dementia without behavioral disturbance 05/18/2015  . Other specified hypothyroidism 05/18/2015    Orientation ACTIVITIES/SOCIAL BLADDER RESPIRATION    Self, Situation, Place  Active Incontinent Normal  BEHAVIORAL SYMPTOMS/MOOD NEUROLOGICAL BOWEL NUTRITION STATUS     (NONE) Incontinent Diet (Diet Heart)  PHYSICIAN VISITS COMMUNICATION OF NEEDS Height & Weight Skin    Verbally  (162.6 cm) 163 lbs. Normal          AMBULATORY STATUS RESPIRATION    Supervision limited Normal      Personal Care Assistance Level of Assistance  Bathing, Feeding, Dressing Bathing Assistance: Limited assistance Feeding assistance: Independent Dressing Assistance: Limited assistance      Functional Limitations Info  Sight, Hearing, Speech Sight Info: Adequate Hearing Info: Adequate Speech Info: Adequate       SPECIAL CARE FACTORS FREQUENCY  PT (By licensed PT), OT (By licensed OT)     PT Frequency: 5 x a week OT Frequency: 5 x a week           Additional Factors Info  Code Status, Allergies Code Status Info: FULL code Allergies  Info: Codeine, Hydrocodone, Penicillins, Percocet, Sulfa Antibiotics           Current Medications (05/18/2015):  This is the current hospital active medication list Current Facility-Administered Medications  Medication Dose Route Frequency Provider Last Rate Last Dose  . 0.9 %  sodium chloride infusion   Intravenous Continuous Alison Murray, MD 75 mL/hr at 05/18/15 0920    . alum & mag hydroxide-simeth (MAALOX/MYLANTA) 200-200-20 MG/5ML suspension 30 mL  30 mL Oral TID AC & HS Alison Murray, MD   30 mL at 05/18/15 1118  . [START ON 05/19/2015] amLODipine (NORVASC) tablet 10 mg  10 mg Oral Daily Alison Murray, MD      . aspirin EC tablet 81 mg  81 mg Oral QPM Alison Murray, MD      . donepezil (ARICEPT) tablet 10 mg  10 mg Oral QHS Alison Murray, MD      . latanoprost (XALATAN) 0.005 % ophthalmic solution 1 drop  1 drop Both Eyes QHS Alison Murray, MD      . levothyroxine (SYNTHROID, LEVOTHROID) tablet 137 mcg  137 mcg Oral QAC breakfast Alison Murray, MD   137 mcg at 05/18/15 0920  . omega-3 acid ethyl esters (LOVAZA) capsule 1 g  1 g Oral Daily Alison Murray, MD   1 g at 05/18/15 0920  . sodium chloride 0.9 % injection 3 mL  3 mL Intravenous Q12H Alison Murray, MD   3 mL at 05/18/15 1000  Discharge Medications: Please see discharge summary for a list of discharge medications.  Relevant Imaging Results:  Relevant Lab Results:  Recent Labs    Additional Information SSN: [id number]  KIDD, SUZANNA A, LCSW

## 2015-05-19 DIAGNOSIS — R079 Chest pain, unspecified: Secondary | ICD-10-CM | POA: Insufficient documentation

## 2015-05-19 DIAGNOSIS — R1013 Epigastric pain: Secondary | ICD-10-CM | POA: Diagnosis not present

## 2015-05-19 DIAGNOSIS — F0390 Unspecified dementia without behavioral disturbance: Secondary | ICD-10-CM | POA: Diagnosis not present

## 2015-05-19 DIAGNOSIS — F411 Generalized anxiety disorder: Secondary | ICD-10-CM | POA: Diagnosis not present

## 2015-05-19 LAB — CBC
HEMATOCRIT: 30.6 % — AB (ref 36.0–46.0)
HEMOGLOBIN: 10.4 g/dL — AB (ref 12.0–15.0)
MCH: 30.2 pg (ref 26.0–34.0)
MCHC: 34 g/dL (ref 30.0–36.0)
MCV: 89 fL (ref 78.0–100.0)
Platelets: 290 10*3/uL (ref 150–400)
RBC: 3.44 MIL/uL — ABNORMAL LOW (ref 3.87–5.11)
RDW: 13.4 % (ref 11.5–15.5)
WBC: 6.3 10*3/uL (ref 4.0–10.5)

## 2015-05-19 LAB — BASIC METABOLIC PANEL
ANION GAP: 7 (ref 5–15)
BUN: 16 mg/dL (ref 6–20)
CHLORIDE: 107 mmol/L (ref 101–111)
CO2: 23 mmol/L (ref 22–32)
Calcium: 9.1 mg/dL (ref 8.9–10.3)
Creatinine, Ser: 0.82 mg/dL (ref 0.44–1.00)
GFR calc Af Amer: >60 mL/min (ref >60)
GFR calc non Af Amer: >60 mL/min (ref >60)
GLUCOSE: 108 mg/dL — AB (ref 65–99)
POTASSIUM: 4 mmol/L (ref 3.5–5.1)
Sodium: 137 mmol/L (ref 135–145)

## 2015-05-19 LAB — GLUCOSE, CAPILLARY: GLUCOSE-CAPILLARY: 96 mg/dL (ref 65–99)

## 2015-05-19 MED ORDER — ALPRAZOLAM 0.5 MG PO TABS: 0.5000 mg | ORAL_TABLET | Freq: Two times a day (BID) | ORAL | Status: DC | PRN

## 2015-05-19 NOTE — Progress Notes (Signed)
Gave report to Amrah, RN at [facility]. Left number if she had additional questions.

## 2015-05-19 NOTE — Discharge Summary (Signed)
Physician Discharge Summary  Colleen Huang FAO:[id number] DOB: [birth year] DOA: 05/18/2015  PCP: [name], MD  Admit date: 05/18/2015 Discharge date: 05/19/2015  Time spent: 25 minutes  Recommendations for Outpatient Follow-up:  1. Discharged to skilled nursing facility ([facility]) 2. Follow-up with Dr. [name] in 4 weeks as needed.   Discharge Diagnoses:  Principal Problem:   Abdominal pain, epigastric   Active Problems:   Chest pain at rest   Dyslipidemia   Benign essential HTN   Dementia without behavioral disturbance   Other specified hypothyroidism   Anxiety state   Discharge Condition: Fair  Diet recommendation: Strict Low-fat diet  Filed Weights   05/18/15 0826 05/19/15 0544  Weight: 74 kg (163 lb 2.3 oz) 75.932 kg (167 lb 6.4 oz)    History of present illness:  81 year old female with past medical history of dementia (mild), hypothyroidism, dyslipidemia, hypertension, discharged home after being hospitalized for acute pancreatitis on 05/17/2015. She was discharged with instruction to continue full liquid diet for 2 days and advance to low-fat diet after that. Patient reported coming home and continuing to experience epigastric pain and chest pain. Patient was unable to differentiate whether the pain starts in the chest or epigastric area but was sharp, intermittent, 10 out of 10 in intensity and at rest. She was not able to advance the diet more than full liquid because of the pain. Denied any nausea, vomiting, diarrhea, blood in stool, fevers or chills.  In ED, patient was hemodynamically stable. Blood work was unremarkable including normal EKG and troponin. Patient admitted further for evaluation of her epigastric pain/chest pain.  Hospital Course:  Acute epigastric pain/chest pain Unclear if this was recurrence of her pancreatitis symptoms (unlikely) versus atypical chest pain. Lipase was normal and symptoms improved with supportive care alone. LFTs were  normal as well. GI was consulted who recommended conservative management for now and follow-up with her as outpatient if symptoms recur. I think patient might have acute gastritis symptoms with a complement of significant anxiety admit her to return to the hospital. -Patient has been stable on the monitor. Serial troponins have been negative. Continued on aspirin. -She was seen by physical therapy and recommended skilled nursing facility. Patient lives alone with her son close by and provides good care however she does not have a good insight and a source about calling out for help (family member or 911) when she is symptomatic or needs help. -Patient agrees to go to skilled nursing facility for a short duration. -I will add on low-dose Xanax as needed for anxiety symptoms. Continue Maalox. -Patient should be strictly on low-fat diet.  Dementia Mild. Has short-term memory loss. Continue Aricept.  Hypothyroidism Continue Synthroid  Dyslipidemia Continue Omega-3 supplement. Fibrate and  statin were discontinued during recent hospitalization as lipid panel was normal and with the suspicion that they could be continued routine to her pancreatitis.  Essential hypertension.  Stable. Continue Norvasc.  Patient was treated for UTI recently with ciprofloxacin. Completed on 05/17/2015.  Patient is stable to be discharged to skilled nursing facility.  Procedures:  None  Consultations:  GI-Dr. [name]  Discharge Exam: Filed Vitals:   05/18/15 2129 05/19/15 0544  BP: 122/44 140/62  Pulse: 60 61  Temp: 98 F (36.7 C) 97.9 F (36.6 C)  Resp: 18 18     General: Elderly pleasant female not in distress  HEENT: No pallor, dry oral mucosa  Chest: Clear to auscultation bilaterally  CVS: Normal S1 and S2, no murmurs rub or gallop  GI: Soft, nondistended, nontender, bowel sounds present  Musculoskeletal: Warm, no edema  CNS: Alert and oriented  Discharge Instructions    Current  Discharge Medication List    START taking these medications   Details  ALPRAZolam (XANAX) 0.5 MG tablet Take 1 tablet (0.5 mg total) by mouth 2 (two) times daily as needed for anxiety. Qty: 10 tablet, Refills: 0      CONTINUE these medications which have NOT CHANGED   Details  aluminum-magnesium hydroxide-simethicone (MAALOX) 200-200-20 MG/5ML SUSP Take 30 mLs by mouth 4 (four) times daily -  before meals and at bedtime. Qty: 355 mL, Refills: 0    amLODipine (NORVASC) 10 MG tablet Take 10 mg by mouth every evening.  Refills: 1    aspirin 81 MG tablet Take 81 mg by mouth every evening.     Calcium Carb-Cholecalciferol (CALCIUM 600 + D PO) Take 1 tablet by mouth 2 (two) times daily.     Cholecalciferol (VITAMIN D PO) Take 1 tablet by mouth 2 (two) times daily.    donepezil (ARICEPT) 10 MG tablet Take 1 tablet (10 mg total) by mouth at bedtime. Qty: 30 tablet, Refills: 11    latanoprost (XALATAN) 0.005 % ophthalmic solution Place 1 drop into both eyes at bedtime.     levothyroxine (SYNTHROID, LEVOTHROID) 137 MCG tablet Take 137 mcg by mouth daily. Refills: 11    Omega-3 Fatty Acids (FISH OIL PO) Take 1 capsule by mouth 2 (two) times daily.      STOP taking these medications     ibuprofen (ADVIL,MOTRIN) 600 MG tablet        Allergies  Allergen Reactions  . Codeine     Really bad reaction that's all she can remember   . Hydrocodone     Really bad reaction that's all she can remember   . Penicillins Itching, Swelling and Rash    Has patient had a PCN reaction causing immediate rash, facial/tongue/throat swelling, SOB or lightheadedness with hypotension: Yes  Has patient had a PCN reaction causing severe rash involving mucus membranes or skin necrosis: Yes  Has patient had a PCN reaction that required hospitalization: No  Has patient had a PCN reaction occurring within the last 10 years: No  If all of the above answers are "NO", then may proceed with Cephalosporin use.    [name] Percocet [Oxycodone-Acetaminophen]     unknown  . Sulfa Antibiotics     Unknown reaction    Follow-up Information    Please follow up.   Why:  MD at SNF      Follow up with [facility], MD. Call in 4 weeks.   Specialty:  Gastroenterology   Contact information:   [address] [place] [address] [phone]        The results of significant diagnostics from this hospitalization (including imaging, microbiology, ancillary and laboratory) are listed below for reference.    Significant Diagnostic Studies: Ct Abdomen Pelvis W Contrast  05/14/2015  CLINICAL DATA:  Three-day history of left upper quadrant pain EXAM: CT ABDOMEN AND PELVIS WITH CONTRAST TECHNIQUE: Multidetector CT imaging of the abdomen and pelvis was performed using the standard protocol following bolus administration of intravenous contrast. Oral contrast was also administered. CONTRAST:  50mL OMNIPAQUE IOHEXOL 300 MG/ML SOLN, OMNIPAQUE IOHEXOL 300 MG/ML SOLN COMPARISON:  None. FINDINGS: Lower chest:  Lung bases are clear. Hepatobiliary: No focal liver lesions are appreciable. Gallbladder is absent. Common bile duct measures 10 mm which is extreme upper  normal for post cholecystectomy state. No biliary duct mass or calculus is appreciable. Pancreas: No pancreatic mass is seen. There is equivocal mesenteric thickening immediately adjacent to the pancreas. A small amount of fluid is seen slightly posteriorly and inferior to the pancreas extending to the left lateral conal fascia, likely secondary to a degree of acute pancreatitis. There is no pancreatic pseudocyst or duct dilatation. Spleen: No splenic lesions are identified. There are 3 small calcified splenic artery aneurysms, largest measuring 1.3 x 1.3 cm. Adrenals/Urinary Tract: Adrenals bilaterally appear normal. Kidneys bilaterally show no demonstrable mass or hydronephrosis. No renal or ureteral calculus is appreciable. Urinary bladder is midline  with normal wall thickness. Stomach/Bowel: There are occasional sigmoid diverticula without diverticulitis. There is no bowel obstruction. No free air or portal venous air. Vascular/Lymphatic: There is atherosclerotic calcification throughout the aorta and proximal common iliac arteries. There is no abdominal aortic aneurysm. Major mesenteric arteries appear patent. There is no demonstrable adenopathy in the abdomen or pelvis. Reproductive: Small calcifications in the uterus probably represent mild leiomyomatous change. There is no pelvic mass or pelvic fluid collection. Other: Appendix absent. No periappendiceal region inflammation. No abscess or ascites in the abdomen or pelvis. Musculoskeletal: There is degenerative change throughout the lower thoracic and lumbar spine regions. There is anterior wedging of the L5 vertebral body, age uncertain. There is slight anterolisthesis of L3 on L4, felt to be due to underlying spondylosis. There are no blastic or lytic bone lesions. There is no intramuscular or abdominal wall lesion. IMPRESSION: There is a small amount of fluid tracking posteriorly and inferiorly to the pancreas to the level of the left lateral conal fascia. There is subtle mesenteric thickening in the region of the body of the pancreas. These findings are felt to represent a degree of acute pancreatitis. There is no pancreatic mass or pseudocyst. The pancreatic duct is not dilated. The overall contour of the pancreas is not altered. Small calcified splenic artery aneurysms, not felt to have clinical significance in this age group. Gallbladder absent. Common bile duct is extreme upper normal in size for postcholecystectomy state. No mass or calculus is seen in the biliary ductal system. Appendix absent. Small calcifications in the uterus are likely due to leiomyomatous change. No dominant uterine mass appreciable. Extensive atherosclerotic calcification throughout the aorta and common iliac arteries.  Occasional sigmoid diverticula without diverticulitis. Age uncertain anterior wedging of the L5 vertebral body. Mild spondylolisthesis at L3-4, likely due to underlying spondylosis. Electronically Signed   By: Bretta Bang III M.D.   On: 05/14/2015 10:17   Dg Chest Portable 1 View  05/18/2015  CLINICAL DATA:  81 year old female with mid chest pain. EXAM: PORTABLE CHEST 1 VIEW COMPARISON:  Chest radiograph dated 02/26/2004 FINDINGS: The heart size and mediastinal contours are within normal limits. Both lungs are clear. The visualized skeletal structures are unremarkable. IMPRESSION: No active disease. Electronically Signed   By: Elgie Collard M.D.   On: 05/18/2015 05:30    Microbiology: Recent Results (from the past 240 hour(s))  Urine culture     Status: None   Collection Time: 05/14/15  8:30 AM  Result Value Ref Range Status   Specimen Description URINE, CLEAN CATCH  Final   Special Requests NONE  Final   Culture   Final    MULTIPLE SPECIES PRESENT, SUGGEST RECOLLECTION Performed at [facility]    Report Status 05/15/2015 FINAL  Final     Labs: Basic Metabolic Panel:  Recent Labs Lab 05/14/15 [id number]  05/15/15 0540 05/16/15 0924 05/18/15 0516 05/19/15 0452  NA 141 138 137 140 137  K 3.4* 4.0 4.4 4.3 4.0  CL 106 108 107 108 107  CO2 26 22 24 23 23   GLUCOSE 138* 114* 100* 100* 108*  BUN 25* 15 14 14 16   CREATININE 0.82 0.78 0.77 0.74 0.82  CALCIUM 9.7 8.8* 8.5* 9.5 9.1   Liver Function Tests:  Recent Labs Lab 05/14/15 0748 05/18/15 0516  AST 20 21  ALT 17 17  ALKPHOS 53 66  BILITOT 0.8 0.9  PROT 7.1 7.0  ALBUMIN 3.9 3.6    Recent Labs Lab 05/14/15 0748 05/15/15 0540 05/16/15 0924 05/18/15 0516  LIPASE 331* 100* 25 33   No results for input(s): AMMONIA in the last 168 hours. CBC:  Recent Labs Lab 05/14/15 0748 05/18/15 0516 05/19/15 0452  WBC 7.8 6.9 6.3  NEUTROABS 6.3 4.8  --   HGB 12.2 11.7* 10.4*  HCT 35.7* 35.5* 30.6*  MCV 87.5  88.5 89.0  PLT 326 311 290   Cardiac Enzymes:  Recent Labs Lab 05/15/15 1320 05/18/15 0516 05/18/15 0725  TROPONINI <0.03 <0.03 <0.03   BNP: BNP (last 3 results) No results for input(s): BNP in the last 8760 hours.  ProBNP (last 3 results) No results for input(s): PROBNP in the last 8760 hours.  CBG:  Recent Labs Lab 05/17/15 0407 05/17/15 0825 05/17/15 1104 05/17/15 1702 05/19/15 0751  GLUCAP 82 68 105* 97 96       Signed:  Mazal Ebey  [facility] 05/19/2015, 11:36 AM

## 2015-05-19 NOTE — Progress Notes (Signed)
Pt for discharge to [facility].   CSW facilitated pt discharge needs including contacting facility, faxing pt discharge information via [employer], [facility], discussing with pt and pt son and daughter-in-law at bedside, providing RN phone number to call report, and providing discharge packet to pt son to provide to [facility] upon arrival. Pt family plans to transport via private vehicle.  Pt family relieved that [facility] had a bed available today as the facility is close to pt family.  No further social work needs identified at this time.  CSW signing off.   Loletta SpecterSuzanna Kidd, MSW, LCSW Clinical Social Work [phone]

## 2015-05-19 NOTE — Progress Notes (Signed)
CSW continuing to follow.   CSW followed up with pt and pt daughter-in-law at bedside.   CSW reviewed SNF bed offers and [facility] is able to offer pt a bed.  CS[facility] and received [facility] for pt transfer to [facility](auth #: [id number])  CSW spoke with MD who confirmed pt medically ready for discharge today.  CSW to assist with pt discharge to [facility] today.  CSW to continue to follow.  Loletta SpecterSuzanna Kidd, MSW, LCSW Clinical Social Work [phone]

## 2015-05-19 NOTE — Discharge Instructions (Signed)
Fat and Cholesterol Restricted Diet  Getting too much fat and cholesterol in your diet may cause health problems. Following this diet helps keep your fat and cholesterol at normal levels. This can keep you from getting sick.  WHAT TYPES OF FAT SHOULD I CHOOSE?  · Choose monosaturated and polyunsaturated fats. These are found in foods such as olive oil, canola oil, flaxseeds, walnuts, almonds, and seeds.  · Eat more omega-3 fats. Good choices include salmon, mackerel, sardines, tuna, flaxseed oil, and ground flaxseeds.  · Limit saturated fats. These are in animal products such as meats, butter, and cream. They can also be in plant products such as palm oil, palm kernel oil, and coconut oil.  ·  Avoid foods with partially hydrogenated oils in them. These contain trans fats. Examples of foods that have trans fats are stick margarine, some tub margarines, cookies, crackers, and other baked goods.  WHAT GENERAL GUIDELINES DO I NEED TO FOLLOW?   · Check food labels. Look for the words "trans fat" and "saturated fat."  · When preparing a meal:    Fill half of your plate with vegetables and green salads.    Fill one fourth of your plate with whole grains. Look for the word "whole" as the first word in the ingredient list.    Fill one fourth of your plate with lean protein foods.  · Limit fruit to two servings a day. Choose fruit instead of juice.  · Eat more foods with soluble fiber. Examples of foods with this type of fiber are apples, broccoli, carrots, beans, peas, and barley. Try to get 20-30 g (grams) of fiber per day.  · Eat more home-cooked foods. Eat less at restaurants and buffets.  · Limit or avoid alcohol.  · Limit foods high in starch and sugar.  · Limit fried foods.  · Cook foods without frying them. Baking, boiling, grilling, and broiling are all great options.  · Lose weight if you are overweight. Losing even a small amount of weight can help your overall health. It can also help prevent diseases such as  diabetes and heart disease.  WHAT FOODS CAN I EAT?  Grains  Whole grains, such as whole wheat or whole grain breads, crackers, cereals, and pasta. Unsweetened oatmeal, bulgur, barley, quinoa, or brown rice. Corn or whole wheat flour tortillas.  Vegetables  Fresh or frozen vegetables (raw, steamed, roasted, or grilled). Green salads.  Fruits  All fresh, canned (in natural juice), or frozen fruits.  Meat and Other Protein Products  Ground beef (85% or leaner), grass-fed beef, or beef trimmed of fat. Skinless chicken or turkey. Ground chicken or turkey. Pork trimmed of fat. All fish and seafood. Eggs. Dried beans, peas, or lentils. Unsalted nuts or seeds. Unsalted canned or dry beans.  Dairy  Low-fat dairy products, such as skim or 1% milk, 2% or reduced-fat cheeses, low-fat ricotta or cottage cheese, or plain low-fat yogurt.  Fats and Oils  Tub margarines without trans fats. Light or reduced-fat mayonnaise and salad dressings. Avocado. Olive, canola, sesame, or safflower oils. Natural peanut or almond butter (choose ones without added sugar and oil).  The items listed above may not be a complete list of recommended foods or beverages. Contact your dietitian for more options.  WHAT FOODS ARE NOT RECOMMENDED?  Grains  White bread. White pasta. White rice. Cornbread. Bagels, pastries, and croissants. Crackers that contain trans fat.  Vegetables  White potatoes. Corn. Creamed or fried vegetables. Vegetables in a cheese sauce.    Fruits  Dried fruits. Canned fruit in light or heavy syrup. Fruit juice.  Meat and Other Protein Products  Fatty cuts of meat. Ribs, chicken wings, bacon, sausage, bologna, salami, chitterlings, fatback, hot dogs, bratwurst, and packaged luncheon meats. Liver and organ meats.  Dairy  Whole or 2% milk, cream, half-and-half, and cream cheese. Whole milk cheeses. Whole-fat or sweetened yogurt. Full-fat cheeses. Nondairy creamers and whipped toppings. Processed cheese, cheese spreads, or cheese  curds.  Sweets and Desserts  Corn syrup, sugars, honey, and molasses. Candy. Jam and jelly. Syrup. Sweetened cereals. Cookies, pies, cakes, donuts, muffins, and ice cream.  Fats and Oils  Butter, stick margarine, lard, shortening, ghee, or bacon fat. Coconut, palm kernel, or palm oils.  Beverages  Alcohol. Sweetened drinks (such as sodas, lemonade, and fruit drinks or punches).  The items listed above may not be a complete list of foods and beverages to avoid. Contact your dietitian for more information.     This information is not intended to replace advice given to you by your health care provider. Make sure you discuss any questions you have with your health care provider.     Document Released: 11/29/2011 Document Revised: 06/20/2014 Document Reviewed: 08/29/2013  Elsevier Interactive Patient Education ©2016 Elsevier Inc.

## 2015-05-19 NOTE — Clinical Social Work Placement (Signed)
   CLINICAL SOCIAL WORK PLACEMENT  NOTE  Date:  05/19/2015  Patient Details  Name: Colleen Huang MRN: [id number] Date of Birth: 10/15/1923  Clinical Social Work is seeking post-discharge placement for this patient at the Skilled  Nursing Facility level of care (*CSW will initial, date and re-position this form in  chart as items are completed):  Yes   Patient/family provided with [facility] Clinical Social Work Department's list of facilities offering this level of care within the geographic area requested by the patient (or if unable, by the patient's family).  Yes   Patient/family informed of their freedom to choose among providers that offer the needed level of care, that participate in Medicare, Medicaid or managed care program needed by the patient, have an available bed and are willing to accept the patient.  Yes   Patient/family informed of [facility]'s ownership interest in [facility] and [facility], as well as of the fact that they are under no obligation to receive care at these facilities.  PASRR submitted to EDS on 05/18/15     PASRR number received on 05/18/15     Existing PASRR number confirmed on       FL2 transmitted to all facilities in geographic area requested by pt/family on 05/18/15     FL2 transmitted to all facilities within larger geographic area on       Patient informed that his/her managed care company has contracts with or will negotiate with certain facilities, including the following:        Yes   Patient/family informed of bed offers received.  Patient chooses bed at [facility] [place]     Physician recommends and patient chooses bed at      Patient to be transferred to [facility] [place] on 05/19/15.  Patient to be transferred to facility by pt family via private vehicle     Patient family notified on 05/19/15 of transfer.  Name of family member notified:  pt and pt son, Rosanne AshingJim notified at bedside      PHYSICIAN Please sign FL2     Additional Comment:    _______________________________________________ Orson EvaKIDD, SUZANNA A, LCSW 05/19/2015, 12:01 PM

## 2015-06-16 ENCOUNTER — Telehealth: Payer: Self-pay | Admitting: Neurology

## 2015-06-16 NOTE — Telephone Encounter (Signed)
Son Colleen Huang called to advise Mom's dementia seems to have progressed rapidly over the past few days also notes she had UTI that didn't seem to clear up completely, PCP said on 12/23 at follow up visit that there was still a trace of UTI and to finish up the antibiotic, also states she has been having severe headaches, patient was discharged from [facility] 05/29/15.

## 2015-06-16 NOTE — Telephone Encounter (Signed)
Called son back. Ok per [place]. She has finished antibiotic. Son states pt has no c/o of UTI. She never complained before of UTI. They found this. Took xanax yesterday. Had a lot of confusion afterwords. They did not know she found medication and she took it. They hid the bottle so she could not take anymore without their knowledge. Advised they should contact PCP in meantime to f/u to see if pt still has UTI. He will let us know about results. Made appt with Darrol Angelarolyn Martin for f/u on increased confusion/headaches.on 06/18/15 at 4pm, check in 345pm. He verbalized understanding.

## 2015-06-18 ENCOUNTER — Encounter: Payer: Self-pay | Admitting: Nurse Practitioner

## 2015-06-18 ENCOUNTER — Ambulatory Visit (INDEPENDENT_AMBULATORY_CARE_PROVIDER_SITE_OTHER): Payer: Medicare Other | Admitting: Nurse Practitioner

## 2015-06-18 VITALS — BP 133/58 | HR 57 | Ht 64.0 in | Wt 157.2 lb

## 2015-06-18 DIAGNOSIS — F0390 Unspecified dementia without behavioral disturbance: Secondary | ICD-10-CM

## 2015-06-18 DIAGNOSIS — F411 Generalized anxiety disorder: Secondary | ICD-10-CM | POA: Diagnosis not present

## 2015-06-18 DIAGNOSIS — G4452 New daily persistent headache (NDPH): Secondary | ICD-10-CM | POA: Diagnosis not present

## 2015-06-18 DIAGNOSIS — R51 Headache: Secondary | ICD-10-CM

## 2015-06-18 DIAGNOSIS — R519 Headache, unspecified: Secondary | ICD-10-CM | POA: Insufficient documentation

## 2015-06-18 NOTE — Patient Instructions (Addendum)
Will check labs  Will get CT of the head Follow up as planned with dr. Lucia GaskinsAhern

## 2015-06-18 NOTE — Progress Notes (Addendum)
[facility]  PATIENT: Colleen Huang DOB: 1923-06-25   REASON FOR VISIT: follow up for memory loss B12 deficiency, acute headache HISTORY FROM:son and daughter-in-law, patient    HISTORY OF PRESENT ILLNESS:Colleen Huang 82 year old female returns for follow-up. She was initially evaluated for memory loss by Dr. Jaynee Eagles 01/06/2015. She returns today for follow-up but also for acute headache for several days. Her family states she has never had headaches before. The headache is located over the ears she feels a fullness in her ears,she denies any visual disturbance although does have blurred vision, no difficulty chewing. Some dizziness is noted however she has not had any falls or difficulty walking.No jaw claudication. She was given some Xanax by her primary care previously which has been given to the patient without much benefit although it has decreased her anxiety. She has also taken some ibuprofen but this has not been beneficial. She returns in regards to this headache.   HISTORY: Colleen Huang is a 82 y.o. female here as a referral from Dr. Vista Lawman for memory problems. PMHx HTN, DM, HLD.   Here with daughter-in-law. Memory issues for many years, worsening in the last 5 years. Patient keeps a calendar and writes things down. She checks her calendar every day. She cant remember what happened yesterday, gets confused. More short-term memory loss. She can remember things that happened a long time ago. She is forgetting her grandchildren's birthday more recently. She lives at home alone. She left the oven on one time and burnt brownies. She pays her own bills, she is up to date on her bills. She knows the bills she needs to pay. She is fine day to day, cooks her own meals, takes care of herself. She drives short distances to familiar places like church and the grocery store. She doesn't like to drive at night. She is stubborn and independent. Likes to be  independent. She has lost a lot of friends and that is upsetting but no mood problems or depression. No hallucinations or delusions. Not missing any medications, manages herself. She keeps her own house. No confusional episodes. She repeats things a lot. No alterations of consciousness. Brother with Alzheimer's. Patient says every once in a while she is confused. Patient says she didn't want to come here today.    REVIEW OF SYSTEMS: Full 14 system review of systems performed and notable only for those listed, all others are neg:  Constitutional: neg  Cardiovascular: neg Ear/Nose/Throat: ears field full  Skin: neg Eyes: blurred vision Respiratory: neg Gastroitestinal: neg  Hematology/Lymphatic: neg  Endocrine: neg Musculoskeletal:neg Allergy/Immunology: neg Neurological: dizziness, headache Psychiatric: irritability Sleep : neg   ALLERGIES: Allergies  Allergen Reactions  . Codeine     Really bad reaction that's all she can remember   . Hydrocodone     Really bad reaction that's all she can remember   . Penicillins Itching, Swelling and Rash    Has patient had a PCN reaction causing immediate rash, facial/tongue/throat swelling, SOB or lightheadedness with hypotension: Yes  Has patient had a PCN reaction causing severe rash involving mucus membranes or skin necrosis: Yes  Has patient had a PCN reaction that required hospitalization: No  Has patient had a PCN reaction occurring within the last 10 years: No  If all of the above answers are "NO", then may proceed with Cephalosporin use.   [name] Percocet [Oxycodone-Acetaminophen]     unknown  . Sulfa Antibiotics     Unknown reaction  HOME MEDICATIONS: Outpatient Prescriptions Prior to Visit  Medication Sig Dispense Refill  . ALPRAZolam (XANAX) 0.5 MG tablet Take 1 tablet (0.5 mg total) by mouth 2 (two) times daily as needed for anxiety. 10 tablet 0  . amLODipine (NORVASC) 10 MG tablet Take 10 mg by mouth every evening.   1  .  Calcium Carb-Cholecalciferol (CALCIUM 600 + D PO) Take 1 tablet by mouth 2 (two) times daily.     [name] donepezil (ARICEPT) 10 MG tablet Take 1 tablet (10 mg total) by mouth at bedtime. 30 tablet 11  . latanoprost (XALATAN) 0.005 % ophthalmic solution Place 1 drop into both eyes at bedtime.     [name] levothyroxine (SYNTHROID, LEVOTHROID) 137 MCG tablet Take 137 mcg by mouth daily.  11  . Omega-3 Fatty Acids (FISH OIL PO) Take 1 capsule by mouth 2 (two) times daily.    [name] aluminum-magnesium hydroxide-simethicone (MAALOX) 876-811-57 MG/5ML SUSP Take 30 mLs by mouth 4 (four) times daily -  before meals and at bedtime. (Patient not taking: Reported on 06/18/2015) 355 mL 0  . aspirin 81 MG tablet Take 81 mg by mouth every evening. Reported on 06/18/2015    . Cholecalciferol (VITAMIN D PO) Take 1 tablet by mouth 2 (two) times daily. Reported on 06/18/2015     No facility-administered medications prior to visit.    PAST MEDICAL HISTORY: Past Medical History  Diagnosis Date  . Pancreatitis 04/2015    PAST SURGICAL HISTORY: Past Surgical History  Procedure Laterality Date  . Appendectomy  1935  . Tonsillectomy  1949  . Parathyroidectomy / exploration of parathyroids  2003  . Gallbladder surgery  1997  . Colonoscopy  2005, 2009  . Flexible sigmoidoscopy  2005  . Rectal polypectomy  2005    FAMILY HISTORY: Family History  Problem Relation Age of Onset  . Dementia Brother     SOCIAL HISTORY: Social History   Social History  . Marital Status: Widowed    Spouse Name: N/A  . Number of Children: 4  . Years of Education: 15   Occupational History  . Not on file.   Social History Main Topics  . Smoking status: Never Smoker   . Smokeless tobacco: Not on file  . Alcohol Use: No  . Drug Use: No  . Sexual Activity: Not on file   Other Topics Concern  . Not on file   Social History Narrative   Lives at home by herself.   Caffeine use: 1-2 cups hot tea/day.      PHYSICAL EXAM  Filed  Vitals:   06/18/15 1608  BP: 133/58  Pulse: 57  Height: 5' 4"  (1.626 m)  Weight: 157 lb 3.2 oz (71.305 kg)   Body mass index is 26.97 kg/(m^2).  Generalized: Well developed, in no acute distress ,well-groomed Head: normocephalic and atraumatic,. Oropharynx benign no tenderness to temporal palpation Neck: Supple, no carotid bruits  Cardiac: Regular rate rhythm, no murmur  Musculoskeletal: No deformity   Neurological examination   Mentation: Alert , MOCA 23/30. Previous 19/30.AFT 13.    Follows all commands speech and language fluent.  Cranial nerve II-XII: Fundoscopic exam deferred. Pupils were equal round reactive to light extraocular movements were full, visual field were full on confrontational test. Facial sensation and strength were normal. hearing was intact to finger rubbing bilaterally. Uvula tongue midline. head turning and shoulder shrug were normal and symmetric.Tongue protrusion into cheek strength was normal. Motor: normal bulk and tone, full strength in the BUE, BLE,  fine finger movements normal, no pronator drift. No focal weakness Sensory: normal and symmetric to light touch, Coordination: finger-nose-finger, heel-to-shin bilaterally, no dysmetria Reflexes: symmetric upper and lower, plantar responses were flexor bilaterally. Gait and Station: Rising up from seated position without assistance, wide based,not ataxic.   DIAGNOSTIC DATA (LABS, IMAGING, TESTING) - I reviewed patient records, labs, notes, testing and imaging myself where available.  Lab Results  Component Value Date   WBC 6.3 05/19/2015   HGB 10.4* 05/19/2015   HCT 30.6* 05/19/2015   MCV 89.0 05/19/2015   PLT 290 05/19/2015      Component Value Date/Time   NA 137 05/19/2015 0452   NA 140 01/06/2015 1025   K 4.0 05/19/2015 0452   CL 107 05/19/2015 0452   CO2 23 05/19/2015 0452   GLUCOSE 108* 05/19/2015 0452   GLUCOSE 99 01/06/2015 1025   BUN 16 05/19/2015 0452   BUN 26 01/06/2015 1025    CREATININE 0.82 05/19/2015 0452   CALCIUM 9.1 05/19/2015 0452   PROT 7.0 05/18/2015 0516   PROT 6.6 01/06/2015 1025   ALBUMIN 3.6 05/18/2015 0516   ALBUMIN 4.2 01/06/2015 1025   AST 21 05/18/2015 0516   ALT 17 05/18/2015 0516   ALKPHOS 66 05/18/2015 0516   BILITOT 0.9 05/18/2015 0516   BILITOT 0.4 01/06/2015 1025   GFRNONAA >60 05/19/2015 0452   GFRAA >60 05/19/2015 0452   Lab Results  Component Value Date   CHOL 123 05/15/2015   HDL 38* 05/15/2015   LDLCALC 65 05/15/2015   TRIG 100 05/15/2015   CHOLHDL 3.2 05/15/2015    Lab Results  Component Value Date   VITAMINB12 170* 01/06/2015   Lab Results  Component Value Date   TSH 0.451 01/06/2015      ASSESSMENT AND PLAN  82 y.o. year old female  has a past medical history of memory problems with prior medical history of hypertension diabetes. Acute onset of headache that has been persistent Discussed with Dr. Jaynee Eagles Will check labs ESR and CRP Will get CT of the head without contrast Follow up as planned with Dr. Jaynee Eagles in 3 months Dennie Bible, [facility] [place] time 35 min  [facility] [address], [place], [place] [address] [phone]  Personally examined images, and have participated in and made any corrections needed to history, physical, neuro exam,assessment and plan as stated above.  I have personally reviewed the history, evaluated lab date, reviewed imaging studies and agree with radiology interpretations.    Sarina Ill, MD [facility]

## 2015-06-19 ENCOUNTER — Telehealth: Payer: Self-pay | Admitting: *Deleted

## 2015-06-19 LAB — SEDIMENTATION RATE: SED RATE: 6 mm/h (ref 0–40)

## 2015-06-19 LAB — C-REACTIVE PROTEIN: CRP: 3.4 mg/L (ref 0.0–4.9)

## 2015-06-19 NOTE — Telephone Encounter (Signed)
-----   Message from Nilda RiggsNancy Carolyn Martin, NP sent at 06/19/2015  9:25 AM EST ----- Please let family know labs are normal. Will await results of CT

## 2015-06-19 NOTE — Telephone Encounter (Signed)
I called and LMVM (home) ok per DPR that labs are normal.  Awaiting results of CT.  To call back if questions.

## 2015-06-25 ENCOUNTER — Ambulatory Visit
Admission: RE | Admit: 2015-06-25 | Discharge: 2015-06-25 | Disposition: A | Discharge status: Home / Self Care | Payer: Medicare Other | Source: Ambulatory Visit | Attending: Nurse Practitioner | Admitting: Nurse Practitioner

## 2015-06-25 DIAGNOSIS — G4452 New daily persistent headache (NDPH): Secondary | ICD-10-CM

## 2015-06-26 ENCOUNTER — Telehealth: Payer: Self-pay | Admitting: *Deleted

## 2015-06-26 NOTE — Telephone Encounter (Signed)
I spoke to son, Rosanne AshingJim (on [place]).   Gave results of CT (no change compared to MRI 2016).  He verbalized understanding.   Pt has appt with pcp today, re: daily full sensation ears.  Gets frustrated/ stressed the causes the headaches.  Hoping things will settle down.  They will see pcp, and if things worsen or persist, will contact us.  She did not want to us tylenol, and will take aleve prn.

## 2015-06-26 NOTE — Telephone Encounter (Signed)
-----   Message from Nilda RiggsNancy Carolyn Martin, NP sent at 06/25/2015  4:33 PM EST ----- Please call normal MRI

## 2015-07-09 ENCOUNTER — Ambulatory Visit: Payer: Medicare Other | Admitting: Neurology

## 2015-07-20 DIAGNOSIS — E119 Type 2 diabetes mellitus without complications: Secondary | ICD-10-CM | POA: Diagnosis not present

## 2015-07-20 DIAGNOSIS — E785 Hyperlipidemia, unspecified: Secondary | ICD-10-CM | POA: Diagnosis not present

## 2015-07-20 DIAGNOSIS — E039 Hypothyroidism, unspecified: Secondary | ICD-10-CM | POA: Diagnosis not present

## 2015-07-20 DIAGNOSIS — E559 Vitamin D deficiency, unspecified: Secondary | ICD-10-CM | POA: Diagnosis not present

## 2015-07-20 DIAGNOSIS — R42 Dizziness and giddiness: Secondary | ICD-10-CM | POA: Diagnosis not present

## 2015-07-20 DIAGNOSIS — Z87440 Personal history of urinary (tract) infections: Secondary | ICD-10-CM | POA: Diagnosis not present

## 2015-07-20 DIAGNOSIS — I10 Essential (primary) hypertension: Secondary | ICD-10-CM | POA: Diagnosis not present

## 2015-07-20 DIAGNOSIS — R51 Headache: Secondary | ICD-10-CM | POA: Diagnosis not present

## 2015-07-20 DIAGNOSIS — F0390 Unspecified dementia without behavioral disturbance: Secondary | ICD-10-CM | POA: Diagnosis not present

## 2015-07-20 DIAGNOSIS — H409 Unspecified glaucoma: Secondary | ICD-10-CM | POA: Diagnosis not present

## 2015-07-20 DIAGNOSIS — F419 Anxiety disorder, unspecified: Secondary | ICD-10-CM | POA: Diagnosis not present

## 2015-07-22 DIAGNOSIS — E785 Hyperlipidemia, unspecified: Secondary | ICD-10-CM | POA: Diagnosis not present

## 2015-07-22 DIAGNOSIS — I10 Essential (primary) hypertension: Secondary | ICD-10-CM | POA: Diagnosis not present

## 2015-07-22 DIAGNOSIS — E119 Type 2 diabetes mellitus without complications: Secondary | ICD-10-CM | POA: Diagnosis not present

## 2015-07-22 DIAGNOSIS — R51 Headache: Secondary | ICD-10-CM | POA: Diagnosis not present

## 2015-07-22 DIAGNOSIS — F419 Anxiety disorder, unspecified: Secondary | ICD-10-CM | POA: Diagnosis not present

## 2015-07-22 DIAGNOSIS — E559 Vitamin D deficiency, unspecified: Secondary | ICD-10-CM | POA: Diagnosis not present

## 2015-07-22 DIAGNOSIS — E039 Hypothyroidism, unspecified: Secondary | ICD-10-CM | POA: Diagnosis not present

## 2015-07-22 DIAGNOSIS — F0390 Unspecified dementia without behavioral disturbance: Secondary | ICD-10-CM | POA: Diagnosis not present

## 2015-07-22 DIAGNOSIS — H409 Unspecified glaucoma: Secondary | ICD-10-CM | POA: Diagnosis not present

## 2015-07-29 ENCOUNTER — Encounter: Payer: Self-pay | Admitting: Neurology

## 2015-07-29 ENCOUNTER — Ambulatory Visit (INDEPENDENT_AMBULATORY_CARE_PROVIDER_SITE_OTHER): Payer: Medicare Other | Admitting: Neurology

## 2015-07-29 VITALS — BP 118/56 | HR 60 | Temp 96.7°F | Ht 64.0 in | Wt 155.0 lb

## 2015-07-29 DIAGNOSIS — G4489 Other headache syndrome: Secondary | ICD-10-CM

## 2015-07-29 DIAGNOSIS — F0391 Unspecified dementia with behavioral disturbance: Secondary | ICD-10-CM | POA: Diagnosis not present

## 2015-07-29 DIAGNOSIS — F03918 Unspecified dementia, unspecified severity, with other behavioral disturbance: Secondary | ICD-10-CM

## 2015-07-29 MED ORDER — DIVALPROEX SODIUM ER 250 MG PO TB24
250.0000 mg | ORAL_TABLET | Freq: Every day | ORAL | Status: DC
Start: 2015-07-29 — End: 2015-09-29
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
## 2015-07-29 NOTE — Patient Instructions (Signed)
Overall you are doing fairly well but I do want to suggest a few things today:   Remember to drink plenty of fluid, eat healthy meals and do not skip any meals. Try to eat protein with a every meal and eat a healthy snack such as fruit or nuts in between meals. Try to keep a regular sleep-wake schedule and try to exercise daily, particularly in the form of walking, 20-30 minutes a day, if you can.   As far as your medications are concerned, I would like to suggest: Depakote  at night  I would like to see you back in 1 month, sooner if we need to. Please call us with any interim questions, concerns, problems, updates or refill requests.   Our phone number is [phone]. We also have an after hours call service for urgent matters and there is a physician on-call for urgent questions. For any emergencies you know to call 911 or go to the nearest emergency room

## 2015-07-30 ENCOUNTER — Telehealth: Payer: Self-pay | Admitting: *Deleted

## 2015-07-30 LAB — COMPREHENSIVE METABOLIC PANEL
ALBUMIN: 4.2 g/dL (ref 3.5–4.7)
ALK PHOS: 83 IU/L (ref 39–117)
ALT: 10 IU/L (ref 0–32)
AST: 11 IU/L (ref 0–40)
Albumin/Globulin Ratio: 2 (ref 1.1–2.5)
BILIRUBIN TOTAL: 0.8 mg/dL (ref 0.0–1.2)
BUN / CREAT RATIO: 23 (ref 11–26)
BUN: 18 mg/dL (ref 8–27)
CHLORIDE: 101 mmol/L (ref 96–106)
CO2: 25 mmol/L (ref 18–29)
CREATININE: 0.77 mg/dL (ref 0.57–1.00)
Calcium: 9.2 mg/dL (ref 8.7–10.3)
GFR calc Af Amer: 79 mL/min/{1.73_m2} (ref >59)
GFR calc non Af Amer: 69 mL/min/{1.73_m2} (ref >59)
GLUCOSE: 93 mg/dL (ref 65–99)
Globulin, Total: 2.1 g/dL (ref 1.5–4.5)
Potassium: 4.9 mmol/L (ref 3.5–5.2)
Sodium: 142 mmol/L (ref 134–144)
Total Protein: 6.3 g/dL (ref 6.0–8.5)

## 2015-07-30 LAB — CBC
HEMATOCRIT: 38.1 % (ref 34.0–46.6)
HEMOGLOBIN: 12.6 g/dL (ref 11.1–15.9)
MCH: 28.9 pg (ref 26.6–33.0)
MCHC: 33.1 g/dL (ref 31.5–35.7)
MCV: 87 fL (ref 79–97)
Platelets: 287 10*3/uL (ref 150–379)
RBC: 4.36 x10E6/uL (ref 3.77–5.28)
RDW: 15.5 % — ABNORMAL HIGH (ref 12.3–15.4)
WBC: 5.6 10*3/uL (ref 3.4–10.8)

## 2015-07-30 LAB — SEDIMENTATION RATE: Sed Rate: 2 mm/hr (ref 0–40)

## 2015-07-30 LAB — C-REACTIVE PROTEIN: CRP: 1 mg/L (ref 0.0–4.9)

## 2015-07-30 NOTE — Telephone Encounter (Signed)
-----   Message from Anson Fret, MD sent at 07/30/2015  7:53 AM EST ----- All labs look good thanks

## 2015-07-30 NOTE — Telephone Encounter (Signed)
LVM for son to call about results. Gave [facility] phone number. Ok to inform son Rosanne Ashing) labs look good per Dr Lucia Gaskins.

## 2015-08-05 DIAGNOSIS — R51 Headache: Secondary | ICD-10-CM | POA: Diagnosis not present

## 2015-08-05 DIAGNOSIS — H409 Unspecified glaucoma: Secondary | ICD-10-CM | POA: Diagnosis not present

## 2015-08-05 DIAGNOSIS — E039 Hypothyroidism, unspecified: Secondary | ICD-10-CM | POA: Diagnosis not present

## 2015-08-05 DIAGNOSIS — E119 Type 2 diabetes mellitus without complications: Secondary | ICD-10-CM | POA: Diagnosis not present

## 2015-08-05 DIAGNOSIS — I10 Essential (primary) hypertension: Secondary | ICD-10-CM | POA: Diagnosis not present

## 2015-08-05 DIAGNOSIS — E559 Vitamin D deficiency, unspecified: Secondary | ICD-10-CM | POA: Diagnosis not present

## 2015-08-05 DIAGNOSIS — F0390 Unspecified dementia without behavioral disturbance: Secondary | ICD-10-CM | POA: Diagnosis not present

## 2015-08-05 DIAGNOSIS — E785 Hyperlipidemia, unspecified: Secondary | ICD-10-CM | POA: Diagnosis not present

## 2015-08-05 DIAGNOSIS — F419 Anxiety disorder, unspecified: Secondary | ICD-10-CM | POA: Diagnosis not present

## 2015-08-06 NOTE — Telephone Encounter (Signed)
LVM for son, Rosanne Ashing to call back about results. Ok to inform him labs normal per Dr Lucia Gaskins.  LVM for Melody (ok per DPR) to call back about results also. Please let her know about results if she calls back.

## 2015-08-12 NOTE — Telephone Encounter (Signed)
Called and LVM for son, Rosanne Ashing again to call about results. Will send results to both Rosanne Ashing and pt since unable to reach.

## 2015-08-31 ENCOUNTER — Inpatient Hospital Stay (HOSPITAL_COMMUNITY)
Admission: EM | Admit: 2015-08-31 | Discharge: 2015-09-02 | DRG: 440 | Disposition: A | Discharge status: Home Health | Payer: Medicare Other | Attending: Internal Medicine | Admitting: Internal Medicine

## 2015-08-31 ENCOUNTER — Inpatient Hospital Stay (HOSPITAL_COMMUNITY): Payer: Medicare Other

## 2015-08-31 ENCOUNTER — Encounter (HOSPITAL_COMMUNITY): Payer: Self-pay

## 2015-08-31 ENCOUNTER — Emergency Department (HOSPITAL_COMMUNITY): Payer: Medicare Other

## 2015-08-31 ENCOUNTER — Other Ambulatory Visit (HOSPITAL_COMMUNITY): Payer: Medicare Other

## 2015-08-31 DIAGNOSIS — Z66 Do not resuscitate: Secondary | ICD-10-CM | POA: Diagnosis present

## 2015-08-31 DIAGNOSIS — E785 Hyperlipidemia, unspecified: Secondary | ICD-10-CM | POA: Diagnosis present

## 2015-08-31 DIAGNOSIS — G3183 Dementia with Lewy bodies: Secondary | ICD-10-CM | POA: Diagnosis not present

## 2015-08-31 DIAGNOSIS — D649 Anemia, unspecified: Secondary | ICD-10-CM | POA: Diagnosis not present

## 2015-08-31 DIAGNOSIS — F0390 Unspecified dementia without behavioral disturbance: Secondary | ICD-10-CM | POA: Diagnosis not present

## 2015-08-31 DIAGNOSIS — R197 Diarrhea, unspecified: Secondary | ICD-10-CM | POA: Diagnosis not present

## 2015-08-31 DIAGNOSIS — E038 Other specified hypothyroidism: Secondary | ICD-10-CM | POA: Diagnosis present

## 2015-08-31 DIAGNOSIS — R935 Abnormal findings on diagnostic imaging of other abdominal regions, including retroperitoneum: Secondary | ICD-10-CM | POA: Diagnosis not present

## 2015-08-31 DIAGNOSIS — I10 Essential (primary) hypertension: Secondary | ICD-10-CM | POA: Diagnosis not present

## 2015-08-31 DIAGNOSIS — I517 Cardiomegaly: Secondary | ICD-10-CM | POA: Diagnosis not present

## 2015-08-31 DIAGNOSIS — K859 Acute pancreatitis without necrosis or infection, unspecified: Secondary | ICD-10-CM | POA: Diagnosis not present

## 2015-08-31 DIAGNOSIS — Z79899 Other long term (current) drug therapy: Secondary | ICD-10-CM | POA: Diagnosis not present

## 2015-08-31 DIAGNOSIS — K838 Other specified diseases of biliary tract: Secondary | ICD-10-CM | POA: Diagnosis not present

## 2015-08-31 DIAGNOSIS — R079 Chest pain, unspecified: Secondary | ICD-10-CM | POA: Diagnosis not present

## 2015-08-31 DIAGNOSIS — E039 Hypothyroidism, unspecified: Secondary | ICD-10-CM | POA: Diagnosis not present

## 2015-08-31 DIAGNOSIS — R001 Bradycardia, unspecified: Secondary | ICD-10-CM | POA: Diagnosis not present

## 2015-08-31 DIAGNOSIS — F0280 Dementia in other diseases classified elsewhere without behavioral disturbance: Secondary | ICD-10-CM | POA: Diagnosis not present

## 2015-08-31 LAB — BASIC METABOLIC PANEL
Anion gap: 14 (ref 5–15)
BUN: 27 mg/dL — AB (ref 6–20)
CO2: 22 mmol/L (ref 22–32)
CREATININE: 0.87 mg/dL (ref 0.44–1.00)
Calcium: 9.4 mg/dL (ref 8.9–10.3)
Chloride: 105 mmol/L (ref 101–111)
GFR calc Af Amer: >60 mL/min (ref >60)
GFR, EST NON AFRICAN AMERICAN: 57 mL/min — AB (ref >60)
GLUCOSE: 87 mg/dL (ref 65–99)
POTASSIUM: 3.7 mmol/L (ref 3.5–5.1)
SODIUM: 141 mmol/L (ref 135–145)

## 2015-08-31 LAB — GLUCOSE, CAPILLARY
GLUCOSE-CAPILLARY: 97 mg/dL (ref 65–99)
Glucose-Capillary: 95 mg/dL (ref 65–99)
Glucose-Capillary: 177 mg/dL — ABNORMAL HIGH (ref 65–99)
Glucose-Capillary: 110 mg/dL — ABNORMAL HIGH (ref 65–99)

## 2015-08-31 LAB — CBC
HEMATOCRIT: 37.2 % (ref 36.0–46.0)
Hemoglobin: 12.1 g/dL (ref 12.0–15.0)
MCH: 28.5 pg (ref 26.0–34.0)
MCHC: 32.5 g/dL (ref 30.0–36.0)
MCV: 87.7 fL (ref 78.0–100.0)
PLATELETS: 208 10*3/uL (ref 150–400)
RBC: 4.24 MIL/uL (ref 3.87–5.11)
RDW: 14.4 % (ref 11.5–15.5)
WBC: 9.1 10*3/uL (ref 4.0–10.5)

## 2015-08-31 LAB — LIPID PANEL
Cholesterol: 207 mg/dL — ABNORMAL HIGH (ref 0–200)
HDL: 44 mg/dL (ref >40)
LDL CALC: 147 mg/dL — AB (ref 0–99)
Total CHOL/HDL Ratio: 4.7 RATIO
Triglycerides: 81 mg/dL (ref <150)
VLDL: 16 mg/dL (ref 0–40)

## 2015-08-31 LAB — I-STAT TROPONIN, ED: Troponin i, poc: 0 ng/mL (ref 0.00–0.08)

## 2015-08-31 LAB — HEPATIC FUNCTION PANEL
ALT: 55 U/L — ABNORMAL HIGH (ref 14–54)
AST: 106 U/L — AB (ref 15–41)
Albumin: 3.5 g/dL (ref 3.5–5.0)
Alkaline Phosphatase: 84 U/L (ref 38–126)
BILIRUBIN DIRECT: 0.5 mg/dL (ref 0.1–0.5)
BILIRUBIN INDIRECT: 1.4 mg/dL — AB (ref 0.3–0.9)
BILIRUBIN TOTAL: 1.9 mg/dL — AB (ref 0.3–1.2)
Total Protein: 6.3 g/dL — ABNORMAL LOW (ref 6.5–8.1)

## 2015-08-31 LAB — TROPONIN I
Troponin I: <0.03 ng/mL (ref <0.031)
Troponin I: <0.03 ng/mL (ref <0.031)

## 2015-08-31 LAB — LIPASE, BLOOD: Lipase: 2667 U/L — ABNORMAL HIGH (ref 11–51)

## 2015-08-31 MED ORDER — VALPROATE SODIUM 500 MG/5ML IV SOLN
250.0000 mg | Freq: Every day | INTRAVENOUS | Status: DC
  Administered 2015-08-31: 250 mg via INTRAVENOUS
  Filled 2015-08-31: qty 2.5

## 2015-08-31 MED ORDER — MORPHINE SULFATE (PF) 2 MG/ML IV SOLN
1.0000 mg | INTRAVENOUS | Status: DC | PRN
  Filled 2015-08-31: qty 1

## 2015-08-31 MED ORDER — LEVOTHYROXINE SODIUM 100 MCG IV SOLR
68.5000 ug | Freq: Every day | INTRAVENOUS | Status: DC
  Administered 2015-08-31 – 2015-09-01 (×2): 68.5 ug via INTRAVENOUS
  Filled 2015-08-31 (×2): qty 5

## 2015-08-31 MED ORDER — TIMOLOL MALEATE 0.5 % OP SOLG
1.0000 [drp] | Freq: Every day | OPHTHALMIC | Status: DC
  Administered 2015-08-31 – 2015-09-02 (×3): 1 [drp] via OPHTHALMIC
  Filled 2015-08-31: qty 5

## 2015-08-31 MED ORDER — GADOBENATE DIMEGLUMINE 529 MG/ML IV SOLN
15.0000 mL | Freq: Once | INTRAVENOUS | Status: AC
Start: 2015-08-31 — End: 2015-08-31
  Administered 2015-08-31: 15 mL via INTRAVENOUS

## 2015-08-31 MED ORDER — LATANOPROST 0.005 % OP SOLN
1.0000 [drp] | Freq: Every day | OPHTHALMIC | Status: DC
  Administered 2015-08-31 – 2015-09-01 (×2): 1 [drp] via OPHTHALMIC
  Filled 2015-08-31 (×2): qty 2.5

## 2015-08-31 MED ORDER — ONDANSETRON HCL 4 MG PO TABS: 4.0000 mg | ORAL_TABLET | Freq: Four times a day (QID) | ORAL | Status: DC | PRN

## 2015-08-31 MED ORDER — SODIUM CHLORIDE 0.9 % IV SOLN
INTRAVENOUS | Status: AC
  Administered 2015-08-31: 20:00:00 via INTRAVENOUS

## 2015-08-31 MED ORDER — DIVALPROEX SODIUM ER 250 MG PO TB24
250.0000 mg | ORAL_TABLET | Freq: Every day | ORAL | Status: DC
  Administered 2015-09-01 – 2015-09-02 (×2): 250 mg via ORAL
  Filled 2015-08-31 (×2): qty 1

## 2015-08-31 MED ORDER — DONEPEZIL HCL 10 MG PO TABS
10.0000 mg | ORAL_TABLET | Freq: Every day | ORAL | Status: DC
  Administered 2015-08-31 – 2015-09-01 (×2): 10 mg via ORAL
  Filled 2015-08-31 (×2): qty 1

## 2015-08-31 MED ORDER — ONDANSETRON HCL 4 MG/2ML IJ SOLN: 4.0000 mg | Freq: Four times a day (QID) | INTRAMUSCULAR | Status: DC | PRN

## 2015-08-31 MED ORDER — HYDRALAZINE HCL 20 MG/ML IJ SOLN: 10.0000 mg | INTRAMUSCULAR | Status: DC | PRN

## 2015-08-31 MED ORDER — ETODOLAC 300 MG PO CAPS
500.0000 mg | ORAL_CAPSULE | Freq: Two times a day (BID) | ORAL | Status: DC
  Administered 2015-08-31 – 2015-09-01 (×3): 500 mg via ORAL
  Filled 2015-08-31 (×5): qty 1

## 2015-08-31 NOTE — H&P (Signed)
[facility] History and Physical  Colleen Huang WUJ:[id number] DOB: 1924-05-29 DOA: 08/31/2015  Referring physician: Dr. Bebe Shaggy. PCP: [name], MD  Specialists: Dr. [name]. Gastroenterologist.  Chief Complaint: Chest pain.  HPI: Colleen Huang is a 82 y.o. female with history of hypertension presently off medications, hyperlipidemia, hypothyroidism and dementia who was diagnosed with pancreatitis in December 2016 was not clear at the time, presents to the ER because of chest pain which happened suddenly last night. Lasted for half an hour retrosternal pressure-like nonradiating with no associated vomiting or diarrhea. In the ER EKG chest x-ray was unremarkable troponin was negative. Lipase was around 2000. At this time patient's chest pain is most likely secondary to acute pancreatitis and has been admitted for further management. During patient's last admission gastroenterologist has recommended if there is any further episodes to have MRCP done.   Review of Systems: As presented in the history of presenting illness, rest negative.  Past Medical History  Diagnosis Date  . Pancreatitis 04/2015   Past Surgical History  Procedure Laterality Date  . Appendectomy  1935  . Tonsillectomy  1949  . Parathyroidectomy / exploration of parathyroids  2003  . Gallbladder surgery  1997  . Colonoscopy  2005, 2009  . Flexible sigmoidoscopy  2005  . Rectal polypectomy  2005   Social History:  reports that she has never smoked. She does not have any smokeless tobacco history on file. She reports that she does not drink alcohol or use illicit drugs. Where does patient live Home. Can patient participate in ADLs? Yes.  Allergies  Allergen Reactions  . Codeine     Really bad reaction that's all she can remember   . Hydrocodone     Really bad reaction that's all she can remember   . Penicillins Itching, Swelling and Rash    Has patient had a PCN reaction causing immediate  rash, facial/tongue/throat swelling, SOB or lightheadedness with hypotension: Yes  Has patient had a PCN reaction causing severe rash involving mucus membranes or skin necrosis: Yes  Has patient had a PCN reaction that required hospitalization: No  Has patient had a PCN reaction occurring within the last 10 years: No  If all of the above answers are "NO", then may proceed with Cephalosporin use.   [name] Percocet [Oxycodone-Acetaminophen]     unknown  . Sulfa Antibiotics     Unknown reaction     Family History:  Family History  Problem Relation Age of Onset  . Dementia Brother       Prior to Admission medications   Medication Sig Start Date End Date Taking? Authorizing Provider  Calcium Carb-Cholecalciferol (CALCIUM 600 + D PO) Take 1 tablet by mouth 2 (two) times daily.    Yes Historical Provider, MD  divalproex (DEPAKOTE ER) 250 MG 24 hr tablet Take 1 tablet (250 mg total) by mouth daily. 07/29/15  Yes Anson Fret, MD  donepezil (ARICEPT) 10 MG tablet Take 1 tablet (10 mg total) by mouth at bedtime. 02/10/15  Yes Anson Fret, MD  etodolac (LODINE) 500 MG tablet Take 500 mg by mouth 2 (two) times daily as needed (pain).  07/22/15  Yes Historical Provider, MD  latanoprost (XALATAN) 0.005 % ophthalmic solution Place 1 drop into both eyes at bedtime.    Yes Historical Provider, MD  levothyroxine (SYNTHROID, LEVOTHROID) 137 MCG tablet Take 137 mcg by mouth daily before breakfast.   Yes Historical Provider, MD  Omega-3 Fatty Acids (FISH OIL PO) Take 1 capsule  by mouth 2 (two) times daily.   Yes Historical Provider, MD  timolol (TIMOPTIC-XR) 0.5 % ophthalmic gel-forming Place 1 drop into both eyes every morning.   Yes Historical Provider, MD    Physical Exam: Filed Vitals:   08/31/15 0300 08/31/15 0530 08/31/15 0545 08/31/15 0600  BP: 119/43 108/45 112/50 109/44  Pulse: 55 58 64 58  Temp:      TempSrc:      Resp: 13 18 20 12   Height:      Weight:      SpO2: 99% 96% 96% 95%      General:  Moderately built and nourished.  Eyes: Anicteric no pallor.  ENT: No discharge from the ears eyes nose and mouth.  Neck: No mass felt.  Cardiovascular: S1 and S2 heard.  Respiratory: No rhonchi or crepitations.  Abdomen: Soft nontender bowel sounds present. No guarding or rigidity.  Skin: No rash.  Musculoskeletal: No edema.  Psychiatric: Appears normal.  Neurologic: Alert awake oriented to time place and person. Moves all extremities.  Labs on Admission:  Basic Metabolic Panel:  Recent Labs Lab 08/31/15 0306  NA 141  K 3.7  CL 105  CO2 22  GLUCOSE 87  BUN 27*  CREATININE 0.87  CALCIUM 9.4   Liver Function Tests:  Recent Labs Lab 08/31/15 0306  AST 106*  ALT 55*  ALKPHOS 84  BILITOT 1.9*  PROT 6.3*  ALBUMIN 3.5    Recent Labs Lab 08/31/15 0306  LIPASE 2667*   No results for input(s): AMMONIA in the last 168 hours. CBC:  Recent Labs Lab 08/31/15 0306  WBC 9.1  HGB 12.1  HCT 37.2  MCV 87.7  PLT 208   Cardiac Enzymes:  Recent Labs Lab 08/31/15 0510  TROPONINI <0.03    BNP (last 3 results) No results for input(s): BNP in the last 8760 hours.  ProBNP (last 3 results) No results for input(s): PROBNP in the last 8760 hours.  CBG: No results for input(s): GLUCAP in the last 168 hours.  Radiological Exams on Admission: Dg Chest 2 View  08/31/2015  CLINICAL DATA:  Central chest pain, onset tonight. EXAM: CHEST  2 VIEW COMPARISON:  05/18/2015 FINDINGS: There is moderate cardiomegaly, unchanged. The lungs are clear. There is no pleural effusion. Hilar and mediastinal contours are unremarkable unchanged. IMPRESSION: Cardiomegaly.  No acute cardiopulmonary findings. Electronically Signed   By: Ellery Plunkaniel R Mitchell M.D.   On: 08/31/2015 02:32    EKG: Independently reviewed. Normal sinus rhythm.  Assessment/Plan Principal Problem:   Acute pancreatitis Active Problems:   Dyslipidemia   Benign essential HTN   Other  specified hypothyroidism   Chest pain   1. Acute pancreatitis with mildly elevated LFTs - will check MRCP. Patient will be kept nothing by mouth. IV fluids. Pain relief medications. 2. Chest pain most likely from acute pancreatitis. - See #1. Cycle cardiac markers. 3. Hypothyroidism - IV Synthroid until patient can take orally. 4. History of hypertension presently off medications. - Closely follow blood pressure trends. When necessary IV hydralazine for systolic blood pressure more than 160. 5. History of dementia - Aricept can be continued once patient can take orally. IV Depakote until patient can take oral Depakote.   DVT Prophylaxis SCDs.  Code Status: DO NOT RESUSCITATE as confirmed with patient's son.  Family Communication: [name] at the bedside.  Disposition Plan: Admit to inpatient.    Anaira Seay N. [facility] Pager [phone].  If 7PM-7AM, please contact night-coverage [email or web address] Password [email or web address] 08/31/2015, 6:12  AM        

## 2015-08-31 NOTE — Progress Notes (Signed)
Patient is very upset that she can not have anything to eat or drink.  I paged Dr Arthor CaptainElmahi to ask about something to eat or drink.  Dr Arthor CaptainElmahi said that he explained to the patient and to the son that at this time she will not be getting anything to eat or drink because we treat pancreatitis this way.  Patients need to allow the pancreas to rest.  Dr said that he would allow patient to have ice chips only at this time but nothing more than that.

## 2015-08-31 NOTE — Progress Notes (Signed)
Utilization review completed. Shonta Bourque, RN, BSN. 

## 2015-08-31 NOTE — ED Notes (Signed)
[facility]. Pt denies N/V,SOB. Pt given 324mg  ASA and now is in no pain.

## 2015-08-31 NOTE — ED Provider Notes (Signed)
CSN: [id number]     Arrival date & time 08/31/15  0152 History  By signing my name below, I, Colleen Huang, attest that this documentation has been prepared under the direction and in the presence of Zadie Rhineonald Joua Bake, MD. Electronically Signed: [name], ED Scribe. 08/31/2015. 3:08 AM    Chief Complaint  Patient presents with  . Chest Pain   Patient is a 82 y.o. female presenting with chest pain. The history is provided by the patient and a relative. No language interpreter was used.  Chest Pain Chest pain location: central chest. Pain radiates to:  Does not radiate Pain radiates to the back: no   Pain severity:  Moderate Onset quality:  Sudden Duration:  30 minutes Timing:  Constant Progression:  Resolved Chronicity:  New Context: at rest   Context: not breathing, no drug use, not eating, not lifting, no movement, no stress and no trauma   Relieved by:  Nothing Worsened by:  Nothing tried Ineffective treatments:  None tried Associated symptoms: no abdominal pain, no altered mental status, no anxiety, no back pain, no cough, no dizziness, no dysphagia, no fever, no headache, no heartburn, no lower extremity edema, no nausea, no near-syncope, no numbness, no orthopnea, no palpitations, no shortness of breath, not vomiting and no weakness   Risk factors: no aortic disease, no coronary artery disease, no diabetes mellitus, no Ehlers-Danlos syndrome, no high cholesterol, no hypertension, no immobilization, not female, not obese, not pregnant, no prior DVT/PE and no surgery    Brought in by EMS, Colleen Huang is a 82 y.o. female with history of pancreatitis who presents to the Emergency Department with her sons complaining of an episode of non-radiating central chest pain with onset last night. She is unable to describe the pain apart from saying "it just hurt" and noting that it is in a different location than the pain that she had last year with pancreatitis. The episode lasted  for 30 minutes before improving. Pt states "I think I had a panic attack" but denies feeling nervous or anxious. The pain has resolved and now her chest feels sore. She was given 324 mg of aspirin by EMS. Pt denies cough, SOB, and vomiting. No history of MI or DVT/PE.    Past Medical History  Diagnosis Date  . Pancreatitis 04/2015   Past Surgical History  Procedure Laterality Date  . Appendectomy  1935  . Tonsillectomy  1949  . Parathyroidectomy / exploration of parathyroids  2003  . Gallbladder surgery  1997  . Colonoscopy  2005, 2009  . Flexible sigmoidoscopy  2005  . Rectal polypectomy  2005   Family History  Problem Relation Age of Onset  . Dementia Brother    Social History  Substance Use Topics  . Smoking status: Never Smoker   . Smokeless tobacco: None  . Alcohol Use: No   OB History    No data available     Review of Systems  Constitutional: Negative for fever.  HENT: Negative for trouble swallowing.   Respiratory: Negative for cough and shortness of breath.   Cardiovascular: Positive for chest pain. Negative for palpitations, orthopnea and near-syncope.  Gastrointestinal: Negative for heartburn, nausea, vomiting and abdominal pain.  Musculoskeletal: Negative for back pain.  Neurological: Negative for dizziness, weakness, numbness and headaches.  Psychiatric/Behavioral: The patient is not nervous/anxious.   All other systems reviewed and are negative.  Allergies  Codeine; Hydrocodone; Penicillins; Percocet; and Sulfa antibiotics  Home Medications   Prior to Admission  medications   Medication Sig Start Date End Date Taking? Authorizing Provider  amLODipine (NORVASC) 10 MG tablet Take 10 mg by mouth every evening.  10/09/14   Historical Provider, MD  Calcium Carb-Cholecalciferol (CALCIUM 600 + D PO) Take 1 tablet by mouth 2 (two) times daily.     Historical Provider, MD  divalproex (DEPAKOTE ER) 250 MG 24 hr tablet Take 1 tablet (250 mg total) by mouth daily.  07/29/15   Anson Fret, MD  donepezil (ARICEPT) 10 MG tablet Take 1 tablet (10 mg total) by mouth at bedtime. 02/10/15   Anson Fret, MD  etodolac (LODINE) 500 MG tablet  07/22/15   Historical Provider, MD  latanoprost (XALATAN) 0.005 % ophthalmic solution Place 1 drop into both eyes at bedtime.     Historical Provider, MD  levothyroxine (SYNTHROID, LEVOTHROID) 50 MCG tablet Take 50 mcg by mouth daily. 06/26/15   Historical Provider, MD  LORazepam (ATIVAN) 0.5 MG tablet Take 0.5 mg by mouth 2 (two) times daily. 07/20/15   Historical Provider, MD  Omega-3 Fatty Acids (FISH OIL PO) Take 1 capsule by mouth 2 (two) times daily.    Historical Provider, MD   BP 116/68 mmHg  Pulse 64  Temp(Src) 97.9 F (36.6 C) (Oral)  Resp 15  Ht  (1.651 m)  Wt 151 lb (68.493 kg)  BMI 25.13 kg/m2  SpO2 98% Physical Exam CONSTITUTIONAL: Well developed/well nourished HEAD: Normocephalic/atraumatic EYES: EOMI/PERRL ENMT: Mucous membranes moist NECK: supple no meningeal signs SPINE/BACK:entire spine nontender CV: S1/S2 noted, no murmurs/rubs/gallops noted LUNGS: Lungs are clear to auscultation bilaterally, no apparent distress ABDOMEN: soft, nontender, no rebound or guarding, bowel sounds noted throughout abdomen GU:no cva tenderness NEURO: Pt is awake/alert/appropriate, moves all extremitiesx4.  No facial droop.   EXTREMITIES: pulses normal/equal, full ROM SKIN: warm, color normal PSYCH: no abnormalities of mood noted, alert and oriented to situation  ED Course  Procedures  DIAGNOSTIC STUDIES: Oxygen Saturation is 98% on RA, normal by my interpretation.    COORDINATION OF CARE: 2:35 AM Discussed treatment plan which includes lab work, CXR, EKG with pt at bedside and pt agreed to plan.  5:07 AM Pt with pancreatitis No abd pain at this time She is resting comfortably She had episode last December but lipase was never this elevated Def[facility] Ct imaging as pain controlled Will need inpatient  evaluation/admission D/w dr Toniann Fail for admission  Labs Review Labs Reviewed  BASIC METABOLIC PANEL - Abnormal; Notable for the following:    BUN 27 (*)    GFR calc non Af Amer 57 (*)    All other components within normal limits  LIPASE, BLOOD - Abnormal; Notable for the following:    Lipase 2667 (*)    All other components within normal limits  HEPATIC FUNCTION PANEL - Abnormal; Notable for the following:    Total Protein 6.3 (*)    AST 106 (*)    ALT 55 (*)    Total Bilirubin 1.9 (*)    Indirect Bilirubin 1.4 (*)    All other components within normal limits  CBC  TROPONIN I  I-STAT TROPOININ, ED    Imaging Review Dg Chest 2 View  08/31/2015  CLINICAL DATA:  Central chest pain, onset tonight. EXAM: CHEST  2 VIEW COMPARISON:  05/18/2015 FINDINGS: There is moderate cardiomegaly, unchanged. The lungs are clear. There is no pleural effusion. Hilar and mediastinal contours are unremarkable unchanged. IMPRESSION: Cardiomegaly.  No acute cardiopulmonary findings. Electronically Signed   By: Reuel Boom  Royce Macadamia M.D.   On: 08/31/2015 02:32   I have personally reviewed and evaluated these images and lab results as part of my medical decision-making.   EKG Interpretation   Date/Time:  Monday August 31 2015 01:58:03 EDT Ventricular Rate:  66 PR Interval:  185 QRS Duration: 101 QT Interval:  423 QTC Calculation: 443 R Axis:   12 Text Interpretation:  Sinus rhythm Abnormal R-wave progression, late  transition artifact noted No significant change since last tracing  Confirmed by Bebe Shaggy  MD, Akeyla Molden ([id number]) on 08/31/2015 2:22:35 AM      MDM   Final diagnoses:  Acute pancreatitis, unspecified complication status, unspecified pancreatitis type    Nursing notes including past medical history and social history reviewed and considered in documentation xrays/imaging reviewed by myself and considered during evaluation Labs/vital reviewed myself and considered during evaluation   I  personally performed the services described in this documentation, which was scribed in my presence. The recorded information has been reviewed and is accurate.       Zadie Rhine, MD 08/31/15 [phone]

## 2015-08-31 NOTE — Progress Notes (Signed)
[facility] PROGRESS NOTE  Colleen Huang QXI:[id number] DOB: Mar 06, 1924 DOA: 08/31/2015 PCP: Benito Mccreedy, MD  HPI: 82 year old female with a history of hypertension presently off medications, hyperlipidemia, hypothyroidism and dementia who was diagnosed with pancreatitis in December 2016 (was not clear at the time), presents to the ER because of chest pain which happened suddenly last night. Lasted for half an hour retrosternal pressure-like, non-radiating with no associated vomiting. Notes diarrhea over the last few days. In the ED, EKG and chest x-ray were unremarkable. Troponin was negative. Lipase was around 2000. Pt admitted for management of acute pancreatitis - likely the source of chest pain. During patient's last admission gastroenterologist recommended if any further episodes to have MRCP done.   Subjective:  Denies any complaint of chest pain, abdominal pain, nausea, or vomiting at this time. Only complaints are of feeling hungry and wanting to eat.  Assessment/Plan:  Questionable pancreatitis  - Admitted for same in December of 2016, presented with chest pain as well as epigastric pain at that time with elevated lipase. Gastroenterologist at that time recommended MRCP scan if pt experienced any further episodes - presented today with chest pain and elevated lipase ~2000, mildly elevated LFTs - MRI abdomen, including MRCP showed no evidence of acute pancreatitis, no peripancreatic fluid collections, and a normal pancreatic duct. There was dilatation of the common hepatic duct and common bile duct similar to comparison CT, likely combination of age and prior cholecystectomy - Continue IV fluids and pain control. -I will start her on clear liquids. Check lipase in a.m.  Chest pain - resolved, initially thought to be due to acute pancreatitis as this pain was similar to prior episode in December 2016 - initial EKG, chest x-ray, troponin negative - continue to monitor  troponin, metabolic panel - continuous cardiac monitoring  Hypothyroidism - continue synthroid  Hypertension - currently off medications, BP controlled - as needed IV hydralazine   Dementia - continue Aricept and Depakote  Code Status: Do not recucitate Family Communication: Son at bedside Disposition Plan: remain inpatient   Consultants:  None  Procedures:  None  Antibiotics:  None   Objective: Filed Vitals:   08/31/15 0600 08/31/15 0652  BP: 109/44 109/42  Pulse: 58 60  Temp:  98.4 F (36.9 C)  Resp: 12 18    Intake/Output Summary (Last 24 hours) at 08/31/15 1231 Last data filed at 08/31/15 1009  Gross per 24 hour  Intake      0 ml  Output      0 ml  Net      0 ml   Filed Weights   08/31/15 0201  Weight: 68.493 kg (151 lb)    Exam:   General:  Alert and oriented, in no acute distress  Cardiovascular: RRR  Respiratory: CTAB  Abdomen: Soft, non-distended, mild suprapubic tenderness to palpation  Musculoskeletal: No edema  Data Reviewed: Basic Metabolic Panel:  Recent Labs Lab 08/31/15 0306  NA 141  K 3.7  CL 105  CO2 22  GLUCOSE 87  BUN 27*  CREATININE 0.87  CALCIUM 9.4   Liver Function Tests:  Recent Labs Lab 08/31/15 0306  AST 106*  ALT 55*  ALKPHOS 84  BILITOT 1.9*  PROT 6.3*  ALBUMIN 3.5    Recent Labs Lab 08/31/15 0306  LIPASE 2667*   No results for input(s): AMMONIA in the last 168 hours. CBC:  Recent Labs Lab 08/31/15 0306  WBC 9.1  HGB 12.1  HCT 37.2  MCV 87.7  PLT 208  Cardiac Enzymes:  Recent Labs Lab 08/31/15 0510  TROPONINI <0.03   BNP (last 3 results) No results for input(s): BNP in the last 8760 hours.  ProBNP (last 3 results) No results for input(s): PROBNP in the last 8760 hours.  CBG:  Recent Labs Lab 08/31/15 0952 08/31/15 1214  GLUCAP 97 95    No results found for this or any previous visit (from the past 240 hour(s)).   Studies: Dg Chest 2 View  08/31/2015   CLINICAL DATA:  Central chest pain, onset tonight. EXAM: CHEST  2 VIEW COMPARISON:  05/18/2015 FINDINGS: There is moderate cardiomegaly, unchanged. The lungs are clear. There is no pleural effusion. Hilar and mediastinal contours are unremarkable unchanged. IMPRESSION: Cardiomegaly.  No acute cardiopulmonary findings. Electronically Signed   By: [name] M.D.   On: 08/31/2015 02:32   Mr 3d Recon At Scanner  08/31/2015  CLINICAL DATA:  hypertension presently off medications, hyperlipidemia, hypothyroidism and dementia who was diagnosed with pancreatitis in December 2016. Presents to the ER because of chest pain which happened suddenly. Concern for acute pancreatitis with elevated lipase an acute chest pain. EXAM: MRI ABDOMEN WITHOUT AND WITH CONTRAST (INCLUDING MRCP) TECHNIQUE: Multiplanar multisequence MR imaging of the abdomen was performed both before and after the administration of intravenous contrast. Heavily T2-weighted images of the biliary and pancreatic ducts were obtained, and three-dimensional MRCP images were rendered by post processing. CONTRAST:  12m MULTIHANCE GADOBENATE DIMEGLUMINE 529 MG/ML IV SOLN COMPARISON:  CT 05/14/2015 FINDINGS: Lower chest: Mild basilar atelectasis. No pleural fluid. No pericardial fluid. Hepatobiliary: No intrahepatic biliary duct dilatation. The common hepatic duct and the common bile duct are dilated to 9 mm which is similar to CT of 05/14/2015. Patient status post cholecystectomy . There is no obstructing lesion identified in the course of the common bile duct. No filling defect. Pancreas: There is no significant peripancreatic edema or inflammation identified. No peripancreatic fluid collections. The pancreatic duct is normal caliber. The distal pancreatic duct is slightly ectatic but appears to join the common bile duct in typical ductal anatomy. Small peri Pancreatic lymph nodes which are not pathologic by size criteria met measuring less than 8 mm short  axis. The pancreas enhances uniformly without evidence of lesion. Spleen: Normal spleen. Adrenals/urinary tract: Adrenal glands and kidneys are normal. Stomach/Bowel: Stomach and limited of the small bowel is unremarkable Vascular/Lymphatic: Abdominal aortic normal caliber. No retroperitoneal periportal lymphadenopathy. Musculoskeletal: No aggressive osseous lesion IMPRESSION: 1. No evidence of acute pancreatitis by MRI imaging. No peripancreatic fluid collections. The pancreatic duct is normal. 2. Dilatation of the common hepatic duct and common bile duct similar to comparison CT likely a combination of age and prior cholecystectomy. No filling defect in the common bile duct. 3. No hepatic lesion. Electronically Signed   By: SSuzy BouchardM.D.   On: 08/31/2015 10:11   Mr AJeananne RamaW/wo Cm/mrcp  08/31/2015  CLINICAL DATA:  hypertension presently off medications, hyperlipidemia, hypothyroidism and dementia who was diagnosed with pancreatitis in December 2016. Presents to the ER because of chest pain which happened suddenly. Concern for acute pancreatitis with elevated lipase an acute chest pain. EXAM: MRI ABDOMEN WITHOUT AND WITH CONTRAST (INCLUDING MRCP) TECHNIQUE: Multiplanar multisequence MR imaging of the abdomen was performed both before and after the administration of intravenous contrast. Heavily T2-weighted images of the biliary and pancreatic ducts were obtained, and three-dimensional MRCP images were rendered by post processing. CONTRAST:  161mMULTIHANCE GADOBENATE DIMEGLUMINE 529 MG/ML IV SOLN COMPARISON:  CT 05/14/2015  FINDINGS: Lower chest: Mild basilar atelectasis. No pleural fluid. No pericardial fluid. Hepatobiliary: No intrahepatic biliary duct dilatation. The common hepatic duct and the common bile duct are dilated to 9 mm which is similar to CT of 05/14/2015. Patient status post cholecystectomy . There is no obstructing lesion identified in the course of the common bile duct. No filling defect.  Pancreas: There is no significant peripancreatic edema or inflammation identified. No peripancreatic fluid collections. The pancreatic duct is normal caliber. The distal pancreatic duct is slightly ectatic but appears to join the common bile duct in typical ductal anatomy. Small peri Pancreatic lymph nodes which are not pathologic by size criteria met measuring less than 8 mm short axis. The pancreas enhances uniformly without evidence of lesion. Spleen: Normal spleen. Adrenals/urinary tract: Adrenal glands and kidneys are normal. Stomach/Bowel: Stomach and limited of the small bowel is unremarkable Vascular/Lymphatic: Abdominal aortic normal caliber. No retroperitoneal periportal lymphadenopathy. Musculoskeletal: No aggressive osseous lesion IMPRESSION: 1. No evidence of acute pancreatitis by MRI imaging. No peripancreatic fluid collections. The pancreatic duct is normal. 2. Dilatation of the common hepatic duct and common bile duct similar to comparison CT likely a combination of age and prior cholecystectomy. No filling defect in the common bile duct. 3. No hepatic lesion. Electronically Signed   By: [name] M.D.   On: 08/31/2015 10:11    Scheduled Meds: . latanoprost  1 drop Both Eyes QHS  . levothyroxine  68.5 mcg Intravenous Daily  . timolol  1 drop Both Eyes Daily  . valproate sodium  250 mg Intravenous Daily   Continuous Infusions: . sodium chloride      Principal Problem:   Acute pancreatitis Active Problems:   Dyslipidemia   Benign essential HTN   Other specified hypothyroidism   Chest pain    Time spent: [place], PA-S [facility] Pager [phone]. If 7PM-7AM, please contact night-coverage at [email or web address], password [facility] 08/31/2015, 12:31 PM  LOS: 0 days      Birdie Hopes Pager: [phone] 08/31/2015, 2:40 PM

## 2015-08-31 NOTE — Progress Notes (Signed)
Patient is having high anxiety and is very impatient at this time.  Explained to patient that i would speak with doctor about restarting some of her home medications.  Patient asked about restarting Depakote ER 250 mg, Aricept 10 mg at bedtime and Lodine 500 mg.  Md said to order those medications for her

## 2015-09-01 DIAGNOSIS — I10 Essential (primary) hypertension: Secondary | ICD-10-CM

## 2015-09-01 DIAGNOSIS — E785 Hyperlipidemia, unspecified: Secondary | ICD-10-CM

## 2015-09-01 LAB — CBC
HEMATOCRIT: 34 % — AB (ref 36.0–46.0)
Hemoglobin: 10.9 g/dL — ABNORMAL LOW (ref 12.0–15.0)
MCH: 28.2 pg (ref 26.0–34.0)
MCHC: 32.1 g/dL (ref 30.0–36.0)
MCV: 87.9 fL (ref 78.0–100.0)
PLATELETS: 199 10*3/uL (ref 150–400)
RBC: 3.87 MIL/uL (ref 3.87–5.11)
RDW: 14.7 % (ref 11.5–15.5)
WBC: 4.8 10*3/uL (ref 4.0–10.5)

## 2015-09-01 LAB — C DIFFICILE QUICK SCREEN W PCR REFLEX
C Diff antigen: NEGATIVE
C Diff interpretation: NEGATIVE
C Diff toxin: NEGATIVE

## 2015-09-01 LAB — BASIC METABOLIC PANEL
Anion gap: 8 (ref 5–15)
BUN: 17 mg/dL (ref 6–20)
CHLORIDE: 109 mmol/L (ref 101–111)
CO2: 24 mmol/L (ref 22–32)
CREATININE: 0.78 mg/dL (ref 0.44–1.00)
Calcium: 8.6 mg/dL — ABNORMAL LOW (ref 8.9–10.3)
GFR calc Af Amer: >60 mL/min (ref >60)
GFR calc non Af Amer: >60 mL/min (ref >60)
GLUCOSE: 101 mg/dL — AB (ref 65–99)
POTASSIUM: 4.3 mmol/L (ref 3.5–5.1)
Sodium: 141 mmol/L (ref 135–145)

## 2015-09-01 LAB — GLUCOSE, CAPILLARY
GLUCOSE-CAPILLARY: 109 mg/dL — AB (ref 65–99)
GLUCOSE-CAPILLARY: 122 mg/dL — AB (ref 65–99)
Glucose-Capillary: 86 mg/dL (ref 65–99)
Glucose-Capillary: 70 mg/dL (ref 65–99)

## 2015-09-01 LAB — HEPATIC FUNCTION PANEL
ALBUMIN: 3.1 g/dL — AB (ref 3.5–5.0)
ALK PHOS: 90 U/L (ref 38–126)
ALT: 83 U/L — AB (ref 14–54)
AST: 64 U/L — AB (ref 15–41)
BILIRUBIN TOTAL: 1.5 mg/dL — AB (ref 0.3–1.2)
Bilirubin, Direct: 0.3 mg/dL (ref 0.1–0.5)
Indirect Bilirubin: 1.2 mg/dL — ABNORMAL HIGH (ref 0.3–0.9)
Total Protein: 5.7 g/dL — ABNORMAL LOW (ref 6.5–8.1)

## 2015-09-01 MED ORDER — SACCHAROMYCES BOULARDII 250 MG PO CAPS
250.0000 mg | ORAL_CAPSULE | Freq: Two times a day (BID) | ORAL | Status: DC
  Administered 2015-09-01 – 2015-09-02 (×3): 250 mg via ORAL
  Filled 2015-09-01 (×3): qty 1

## 2015-09-01 MED ORDER — LOPERAMIDE HCL 2 MG PO CAPS
2.0000 mg | ORAL_CAPSULE | ORAL | Status: DC | PRN
  Administered 2015-09-01: 2 mg via ORAL
  Filled 2015-09-01: qty 1

## 2015-09-01 MED ORDER — ETODOLAC 300 MG PO CAPS
500.0000 mg | ORAL_CAPSULE | Freq: Two times a day (BID) | ORAL | Status: DC | PRN
  Administered 2015-09-02 (×2): 500 mg via ORAL
  Filled 2015-09-01 (×4): qty 1

## 2015-09-01 MED ORDER — LEVOTHYROXINE SODIUM 25 MCG PO TABS
137.0000 ug | ORAL_TABLET | Freq: Every day | ORAL | Status: DC
  Administered 2015-09-02: 137 ug via ORAL
  Filled 2015-09-01: qty 1

## 2015-09-01 NOTE — Progress Notes (Signed)
[facility] PROGRESS NOTE  EARLISHA SHARPLES JXB:[id number] DOB: [birth year] DOA: 08/31/2015 PCP: Benito Mccreedy, MD  HPI: 82 year old female with a history of hypertension presently off medications, hyperlipidemia, hypothyroidism and dementia who was diagnosed with pancreatitis in December 2016 (was not clear at the time), presents to the ER because of chest pain which happened suddenly last night. Lasted for half an hour retrosternal, pressure-like, non-radiating with no associated vomiting. Notes diarrhea over the last few days. In the ED, EKG and chest x-ray were unremarkable. Troponin was negative. Lipase was around 2000. Pt admitted for management of acute pancreatitis - likely the source of chest pain. During patient's last admission gastroenterologist recommended if any further episodes to have MRCP done.  MRI with MRCP showed no evidence of acute pancreatitis at this time.  Subjective: Denies any complaint of chest pain, nausea, or vomiting at this time. She does report mild suprapubic pain and diarrhea over the last several days.  Assessment/Plan:  Questionable pancreatitis  - admitted for same in December of 2016, presented with chest pain as well as epigastric pain at that time with elevated lipase. Gastroenterologist at that time recommended MRCP scan if pt experienced any further episodes - presented today with chest pain and elevated lipase ~2000, mildly elevated LFTs - MRI abdomen, including MRCP showed no evidence of acute pancreatitis, questioning the significance of lipase elevation. - pt received IVF and tolerated clear liquids without complaint, will advance to soft diet at this time and continue to monitor - LFTs are still mildly elevated, will repeat metabolic panel in the AM - if tolerates diet can consider D/C in AM  Chest pain - resolved, initially thought to be due to acute pancreatitis as this pain was similar to prior episode in December 2016 - initial  EKG, chest x-ray, troponin x 3 negative - place on continuous cardiac monitoring.  Bradycardia -Patient has sinus bradycardia, she is on telemetry. Completely asymptomatic. -No AV nodal blocking medication but is on our setup. -If continues to be bradycardic with light need to hold Aricept.  Diarrhea - likely not of infectious etiology, C. Diff negative - started on imodium and florastor  Anemia - Hgb of 10.9 today, appears dilutional, baseline Hgb of ~12.0  Hypothyroidism - continue synthroid  Hypertension - currently off medications, BP controlled - as needed IV hydralazine   Dementia - continue Aricept and Depakote   Code Status: Do not recucitate Family Communication: Son Disposition Plan: Remain inpatient   Consultants:  None  Procedures:  None  Antibiotics:  None   Objective: Filed Vitals:   08/31/15 2108 09/01/15 0552  BP: 110/44 141/50  Pulse: 51 94  Temp: 98.7 F (37.1 C) 97.6 F (36.4 C)  Resp: 20 18    Intake/Output Summary (Last 24 hours) at 09/01/15 1020 Last data filed at 09/01/15 0954  Gross per 24 hour  Intake 1136.67 ml  Output    815 ml  Net 321.67 ml   Filed Weights   08/31/15 0201 09/01/15 0742  Weight: 68.493 kg (151 lb) 68.72 kg (151 lb 8 oz)    Exam:   General: Alert and oriented, in no acute distress  Cardiovascular: RRR  Respiratory: CTAB  Abdomen: Soft, non-distended, mild suprapubic tenderness to palpation  Musculoskeletal: No edema  Data Reviewed: Basic Metabolic Panel:  Recent Labs Lab 08/31/15 0306 09/01/15 0420  NA 141 141  K 3.7 4.3  CL 105 109  CO2 22 24  GLUCOSE 87 101*  BUN 27* 17  CREATININE 0.87  0.78  CALCIUM 9.4 8.6*   Liver Function Tests:  Recent Labs Lab 08/31/15 0306 09/01/15 0420  AST 106* 64*  ALT 55* 83*  ALKPHOS 84 90  BILITOT 1.9* 1.5*  PROT 6.3* 5.7*  ALBUMIN 3.5 3.1*    Recent Labs Lab 08/31/15 0306  LIPASE 2667*   No results for input(s): AMMONIA in the  last 168 hours. CBC:  Recent Labs Lab 08/31/15 0306 09/01/15 0420  WBC 9.1 4.8  HGB 12.1 10.9*  HCT 37.2 34.0*  MCV 87.7 87.9  PLT 208 199   Cardiac Enzymes:  Recent Labs Lab 08/31/15 0510 08/31/15 1210 08/31/15 1803  TROPONINI <0.03 <0.03 <0.03   BNP (last 3 results) No results for input(s): BNP in the last 8760 hours.  ProBNP (last 3 results) No results for input(s): PROBNP in the last 8760 hours.  CBG:  Recent Labs Lab 08/31/15 0952 08/31/15 1214 08/31/15 1606 08/31/15 2009 09/01/15 0814  GLUCAP 97 95 177* 110* 86    Recent Results (from the past 240 hour(s))  C difficile quick scan w PCR reflex     Status: None   Collection Time: 09/01/15  1:15 AM  Result Value Ref Range Status   C Diff antigen NEGATIVE NEGATIVE Final   C Diff toxin NEGATIVE NEGATIVE Final   C Diff interpretation Negative for toxigenic C. difficile  Final     Studies: Dg Chest 2 View  08/31/2015  CLINICAL DATA:  Central chest pain, onset tonight. EXAM: CHEST  2 VIEW COMPARISON:  05/18/2015 FINDINGS: There is moderate cardiomegaly, unchanged. The lungs are clear. There is no pleural effusion. Hilar and mediastinal contours are unremarkable unchanged. IMPRESSION: Cardiomegaly.  No acute cardiopulmonary findings. Electronically Signed   By: [name] M.D.   On: 08/31/2015 02:32   Mr 3d Recon At Scanner  08/31/2015  CLINICAL DATA:  hypertension presently off medications, hyperlipidemia, hypothyroidism and dementia who was diagnosed with pancreatitis in December 2016. Presents to the ER because of chest pain which happened suddenly. Concern for acute pancreatitis with elevated lipase an acute chest pain. EXAM: MRI ABDOMEN WITHOUT AND WITH CONTRAST (INCLUDING MRCP) TECHNIQUE: Multiplanar multisequence MR imaging of the abdomen was performed both before and after the administration of intravenous contrast. Heavily T2-weighted images of the biliary and pancreatic ducts were obtained, and  three-dimensional MRCP images were rendered by post processing. CONTRAST:  94m MULTIHANCE GADOBENATE DIMEGLUMINE 529 MG/ML IV SOLN COMPARISON:  CT 05/14/2015 FINDINGS: Lower chest: Mild basilar atelectasis. No pleural fluid. No pericardial fluid. Hepatobiliary: No intrahepatic biliary duct dilatation. The common hepatic duct and the common bile duct are dilated to 9 mm which is similar to CT of 05/14/2015. Patient status post cholecystectomy . There is no obstructing lesion identified in the course of the common bile duct. No filling defect. Pancreas: There is no significant peripancreatic edema or inflammation identified. No peripancreatic fluid collections. The pancreatic duct is normal caliber. The distal pancreatic duct is slightly ectatic but appears to join the common bile duct in typical ductal anatomy. Small peri Pancreatic lymph nodes which are not pathologic by size criteria met measuring less than 8 mm short axis. The pancreas enhances uniformly without evidence of lesion. Spleen: Normal spleen. Adrenals/urinary tract: Adrenal glands and kidneys are normal. Stomach/Bowel: Stomach and limited of the small bowel is unremarkable Vascular/Lymphatic: Abdominal aortic normal caliber. No retroperitoneal periportal lymphadenopathy. Musculoskeletal: No aggressive osseous lesion IMPRESSION: 1. No evidence of acute pancreatitis by MRI imaging. No peripancreatic fluid collections. The pancreatic duct is  normal. 2. Dilatation of the common hepatic duct and common bile duct similar to comparison CT likely a combination of age and prior cholecystectomy. No filling defect in the common bile duct. 3. No hepatic lesion. Electronically Signed   By: [name] M.D.   On: 08/31/2015 10:11   Mr Jeananne Rama W/wo Cm/mrcp  08/31/2015  CLINICAL DATA:  hypertension presently off medications, hyperlipidemia, hypothyroidism and dementia who was diagnosed with pancreatitis in December 2016. Presents to the ER because of chest pain  which happened suddenly. Concern for acute pancreatitis with elevated lipase an acute chest pain. EXAM: MRI ABDOMEN WITHOUT AND WITH CONTRAST (INCLUDING MRCP) TECHNIQUE: Multiplanar multisequence MR imaging of the abdomen was performed both before and after the administration of intravenous contrast. Heavily T2-weighted images of the biliary and pancreatic ducts were obtained, and three-dimensional MRCP images were rendered by post processing. CONTRAST:  14m MULTIHANCE GADOBENATE DIMEGLUMINE 529 MG/ML IV SOLN COMPARISON:  CT 05/14/2015 FINDINGS: Lower chest: Mild basilar atelectasis. No pleural fluid. No pericardial fluid. Hepatobiliary: No intrahepatic biliary duct dilatation. The common hepatic duct and the common bile duct are dilated to 9 mm which is similar to CT of 05/14/2015. Patient status post cholecystectomy . There is no obstructing lesion identified in the course of the common bile duct. No filling defect. Pancreas: There is no significant peripancreatic edema or inflammation identified. No peripancreatic fluid collections. The pancreatic duct is normal caliber. The distal pancreatic duct is slightly ectatic but appears to join the common bile duct in typical ductal anatomy. Small peri Pancreatic lymph nodes which are not pathologic by size criteria met measuring less than 8 mm short axis. The pancreas enhances uniformly without evidence of lesion. Spleen: Normal spleen. Adrenals/urinary tract: Adrenal glands and kidneys are normal. Stomach/Bowel: Stomach and limited of the small bowel is unremarkable Vascular/Lymphatic: Abdominal aortic normal caliber. No retroperitoneal periportal lymphadenopathy. Musculoskeletal: No aggressive osseous lesion IMPRESSION: 1. No evidence of acute pancreatitis by MRI imaging. No peripancreatic fluid collections. The pancreatic duct is normal. 2. Dilatation of the common hepatic duct and common bile duct similar to comparison CT likely a combination of age and prior  cholecystectomy. No filling defect in the common bile duct. 3. No hepatic lesion. Electronically Signed   By: SSuzy BouchardM.D.   On: 08/31/2015 10:11    Scheduled Meds: . divalproex  250 mg Oral Daily  . donepezil  10 mg Oral QHS  . etodolac  500 mg Oral BID  . latanoprost  1 drop Both Eyes QHS  . levothyroxine  68.5 mcg Intravenous Daily  . timolol  1 drop Both Eyes Daily   Continuous Infusions:   Principal Problem:   Acute pancreatitis Active Problems:   Dyslipidemia   Benign essential HTN   Other specified hypothyroidism   Chest pain    Time spent: 3[place] PA-S [facility] Pager [phone] If 7PM-7AM, please contact night-coverage at [email or web address], password [facility]3/21/2017, 10:20 AM  LOS: 1 day      MBirdie HopesPager: ([phone]/21/2017, 3:25 PM

## 2015-09-02 DIAGNOSIS — F0280 Dementia in other diseases classified elsewhere without behavioral disturbance: Secondary | ICD-10-CM

## 2015-09-02 DIAGNOSIS — K859 Acute pancreatitis without necrosis or infection, unspecified: Principal | ICD-10-CM

## 2015-09-02 DIAGNOSIS — G3183 Dementia with Lewy bodies: Secondary | ICD-10-CM

## 2015-09-02 DIAGNOSIS — E038 Other specified hypothyroidism: Secondary | ICD-10-CM

## 2015-09-02 LAB — COMPREHENSIVE METABOLIC PANEL
ALBUMIN: 3.2 g/dL — AB (ref 3.5–5.0)
ALK PHOS: 90 U/L (ref 38–126)
ALT: 62 U/L — ABNORMAL HIGH (ref 14–54)
AST: 33 U/L (ref 15–41)
Anion gap: 10 (ref 5–15)
BILIRUBIN TOTAL: 1.1 mg/dL (ref 0.3–1.2)
BUN: 14 mg/dL (ref 6–20)
CALCIUM: 9.2 mg/dL (ref 8.9–10.3)
CO2: 23 mmol/L (ref 22–32)
Chloride: 109 mmol/L (ref 101–111)
Creatinine, Ser: 0.75 mg/dL (ref 0.44–1.00)
GFR calc Af Amer: >60 mL/min (ref >60)
GFR calc non Af Amer: >60 mL/min (ref >60)
Glucose, Bld: 97 mg/dL (ref 65–99)
Potassium: 3.8 mmol/L (ref 3.5–5.1)
SODIUM: 142 mmol/L (ref 135–145)
TOTAL PROTEIN: 6.2 g/dL — AB (ref 6.5–8.1)

## 2015-09-02 LAB — GLUCOSE, CAPILLARY
Glucose-Capillary: 83 mg/dL (ref 65–99)
Glucose-Capillary: 104 mg/dL — ABNORMAL HIGH (ref 65–99)

## 2015-09-02 MED ORDER — PANTOPRAZOLE SODIUM 40 MG PO TBEC: 40.0000 mg | DELAYED_RELEASE_TABLET | Freq: Every day | ORAL | Status: DC

## 2015-09-02 MED ORDER — LOPERAMIDE HCL 2 MG PO CAPS
4.0000 mg | ORAL_CAPSULE | Freq: Once | ORAL | Status: DC
Start: 2015-09-02 — End: 2015-09-02

## 2015-09-02 MED ORDER — ALPRAZOLAM 0.25 MG PO TABS
0.2500 mg | ORAL_TABLET | Freq: Three times a day (TID) | ORAL | Status: DC | PRN
Start: 2015-09-02 — End: 2015-09-02
  Administered 2015-09-02: 0.25 mg via ORAL
  Filled 2015-09-02: qty 1

## 2015-09-02 NOTE — Progress Notes (Signed)
Patient was discharged home by MD order; discharged instructions  review and give to patient and her son with care notes; IV DIC; skin intact; patient will be escorted to the car by nurse tech via wheelchair.

## 2015-09-02 NOTE — Progress Notes (Signed)
Patient walking in the hallway with her son, using a walker.

## 2015-09-02 NOTE — Care Management Note (Signed)
Case Management Note  Patient Details  Name: Colleen Huang MRN: [id number] Date of Birth: 09/19/1923  Subjective/Objective:   [age] y.o. F admitted 3/20 with Acute Pancreatitis. HHPT/HHRN have been arranged with [facility] which was chosen by Adult son. He reports they have used [facility] in the past. Lupita LeashDonna[facility].  Anticipate discharge home today. No further CM needs but will be available should additional discharge needs arise.                Action/Plan:   Expected Discharge Date:                  Expected Discharge Plan:     In-House Referral:     Discharge planning Services  CM Consult  Post Acute Care Choice:    Choice offered to:  Adult Children (Adult Son by phone)  DME Arranged:   (Has DME) DME Agency:     HH Arranged:  RN, PT HH Agency:  [facility]  Status of Service:  Completed, signed off  Medicare Important Message Given:    Date Medicare IM Given:    Medicare IM give by:    Date Additional Medicare IM Given:    Additional Medicare Important Message give by:     If discussed at Long Length of Stay Meetings, dates discussed:    Additional Comments:  Yvone NeuCrutchfield, Conrad Zajkowski M, RN 09/02/2015, 1:57 PM

## 2015-09-02 NOTE — Discharge Summary (Signed)
PATIENT DETAILS Name: Colleen Huang Age: 82 y.o. Sex: female Date of Birth: Mar 18, 1924 MRN: [id number]. Admitting Physician: Colleen Patience, MD XTA:[id number], MD  Admit Date: 08/31/2015 Discharge date: 09/02/2015  Recommendations for Outpatient Follow-up:  1. Ensure follow up with GI MD-Colleen Huang for evaluation of idiopathic pancreatitis 2. Please repeat CBC/BMET at next visit  PRIMARY DISCHARGE DIAGNOSIS:  Principal Problem:   Acute pancreatitis Active Problems:   Dyslipidemia   Benign essential HTN   Other specified hypothyroidism   Chest pain      PAST MEDICAL HISTORY: Past Medical History  Diagnosis Date  . Pancreatitis 04/2015    DISCHARGE MEDICATIONS: Current Discharge Medication List    START taking these medications   Details  pantoprazole (PROTONIX) 40 MG tablet Take 1 tablet (40 mg total) by mouth daily. Qty: 30 tablet, Refills: 0      CONTINUE these medications which have NOT CHANGED   Details  Calcium Carb-Cholecalciferol (CALCIUM 600 + D PO) Take 1 tablet by mouth 2 (two) times daily.     divalproex (DEPAKOTE ER) 250 MG 24 hr tablet Take 1 tablet (250 mg total) by mouth daily. Qty: 30 tablet, Refills: 12    donepezil (ARICEPT) 10 MG tablet Take 1 tablet (10 mg total) by mouth at bedtime. Qty: 30 tablet, Refills: 11    etodolac (LODINE) 500 MG tablet Take 500 mg by mouth 2 (two) times daily as needed (pain).  Refills: 0    latanoprost (XALATAN) 0.005 % ophthalmic solution Place 1 drop into both eyes at bedtime.     levothyroxine (SYNTHROID, LEVOTHROID) 137 MCG tablet Take 137 mcg by mouth daily before breakfast.    Omega-3 Fatty Acids (FISH OIL PO) Take 1 capsule by mouth 2 (two) times daily.    timolol (TIMOPTIC-XR) 0.5 % ophthalmic gel-forming Place 1 drop into both eyes every morning.        ALLERGIES:   Allergies  Allergen Reactions  . Codeine     Really bad reaction that's all she can remember   . Hydrocodone     Really bad reaction that's all she can remember   . Penicillins Itching, Swelling and Rash    Has patient had a PCN reaction causing immediate rash, facial/tongue/throat swelling, SOB or lightheadedness with hypotension: Yes  Has patient had a PCN reaction causing severe rash involving mucus membranes or skin necrosis: Yes  Has patient had a PCN reaction that required hospitalization: No  Has patient had a PCN reaction occurring within the last 10 years: No  If all of the above answers are "NO", then may proceed with Cephalosporin use.   [name] Percocet [Oxycodone-Acetaminophen]     unknown  . Sulfa Antibiotics     Unknown reaction     BRIEF HPI:  See H&P, Labs, Consult and Test reports for all details in brief, patient was admitted for evaluation of epigastric/chest pain. She was found to have pancreatitis, and admitted for further evaluation and treatment  CONSULTATIONS:   None  PERTINENT RADIOLOGIC STUDIES: Dg Chest 2 View  08/31/2015  CLINICAL DATA:  Central chest pain, onset tonight. EXAM: CHEST  2 VIEW COMPARISON:  05/18/2015 FINDINGS: There is moderate cardiomegaly, unchanged. The lungs are clear. There is no pleural effusion. Hilar and mediastinal contours are unremarkable unchanged. IMPRESSION: Cardiomegaly.  No acute cardiopulmonary findings. Electronically Signed   By: Colleen Newport M.D.   On: 08/31/2015 02:32   Mr 3d Recon At Scanner  08/31/2015  CLINICAL DATA:  hypertension presently  off medications, hyperlipidemia, hypothyroidism and dementia who was diagnosed with pancreatitis in December 2016. Presents to the ER because of chest pain which happened suddenly. Concern for acute pancreatitis with elevated lipase an acute chest pain. EXAM: MRI ABDOMEN WITHOUT AND WITH CONTRAST (INCLUDING MRCP) TECHNIQUE: Multiplanar multisequence MR imaging of the abdomen was performed both before and after the administration of intravenous contrast. Heavily T2-weighted images of the biliary and  pancreatic ducts were obtained, and three-dimensional MRCP images were rendered by post processing. CONTRAST:  38m MULTIHANCE GADOBENATE DIMEGLUMINE 529 MG/ML IV SOLN COMPARISON:  CT 05/14/2015 FINDINGS: Lower chest: Mild basilar atelectasis. No pleural fluid. No pericardial fluid. Hepatobiliary: No intrahepatic biliary duct dilatation. The common hepatic duct and the common bile duct are dilated to 9 mm which is similar to CT of 05/14/2015. Patient status post cholecystectomy . There is no obstructing lesion identified in the course of the common bile duct. No filling defect. Pancreas: There is no significant peripancreatic edema or inflammation identified. No peripancreatic fluid collections. The pancreatic duct is normal caliber. The distal pancreatic duct is slightly ectatic but appears to join the common bile duct in typical ductal anatomy. Small peri Pancreatic lymph nodes which are not pathologic by size criteria met measuring less than 8 mm short axis. The pancreas enhances uniformly without evidence of lesion. Spleen: Normal spleen. Adrenals/urinary tract: Adrenal glands and kidneys are normal. Stomach/Bowel: Stomach and limited of the small bowel is unremarkable Vascular/Lymphatic: Abdominal aortic normal caliber. No retroperitoneal periportal lymphadenopathy. Musculoskeletal: No aggressive osseous lesion IMPRESSION: 1. No evidence of acute pancreatitis by MRI imaging. No peripancreatic fluid collections. The pancreatic duct is normal. 2. Dilatation of the common hepatic duct and common bile duct similar to comparison CT likely a combination of age and prior cholecystectomy. No filling defect in the common bile duct. 3. No hepatic lesion. Electronically Signed   By: Colleen BouchardM.D.   On: 08/31/2015 10:11   Mr Colleen Huang/wo Cm/mrcp  08/31/2015  CLINICAL DATA:  hypertension presently off medications, hyperlipidemia, hypothyroidism and dementia who was diagnosed with pancreatitis in December 2016.  Presents to the ER because of chest pain which happened suddenly. Concern for acute pancreatitis with elevated lipase an acute chest pain. EXAM: MRI ABDOMEN WITHOUT AND WITH CONTRAST (INCLUDING MRCP) TECHNIQUE: Multiplanar multisequence MR imaging of the abdomen was performed both before and after the administration of intravenous contrast. Heavily T2-weighted images of the biliary and pancreatic ducts were obtained, and three-dimensional MRCP images were rendered by post processing. CONTRAST:  156mMULTIHANCE GADOBENATE DIMEGLUMINE 529 MG/ML IV SOLN COMPARISON:  CT 05/14/2015 FINDINGS: Lower chest: Mild basilar atelectasis. No pleural fluid. No pericardial fluid. Hepatobiliary: No intrahepatic biliary duct dilatation. The common hepatic duct and the common bile duct are dilated to 9 mm which is similar to CT of 05/14/2015. Patient status post cholecystectomy . There is no obstructing lesion identified in the course of the common bile duct. No filling defect. Pancreas: There is no significant peripancreatic edema or inflammation identified. No peripancreatic fluid collections. The pancreatic duct is normal caliber. The distal pancreatic duct is slightly ectatic but appears to join the common bile duct in typical ductal anatomy. Small peri Pancreatic lymph nodes which are not pathologic by size criteria met measuring less than 8 mm short axis. The pancreas enhances uniformly without evidence of lesion. Spleen: Normal spleen. Adrenals/urinary tract: Adrenal glands and kidneys are normal. Stomach/Bowel: Stomach and limited of the small bowel is unremarkable Vascular/Lymphatic: Abdominal aortic normal caliber. No retroperitoneal periportal  lymphadenopathy. Musculoskeletal: No aggressive osseous lesion IMPRESSION: 1. No evidence of acute pancreatitis by MRI imaging. No peripancreatic fluid collections. The pancreatic duct is normal. 2. Dilatation of the common hepatic duct and common bile duct similar to comparison CT  likely a combination of age and prior cholecystectomy. No filling defect in the common bile duct. 3. No hepatic lesion. Electronically Signed   By: [name] M.D.   On: 08/31/2015 10:11     PERTINENT LAB RESULTS: CBC:  Recent Labs  08/31/15 0306 09/01/15 0420  WBC 9.1 4.8  HGB 12.1 10.9*  HCT 37.2 34.0*  PLT 208 199   CMET CMP     Component Value Date/Time   NA 142 09/02/2015 0542   NA 142 07/29/2015 1119   K 3.8 09/02/2015 0542   CL 109 09/02/2015 0542   CO2 23 09/02/2015 0542   GLUCOSE 97 09/02/2015 0542   GLUCOSE 93 07/29/2015 1119   BUN 14 09/02/2015 0542   BUN 18 07/29/2015 1119   CREATININE 0.75 09/02/2015 0542   CALCIUM 9.2 09/02/2015 0542   PROT 6.2* 09/02/2015 0542   PROT 6.3 07/29/2015 1119   ALBUMIN 3.2* 09/02/2015 0542   ALBUMIN 4.2 07/29/2015 1119   AST 33 09/02/2015 0542   ALT 62* 09/02/2015 0542   ALKPHOS 90 09/02/2015 0542   BILITOT 1.1 09/02/2015 0542   BILITOT 0.8 07/29/2015 1119   GFRNONAA >60 09/02/2015 0542   GFRAA >60 09/02/2015 0542    GFR Estimated Creatinine Clearance: 46.7 mL/min (by C-G formula based on Cr of 0.75).  Recent Labs  08/31/15 0306  LIPASE 2667*    Recent Labs  08/31/15 0510 08/31/15 1210 08/31/15 1803  TROPONINI <0.03 <0.03 <0.03   Invalid input(s): POCBNP No results for input(s): DDIMER in the last 72 hours. No results for input(s): HGBA1C in the last 72 hours.  Recent Labs  08/31/15 0510  CHOL 207*  HDL 44  LDLCALC 147*  TRIG 81  CHOLHDL 4.7   No results for input(s): TSH, T4TOTAL, T3FREE, THYROIDAB in the last 72 hours.  Invalid input(s): FREET3 No results for input(s): VITAMINB12, FOLATE, FERRITIN, TIBC, IRON, RETICCTPCT in the last 72 hours. Coags: No results for input(s): INR in the last 72 hours.  Invalid input(s): PT Microbiology: Recent Results (from the past 240 hour(s))  C difficile quick scan w PCR reflex     Status: None   Collection Time: 09/01/15  1:15 AM  Result Value  Ref Range Status   C Diff antigen NEGATIVE NEGATIVE Final   C Diff toxin NEGATIVE NEGATIVE Final   C Diff interpretation Negative for toxigenic C. difficile  Final     BRIEF HOSPITAL COURSE:   Principal Problem: Acute pancreatitis: Etiology remains unknown, this is a second episode. Lipase was significantly elevated during this hospital stay. She was managed conservatively, diet was slowly advanced. By day of discharge able to tolerate a diet. MRCP did not demonstrate any major etiologies for pancreatitis. She does not have a history of alcohol use, she status post cholecystectomy-there is no choledocholithiasis evident on MRI. Triglycerides were within normal limits. No major medications were deemed to be culprit. Unfortunately this patient has dementia, and I doubt that further workup will change management this time. This was discussed with her son Zakeya Junker over the phone, we have agreed to see if patient can follow up with Colleen. Collene Huang to see if any further added workup is recommended at this setting. Note, EKG/cardiac enzymes was also negative. Please note,  the day of discharge, abdomen is soft, nontender, she is tolerating diet. Per RN, patient is ambulating in the room and in the hallway with her son.  Active Problems: Diarrhea: Self-limiting, C. difficile PCR negative. No further episodes on day of discharge.  Hypothyroidism: Continue Synthroid  Dementia: She is presently confused-unfortunately does not remember having either chest pain or abdominal pain on admission. She has mild but stable bradycardia-would continue Aricept for now. Could consider discontinuation in the future if bradycardia worsens.  TODAY-DAY OF DISCHARGE:  Subjective:   Sina Sumpter today has no headache,no chest abdominal pain,no new weakness tingling or numbness, feels much. She is very pleasantly confused, does not remember circumstances as to why she was actually hospitalized.  Objective:   Blood  pressure 160/133, pulse 59, temperature 97.7 F (36.5 C), temperature source Oral, resp. rate 16, height 5' 5"  (1.651 m), weight 69.6 kg (153 lb 7 oz), SpO2 100 %.  Intake/Output Summary (Last 24 hours) at 09/02/15 1333 Last data filed at 09/02/15 1008  Gross per 24 hour  Intake    360 ml  Output    900 ml  Net   -540 ml   Filed Weights   08/31/15 0201 09/01/15 0742 09/02/15 0511  Weight: 68.493 kg (151 lb) 68.72 kg (151 lb 8 oz) 69.6 kg (153 lb 7 oz)    Exam Awake,No new F.N deficits, Normal affect [place].AT,PERRAL Supple Neck,No JVD, No cervical lymphadenopathy appriciated.  Symmetrical Chest wall movement, Good air movement bilaterally, CTAB RRR,No Gallops,Rubs or new Murmurs, No Parasternal Heave +ve B.Sounds, Abd Soft, Non tender, No organomegaly appriciated, No rebound -guarding or rigidity. No Cyanosis, Clubbing or edema, No new Rash or bruise  DISCHARGE CONDITION: Stable  DISPOSITION: Home with home health services  DISCHARGE INSTRUCTIONS:    Activity:  As tolerated with Full fall precautions use walker/cane & assistance as needed  Get Medicines reviewed and adjusted: Please take all your medications with you for your next visit with your Primary MD  Please request your Primary MD to go over all hospital tests and procedure/radiological results at the follow up, please ask your Primary MD to get all Hospital records sent to his/her office.  If you experience worsening of your admission symptoms, develop shortness of breath, life threatening emergency, suicidal or homicidal thoughts you must seek medical attention immediately by calling 911 or calling your MD immediately  if symptoms less severe.  You must read complete instructions/literature along with all the possible adverse reactions/side effects for all the Medicines you take and that have been prescribed to you. Take any new Medicines after you have completely understood and accpet all the possible adverse  reactions/side effects.   Do not drive when taking Pain medications.   Do not take more than prescribed Pain, Sleep and Anxiety Medications  Special Instructions: If you have smoked or chewed Tobacco  in the last 2 yrs please stop smoking, stop any regular Alcohol  and or any Recreational drug use.  Wear Seat belts while driving.  Please note  You were cared for by a hospitalist during your hospital stay. Once you are discharged, your primary care physician will handle any further medical issues. Please note that NO REFILLS for any discharge medications will be authorized once you are discharged, as it is imperative that you return to your primary care physician (or establish a relationship with a primary care physician if you do not have one) for your aftercare needs so that they can reassess your need  for medications and monitor your lab values.   Diet recommendation: Low fat diet  Discharge Instructions    Call MD for:  persistant nausea and vomiting    Complete by:  As directed      Call MD for:  severe uncontrolled pain    Complete by:  As directed      Diet general    Complete by:  As directed   Low fat diet     Increase activity slowly    Complete by:  As directed            Follow-up Information    Follow up with OSEI-BONSU,GEORGE, MD. Schedule an appointment as soon as possible for a visit in 1 week.   Specialty:  Internal Medicine   Why:  Hospital follow up   Contact information:   [address] [place] [address] [phone]       Follow up with MANN,JYOTHI, MD. Schedule an appointment as soon as possible for a visit in 2 weeks.   Specialty:  Gastroenterology   Why:  Hospital follow up   Contact information:   [address], [place] [address] [phone]      Total Time spent on discharge equals  45 minutes.  SignedOren Binet 09/02/2015 1:33 PM

## 2015-09-03 ENCOUNTER — Ambulatory Visit: Payer: Medicare Other | Admitting: Neurology

## 2015-09-07 ENCOUNTER — Other Ambulatory Visit: Payer: Self-pay | Admitting: Gastroenterology

## 2015-09-07 DIAGNOSIS — K219 Gastro-esophageal reflux disease without esophagitis: Secondary | ICD-10-CM | POA: Diagnosis not present

## 2015-09-07 DIAGNOSIS — K8500 Idiopathic acute pancreatitis without necrosis or infection: Secondary | ICD-10-CM | POA: Diagnosis not present

## 2015-09-07 DIAGNOSIS — R079 Chest pain, unspecified: Secondary | ICD-10-CM | POA: Diagnosis not present

## 2015-09-08 ENCOUNTER — Telehealth: Payer: Self-pay | Admitting: Neurology

## 2015-09-08 NOTE — Telephone Encounter (Signed)
Called son back. Son has taken her off lorazepam. Had another pancreatic attack last Monday and had to go to hospital for 3 days. She is still having headaches every day still. He is not sure if it is her anxiety or something else. He is interested in trying to increase her medication if possible to see if this will help. Advised Dr Lucia GaskinsAhern seeing pt right now. I will call him back to advise by the end of the day. He verbalized understanding.

## 2015-09-08 NOTE — Telephone Encounter (Signed)
LVM for son to call back. Gave [facility] phone number.

## 2015-09-08 NOTE — Telephone Encounter (Signed)
We can increase her depakote to two pills at night as long as she is not having any side effects. Let me know what son says. Do we need to fax in a new prescription to the nursing home? thnaks

## 2015-09-08 NOTE — Telephone Encounter (Signed)
[name] is calling and states that Rx divalproex 250 mg is not working for his mother.  He is requesting to increase dosage from 250 mg to 500 mg.  Please call.

## 2015-09-08 NOTE — Telephone Encounter (Signed)
Dr Lucia GaskinsahernLorain Childes- FYI CAlled son back. Relayed Dr Lucia GaskinsAhern message. He denies any SE to medication. He states that he has enough to give her 2 at night for at least 2-3 weeks to see how to does. He will call back once he needs new rx called into pharmacy. Made earlier f/u on 09/29/15 instead of in May since they had to cx last week d/t her being in hospital.

## 2015-09-09 DIAGNOSIS — H409 Unspecified glaucoma: Secondary | ICD-10-CM | POA: Diagnosis not present

## 2015-09-09 DIAGNOSIS — R51 Headache: Secondary | ICD-10-CM | POA: Diagnosis not present

## 2015-09-09 DIAGNOSIS — F419 Anxiety disorder, unspecified: Secondary | ICD-10-CM | POA: Diagnosis not present

## 2015-09-09 DIAGNOSIS — E039 Hypothyroidism, unspecified: Secondary | ICD-10-CM | POA: Diagnosis not present

## 2015-09-09 DIAGNOSIS — E559 Vitamin D deficiency, unspecified: Secondary | ICD-10-CM | POA: Diagnosis not present

## 2015-09-09 DIAGNOSIS — E785 Hyperlipidemia, unspecified: Secondary | ICD-10-CM | POA: Diagnosis not present

## 2015-09-09 DIAGNOSIS — F0390 Unspecified dementia without behavioral disturbance: Secondary | ICD-10-CM | POA: Diagnosis not present

## 2015-09-09 DIAGNOSIS — I10 Essential (primary) hypertension: Secondary | ICD-10-CM | POA: Diagnosis not present

## 2015-09-09 DIAGNOSIS — E119 Type 2 diabetes mellitus without complications: Secondary | ICD-10-CM | POA: Diagnosis not present

## 2015-09-11 ENCOUNTER — Encounter (HOSPITAL_COMMUNITY): Payer: Self-pay | Admitting: *Deleted

## 2015-09-15 ENCOUNTER — Telehealth: Payer: Self-pay | Admitting: Neurology

## 2015-09-15 NOTE — Telephone Encounter (Signed)
[name] Reita ClicheBobby is calling and states that his mother has had diahhrea for about 2 weeks and is wondering if the Rx divalproex 250 mg 24 hr tablets are causing this.  Please call.

## 2015-09-15 NOTE — Telephone Encounter (Signed)
Go back to one tablet at night. Stop if the diarrhea comes back. thanks

## 2015-09-15 NOTE — Telephone Encounter (Signed)
Dr Lucia GaskinsAhern- Lorain ChildesFYI Called son back. Relayed Dr Lucia GaskinsAhern message below. He states pt having endoscopy on Friday. Per Dr Lucia GaskinsAhern, she wants to wait until after procedure to decide about restarting medication. Asked him to call us back and let us know how procedure goes/results. He verbalized understanding.

## 2015-09-15 NOTE — Telephone Encounter (Signed)
Called son back. He stated diarrhea started a couple weeks ago. Went and saw PCP and they gave her something in the office to bring home and take for diarrhea. This helped w/ sx. He had increased to 2 tablets at night, but only for one night. Not sure if this increased diarrhea or not. He stopped her divalproex sat or sun. He was not sure. He states diarrhea seems better being off medication. He is wondering if she should start medication again or what other options there are. Advised I will ask Dr Lucia GaskinsAhern and we will call back to advise. He verbalized understanding.

## 2015-09-18 ENCOUNTER — Ambulatory Visit (HOSPITAL_COMMUNITY): Payer: Medicare Other | Admitting: Anesthesiology

## 2015-09-18 ENCOUNTER — Ambulatory Visit (HOSPITAL_COMMUNITY)
Admission: RE | Admit: 2015-09-18 | Discharge: 2015-09-18 | Disposition: A | Discharge status: Home / Self Care | Payer: Medicare Other | Source: Ambulatory Visit | Attending: Gastroenterology | Admitting: Gastroenterology

## 2015-09-18 ENCOUNTER — Encounter (HOSPITAL_COMMUNITY)
Admission: RE | Disposition: A | Discharge status: Home / Self Care | Payer: Self-pay | Source: Ambulatory Visit | Attending: Gastroenterology

## 2015-09-18 ENCOUNTER — Encounter (HOSPITAL_COMMUNITY): Payer: Self-pay | Admitting: Anesthesiology

## 2015-09-18 DIAGNOSIS — Z9049 Acquired absence of other specified parts of digestive tract: Secondary | ICD-10-CM | POA: Diagnosis not present

## 2015-09-18 DIAGNOSIS — E119 Type 2 diabetes mellitus without complications: Secondary | ICD-10-CM | POA: Diagnosis not present

## 2015-09-18 DIAGNOSIS — K8500 Idiopathic acute pancreatitis without necrosis or infection: Secondary | ICD-10-CM | POA: Diagnosis not present

## 2015-09-18 DIAGNOSIS — R1013 Epigastric pain: Secondary | ICD-10-CM | POA: Diagnosis not present

## 2015-09-18 DIAGNOSIS — K859 Acute pancreatitis without necrosis or infection, unspecified: Secondary | ICD-10-CM | POA: Insufficient documentation

## 2015-09-18 DIAGNOSIS — I10 Essential (primary) hypertension: Secondary | ICD-10-CM | POA: Diagnosis not present

## 2015-09-18 DIAGNOSIS — D649 Anemia, unspecified: Secondary | ICD-10-CM | POA: Diagnosis not present

## 2015-09-18 DIAGNOSIS — E785 Hyperlipidemia, unspecified: Secondary | ICD-10-CM | POA: Insufficient documentation

## 2015-09-18 DIAGNOSIS — F0390 Unspecified dementia without behavioral disturbance: Secondary | ICD-10-CM | POA: Diagnosis not present

## 2015-09-18 DIAGNOSIS — E039 Hypothyroidism, unspecified: Secondary | ICD-10-CM | POA: Insufficient documentation

## 2015-09-18 DIAGNOSIS — H409 Unspecified glaucoma: Secondary | ICD-10-CM | POA: Diagnosis not present

## 2015-09-18 HISTORY — DX: Headache: R51

## 2015-09-18 HISTORY — PX: EUS: SHX5427

## 2015-09-18 HISTORY — DX: Unspecified hearing loss, unspecified ear: H91.90

## 2015-09-18 HISTORY — DX: Other amnesia: R41.3

## 2015-09-18 HISTORY — DX: Headache, unspecified: R51.9

## 2015-09-18 HISTORY — DX: Anxiety disorder, unspecified: F41.9

## 2015-09-18 SURGERY — UPPER ENDOSCOPIC ULTRASOUND (EUS) LINEAR
Anesthesia: Monitor Anesthesia Care

## 2015-09-18 MED ORDER — LACTATED RINGERS IV SOLN
INTRAVENOUS | Status: DC
  Administered 2015-09-18: 1000 mL via INTRAVENOUS

## 2015-09-18 MED ORDER — PROPOFOL 10 MG/ML IV BOLUS
INTRAVENOUS | Status: AC
  Filled 2015-09-18: qty 40

## 2015-09-18 MED ORDER — SODIUM CHLORIDE 0.9 % IV SOLN
INTRAVENOUS | Status: DC
  Administered 2015-09-18: 12:00:00 via INTRAVENOUS

## 2015-09-18 MED ORDER — PROPOFOL 500 MG/50ML IV EMUL
INTRAVENOUS | Status: DC | PRN
  Administered 2015-09-18: 250 ug/kg/min via INTRAVENOUS

## 2015-09-18 MED ORDER — LIDOCAINE HCL (CARDIAC) 20 MG/ML IV SOLN
INTRAVENOUS | Status: DC | PRN
  Administered 2015-09-18: 75 mg via INTRAVENOUS

## 2015-09-18 MED ORDER — ONDANSETRON HCL 4 MG/2ML IJ SOLN: 4.0000 mg | Freq: Once | INTRAMUSCULAR | Status: DC | PRN

## 2015-09-18 MED ORDER — LIDOCAINE HCL (CARDIAC) 20 MG/ML IV SOLN
INTRAVENOUS | Status: AC
  Filled 2015-09-18: qty 5

## 2015-09-18 MED ORDER — GLYCOPYRROLATE 0.2 MG/ML IJ SOLN
INTRAMUSCULAR | Status: AC
  Filled 2015-09-18: qty 1

## 2015-09-18 MED ORDER — GLYCOPYRROLATE 0.2 MG/ML IJ SOLN
INTRAMUSCULAR | Status: DC | PRN
  Administered 2015-09-18: 0.2 mg via INTRAVENOUS

## 2015-09-18 NOTE — Anesthesia Postprocedure Evaluation (Signed)
Anesthesia Post Note  Patient: Colleen KaufmanMarion S Huang  Procedure(s) Performed: Procedure(s) (LRB): UPPER ENDOSCOPIC ULTRASOUND (EUS) LINEAR (N/A)  Patient location during evaluation: PACU Anesthesia Type: MAC Level of consciousness: awake and alert Pain management: pain level controlled Vital Signs Assessment: post-procedure vital signs reviewed and stable Respiratory status: spontaneous breathing, nonlabored ventilation and respiratory function stable Cardiovascular status: blood pressure returned to baseline and stable Postop Assessment: no signs of nausea or vomiting Anesthetic complications: no    Last Vitals:  Filed Vitals:   09/18/15 1310 09/18/15 1320  BP: 96/65 134/55  Pulse: 59 64  Temp:    Resp: 20 14    Last Pain:  Filed Vitals:   09/18/15 1328  PainSc: 0-No pain                 Takara Sermons A.

## 2015-09-18 NOTE — Anesthesia Preprocedure Evaluation (Addendum)
Anesthesia Evaluation  Patient identified by MRN, date of birth, ID band Patient awake    Reviewed: Allergy & Precautions, NPO status , Patient's Chart, lab work & pertinent test results  Airway Mallampati: II  TM Distance: >3 FB Neck ROM: Full    Dental  (+) Partial Upper   Pulmonary neg pulmonary ROS,    Pulmonary exam normal breath sounds clear to auscultation       Cardiovascular hypertension, Normal cardiovascular exam Rhythm:Regular Rate:Normal     Neuro/Psych  Headaches, PSYCHIATRIC DISORDERS Anxiety DementiaHearing Loss Glaucoma    GI/Hepatic Neg liver ROS, Pancreatitis Abdominal pain   Endo/Other  diabetes, Well Controlled, Type 2Hypothyroidism   Renal/GU negative Renal ROS  negative genitourinary   Musculoskeletal  (+) Arthritis ,   Abdominal   Peds  Hematology  (+) anemia ,   Anesthesia Other Findings   Reproductive/Obstetrics                           Anesthesia Physical Anesthesia Plan  ASA: III  Anesthesia Plan: MAC   Post-op Pain Management:    Induction: Intravenous  Airway Management Planned: Natural Airway  Additional Equipment:   Intra-op Plan:   Post-operative Plan:   Informed Consent: I have reviewed the patients History and Physical, chart, labs and discussed the procedure including the risks, benefits and alternatives for the proposed anesthesia with the patient or authorized representative who has indicated his/her understanding and acceptance.     Plan Discussed with: CRNA, Anesthesiologist and Surgeon  Anesthesia Plan Comments:         Anesthesia Quick Evaluation

## 2015-09-18 NOTE — Transfer of Care (Signed)
Immediate Anesthesia Transfer of Care Note  Patient: Colleen KaufmanMarion S Deeb  Procedure(s) Performed: Procedure(s): UPPER ENDOSCOPIC ULTRASOUND (EUS) LINEAR (N/A)  Patient Location: PACU  Anesthesia Type:MAC  Level of Consciousness: sedated and patient cooperative  Airway & Oxygen Therapy: Patient Spontanous Breathing and Patient connected to nasal cannula oxygen  Post-op Assessment: Report given to RN, Post -op Vital signs reviewed and stable and Patient moving all extremities X 4  Post vital signs: stable  Last Vitals:  Filed Vitals:   09/18/15 1203  BP: 136/48  Pulse: 54  Temp: 36.4 C  Resp: 14    Complications: No apparent anesthesia complications

## 2015-09-18 NOTE — Op Note (Signed)
[facility] Patient Name: Colleen Huang Procedure Date: 09/18/2015 MRN: [id number] Attending MD: Jeani Hawking , MD Date of Birth: [birth year] CSN:  Age: 82 Admit Type: Inpatient Procedure:                Upper EUS Indications:              Acute recurrent pancreatitis Providers:                Jeani Hawking, MD Referring MD:              Medicines:                Propofol per Anesthesia Complications:            No immediate complications. Estimated Blood Loss:     Estimated blood loss: none. Procedure:                Pre-Anesthesia Assessment:                           - Prior to the procedure, a History and Physical                            was performed, and patient medications and                            allergies were reviewed. The patient's tolerance of                            previous anesthesia was also reviewed. The risks                            and benefits of the procedure and the sedation                            options and risks were discussed with the patient.                            All questions were answered, and informed consent                            was obtained. Prior Anticoagulants: The patient has                            taken no previous anticoagulant or antiplatelet                            agents. ASA Grade Assessment: II - A patient with                            mild systemic disease. After reviewing the risks                            and benefits, the patient was deemed in  satisfactory condition to undergo the procedure.                           After obtaining informed consent, the endoscope was                            passed under direct vision. Throughout the                            procedure, the patient's blood pressure, pulse, and                            oxygen saturations were monitored continuously. The                            [id number] ([id number] scope was  introduced through                            the mouth, and advanced to the second part of                            duodenum. The upper EUS was accomplished without                            difficulty. The patient tolerated the procedure                            well. Findings:      Endosonographic Finding :      Evidence of a previous cholecystectomy was identified       endosonographically. The biliary tract was normal in caliber. Distally       the CBD measured 4.1 mm. No evidence of any stones or sludge.      Endosonographic Finding :      There was no sign of significant endosonographic abnormality in the       pancreatic head, in the pancreatic body, in the pancreatic tail and in       the pancreatic neck. The pancreatic duct measured up to 2 mm in       diameter. No pathologic lymphadenopathy, no masses, no cysts, no       calcifications, the pancreatic duct was well visualized from ampulla to       tail, the pancreatic duct was thin in caliber, the pancreatic duct was       regular in contour.      Endosonographic Finding :      There was no sign of significant endosonographic abnormality in the left       lobe of the liver. Impression:               - Evidence of a cholecystectomy.                           - There was no sign of significant pathology in the                            pancreatic head, in the pancreatic body, in the  pancreatic tail and in the pancreatic neck.                           - There was no evidence of significant pathology in                            the left lobe of the liver.                           - No specimens collected. Moderate Sedation:      None Recommendation:           - Patient has a contact number available for                            emergencies. The signs and symptoms of potential                            delayed complications were discussed with the                            patient.  Return to normal activities tomorrow.                            Written discharge instructions were provided to the                            patient.                           - Resume previous diet. Procedure Code(s):        --- Professional ---                           [phone], Esophagogastroduodenoscopy, flexible,                            transoral; with endoscopic ultrasound examination                            limited to the esophagus, stomach or duodenum, and                            adjacent structures Diagnosis Code(s):        --- Professional ---                           K85.90, Acute pancreatitis without necrosis or                            infection, unspecified                           Z90.49, Acquired absence of other specified parts                            of digestive tract CPT copyright  2016 [employer]. All rights reserved. The codes documented in this report are preliminary and upon coder review may  be revised to meet current compliance requirements. Jeani Hawking, MD Jeani Hawking, MD 09/18/2015 12:56:41 PM This report has been signed electronically. Number of Addenda: 0

## 2015-09-18 NOTE — H&P (View-Only) (Signed)
[facility] PROGRESS NOTE  Colleen Huang HEN:[id number] DOB: 1923/12/29 DOA: 08/31/2015 PCP: Benito Mccreedy, MD  HPI: 82 year old female with a history of hypertension presently off medications, hyperlipidemia, hypothyroidism and dementia who was diagnosed with pancreatitis in December 2016 (was not clear at the time), presents to the ER because of chest pain which happened suddenly last night. Lasted for half an hour retrosternal, pressure-like, non-radiating with no associated vomiting. Notes diarrhea over the last few days. In the ED, EKG and chest x-ray were unremarkable. Troponin was negative. Lipase was around 2000. Pt admitted for management of acute pancreatitis - likely the source of chest pain. During patient's last admission gastroenterologist recommended if any further episodes to have MRCP done.  MRI with MRCP showed no evidence of acute pancreatitis at this time.  Subjective: Denies any complaint of chest pain, nausea, or vomiting at this time. She does report mild suprapubic pain and diarrhea over the last several days.  Assessment/Plan:  Questionable pancreatitis  - admitted for same in December of 2016, presented with chest pain as well as epigastric pain at that time with elevated lipase. Gastroenterologist at that time recommended MRCP scan if pt experienced any further episodes - presented today with chest pain and elevated lipase ~2000, mildly elevated LFTs - MRI abdomen, including MRCP showed no evidence of acute pancreatitis, questioning the significance of lipase elevation. - pt received IVF and tolerated clear liquids without complaint, will advance to soft diet at this time and continue to monitor - LFTs are still mildly elevated, will repeat metabolic panel in the AM - if tolerates diet can consider D/C in AM  Chest pain - resolved, initially thought to be due to acute pancreatitis as this pain was similar to prior episode in December 2016 - initial  EKG, chest x-ray, troponin x 3 negative - place on continuous cardiac monitoring.  Bradycardia -Patient has sinus bradycardia, she is on telemetry. Completely asymptomatic. -No AV nodal blocking medication but is on our setup. -If continues to be bradycardic with light need to hold Aricept.  Diarrhea - likely not of infectious etiology, C. Diff negative - started on imodium and florastor  Anemia - Hgb of 10.9 today, appears dilutional, baseline Hgb of ~12.0  Hypothyroidism - continue synthroid  Hypertension - currently off medications, BP controlled - as needed IV hydralazine   Dementia - continue Aricept and Depakote   Code Status: Do not recucitate Family Communication: Son Disposition Plan: Remain inpatient   Consultants:  None  Procedures:  None  Antibiotics:  None   Objective: Filed Vitals:   08/31/15 2108 09/01/15 0552  BP: 110/44 141/50  Pulse: 51 94  Temp: 98.7 F (37.1 C) 97.6 F (36.4 C)  Resp: 20 18    Intake/Output Summary (Last 24 hours) at 09/01/15 1020 Last data filed at 09/01/15 0954  Gross per 24 hour  Intake 1136.67 ml  Output    815 ml  Net 321.67 ml   Filed Weights   08/31/15 0201 09/01/15 0742  Weight: 68.493 kg (151 lb) 68.72 kg (151 lb 8 oz)    Exam:   General: Alert and oriented, in no acute distress  Cardiovascular: RRR  Respiratory: CTAB  Abdomen: Soft, non-distended, mild suprapubic tenderness to palpation  Musculoskeletal: No edema  Data Reviewed: Basic Metabolic Panel:  Recent Labs Lab 08/31/15 0306 09/01/15 0420  NA 141 141  K 3.7 4.3  CL 105 109  CO2 22 24  GLUCOSE 87 101*  BUN 27* 17  CREATININE 0.87  0.78  CALCIUM 9.4 8.6*   Liver Function Tests:  Recent Labs Lab 08/31/15 0306 09/01/15 0420  AST 106* 64*  ALT 55* 83*  ALKPHOS 84 90  BILITOT 1.9* 1.5*  PROT 6.3* 5.7*  ALBUMIN 3.5 3.1*    Recent Labs Lab 08/31/15 0306  LIPASE 2667*   No results for input(s): AMMONIA in the  last 168 hours. CBC:  Recent Labs Lab 08/31/15 0306 09/01/15 0420  WBC 9.1 4.8  HGB 12.1 10.9*  HCT 37.2 34.0*  MCV 87.7 87.9  PLT 208 199   Cardiac Enzymes:  Recent Labs Lab 08/31/15 0510 08/31/15 1210 08/31/15 1803  TROPONINI <0.03 <0.03 <0.03   BNP (last 3 results) No results for input(s): BNP in the last 8760 hours.  ProBNP (last 3 results) No results for input(s): PROBNP in the last 8760 hours.  CBG:  Recent Labs Lab 08/31/15 0952 08/31/15 1214 08/31/15 1606 08/31/15 2009 09/01/15 0814  GLUCAP 97 95 177* 110* 86    Recent Results (from the past 240 hour(s))  C difficile quick scan w PCR reflex     Status: None   Collection Time: 09/01/15  1:15 AM  Result Value Ref Range Status   C Diff antigen NEGATIVE NEGATIVE Final   C Diff toxin NEGATIVE NEGATIVE Final   C Diff interpretation Negative for toxigenic C. difficile  Final     Studies: Dg Chest 2 View  08/31/2015  CLINICAL DATA:  Central chest pain, onset tonight. EXAM: CHEST  2 VIEW COMPARISON:  05/18/2015 FINDINGS: There is moderate cardiomegaly, unchanged. The lungs are clear. There is no pleural effusion. Hilar and mediastinal contours are unremarkable unchanged. IMPRESSION: Cardiomegaly.  No acute cardiopulmonary findings. Electronically Signed   By: [name] M.D.   On: 08/31/2015 02:32   Mr 3d Recon At Scanner  08/31/2015  CLINICAL DATA:  hypertension presently off medications, hyperlipidemia, hypothyroidism and dementia who was diagnosed with pancreatitis in December 2016. Presents to the ER because of chest pain which happened suddenly. Concern for acute pancreatitis with elevated lipase an acute chest pain. EXAM: MRI ABDOMEN WITHOUT AND WITH CONTRAST (INCLUDING MRCP) TECHNIQUE: Multiplanar multisequence MR imaging of the abdomen was performed both before and after the administration of intravenous contrast. Heavily T2-weighted images of the biliary and pancreatic ducts were obtained, and  three-dimensional MRCP images were rendered by post processing. CONTRAST:  13m MULTIHANCE GADOBENATE DIMEGLUMINE 529 MG/ML IV SOLN COMPARISON:  CT 05/14/2015 FINDINGS: Lower chest: Mild basilar atelectasis. No pleural fluid. No pericardial fluid. Hepatobiliary: No intrahepatic biliary duct dilatation. The common hepatic duct and the common bile duct are dilated to 9 mm which is similar to CT of 05/14/2015. Patient status post cholecystectomy . There is no obstructing lesion identified in the course of the common bile duct. No filling defect. Pancreas: There is no significant peripancreatic edema or inflammation identified. No peripancreatic fluid collections. The pancreatic duct is normal caliber. The distal pancreatic duct is slightly ectatic but appears to join the common bile duct in typical ductal anatomy. Small peri Pancreatic lymph nodes which are not pathologic by size criteria met measuring less than 8 mm short axis. The pancreas enhances uniformly without evidence of lesion. Spleen: Normal spleen. Adrenals/urinary tract: Adrenal glands and kidneys are normal. Stomach/Bowel: Stomach and limited of the small bowel is unremarkable Vascular/Lymphatic: Abdominal aortic normal caliber. No retroperitoneal periportal lymphadenopathy. Musculoskeletal: No aggressive osseous lesion IMPRESSION: 1. No evidence of acute pancreatitis by MRI imaging. No peripancreatic fluid collections. The pancreatic duct is  normal. 2. Dilatation of the common hepatic duct and common bile duct similar to comparison CT likely a combination of age and prior cholecystectomy. No filling defect in the common bile duct. 3. No hepatic lesion. Electronically Signed   By: [name] M.D.   On: 08/31/2015 10:11   Mr Jeananne Rama W/wo Cm/mrcp  08/31/2015  CLINICAL DATA:  hypertension presently off medications, hyperlipidemia, hypothyroidism and dementia who was diagnosed with pancreatitis in December 2016. Presents to the ER because of chest pain  which happened suddenly. Concern for acute pancreatitis with elevated lipase an acute chest pain. EXAM: MRI ABDOMEN WITHOUT AND WITH CONTRAST (INCLUDING MRCP) TECHNIQUE: Multiplanar multisequence MR imaging of the abdomen was performed both before and after the administration of intravenous contrast. Heavily T2-weighted images of the biliary and pancreatic ducts were obtained, and three-dimensional MRCP images were rendered by post processing. CONTRAST:  84m MULTIHANCE GADOBENATE DIMEGLUMINE 529 MG/ML IV SOLN COMPARISON:  CT 05/14/2015 FINDINGS: Lower chest: Mild basilar atelectasis. No pleural fluid. No pericardial fluid. Hepatobiliary: No intrahepatic biliary duct dilatation. The common hepatic duct and the common bile duct are dilated to 9 mm which is similar to CT of 05/14/2015. Patient status post cholecystectomy . There is no obstructing lesion identified in the course of the common bile duct. No filling defect. Pancreas: There is no significant peripancreatic edema or inflammation identified. No peripancreatic fluid collections. The pancreatic duct is normal caliber. The distal pancreatic duct is slightly ectatic but appears to join the common bile duct in typical ductal anatomy. Small peri Pancreatic lymph nodes which are not pathologic by size criteria met measuring less than 8 mm short axis. The pancreas enhances uniformly without evidence of lesion. Spleen: Normal spleen. Adrenals/urinary tract: Adrenal glands and kidneys are normal. Stomach/Bowel: Stomach and limited of the small bowel is unremarkable Vascular/Lymphatic: Abdominal aortic normal caliber. No retroperitoneal periportal lymphadenopathy. Musculoskeletal: No aggressive osseous lesion IMPRESSION: 1. No evidence of acute pancreatitis by MRI imaging. No peripancreatic fluid collections. The pancreatic duct is normal. 2. Dilatation of the common hepatic duct and common bile duct similar to comparison CT likely a combination of age and prior  cholecystectomy. No filling defect in the common bile duct. 3. No hepatic lesion. Electronically Signed   By: SSuzy BouchardM.D.   On: 08/31/2015 10:11    Scheduled Meds: . divalproex  250 mg Oral Daily  . donepezil  10 mg Oral QHS  . etodolac  500 mg Oral BID  . latanoprost  1 drop Both Eyes QHS  . levothyroxine  68.5 mcg Intravenous Daily  . timolol  1 drop Both Eyes Daily   Continuous Infusions:   Principal Problem:   Acute pancreatitis Active Problems:   Dyslipidemia   Benign essential HTN   Other specified hypothyroidism   Chest pain    Time spent: 3Moran PA-S [facility] Pager [phone] If 7PM-7AM, please contact night-coverage at [email or web address], password [facility]3/21/2017, 10:20 AM  LOS: 1 day      MBirdie HopesPager: [phone]/21/2017, 3:25 PM

## 2015-09-18 NOTE — Interval H&P Note (Signed)
History and Physical Interval Note:  09/18/2015 8:02 AM  Colleen Huang  has presented today for surgery, with the diagnosis of pancreatitis  The various methods of treatment have been discussed with the patient and family. After consideration of risks, benefits and other options for treatment, the patient has consented to  Procedure(s): UPPER ENDOSCOPIC ULTRASOUND (EUS) LINEAR (N/A) as a surgical intervention .  The patient's history has been reviewed, patient examined, no change in status, stable for surgery.  I have reviewed the patient's chart and labs.  Questions were answered to the patient's satisfaction.    The patient was recentloy hospitalized for idiopathic pancreatitis. This was her second episode and her lipase aws in the 2000 range. She is s/p cholecystectomy and the MRCP on 08/31/3015 was negative for any retained CBD stones. Her CBD was noted to be at 9 mm, which was unchanged. No evidence of pancreatic divism. During this last admission she complained of chest pain.  Taiwo Fish D

## 2015-09-18 NOTE — Discharge Instructions (Signed)
Gastrointestinal Endoscopy, Care After °Refer to this sheet in the next few weeks. These instructions provide you with information on caring for yourself after your procedure. Your caregiver may also give you more specific instructions. Your treatment has been planned according to current medical practices, but problems sometimes occur. Call your caregiver if you have any problems or questions after your procedure. °HOME CARE INSTRUCTIONS °· If you were given medicine to help you relax (sedative), do not drive, operate machinery, or sign important documents for 24 hours. °· Avoid alcohol and hot or warm beverages for the first 24 hours after the procedure. °· Only take over-the-counter or prescription medicines for pain, discomfort, or fever as directed by your caregiver. You may resume taking your normal medicines unless your caregiver tells you otherwise. Ask your caregiver when you may resume taking medicines that may cause bleeding, such as aspirin, clopidogrel, or warfarin. °· You may return to your normal diet and activities on the day after your procedure, or as directed by your caregiver. Walking may help to reduce any bloated feeling in your abdomen. °· Drink enough fluids to keep your urine clear or pale yellow. °· You may gargle with salt water if you have a sore throat. °SEEK IMMEDIATE MEDICAL CARE IF: °· You have severe nausea or vomiting. °· You have severe abdominal pain, abdominal cramps that last longer than 6 hours, or abdominal swelling (distention). °· You have severe shoulder or back pain. °· You have trouble swallowing. °· You have shortness of breath, your breathing is shallow, or you are breathing faster than normal. °· You have a fever or a rapid heartbeat. °· You vomit blood or material that looks like coffee grounds. °· You have bloody, black, or tarry stools. °MAKE SURE YOU: °· Understand these instructions. °· Will watch your condition. °· Will get help right away if you are not doing  well or get worse. °  °This information is not intended to replace advice given to you by your health care provider. Make sure you discuss any questions you have with your health care provider. °  °Document Released: 01/12/2004 Document Revised: 06/20/2014 Document Reviewed: 08/30/2011 °Elsevier Interactive Patient Education ©2016 [employer] ° °

## 2015-09-22 ENCOUNTER — Encounter (HOSPITAL_COMMUNITY): Payer: Self-pay | Admitting: Gastroenterology

## 2015-09-22 DIAGNOSIS — E119 Type 2 diabetes mellitus without complications: Secondary | ICD-10-CM | POA: Diagnosis not present

## 2015-09-22 DIAGNOSIS — Z01118 Encounter for examination of ears and hearing with other abnormal findings: Secondary | ICD-10-CM | POA: Diagnosis not present

## 2015-09-22 DIAGNOSIS — E559 Vitamin D deficiency, unspecified: Secondary | ICD-10-CM | POA: Diagnosis not present

## 2015-09-22 DIAGNOSIS — H538 Other visual disturbances: Secondary | ICD-10-CM | POA: Diagnosis not present

## 2015-09-22 DIAGNOSIS — Z0000 Encounter for general adult medical examination without abnormal findings: Secondary | ICD-10-CM | POA: Diagnosis not present

## 2015-09-22 DIAGNOSIS — F419 Anxiety disorder, unspecified: Secondary | ICD-10-CM | POA: Diagnosis not present

## 2015-09-22 DIAGNOSIS — I10 Essential (primary) hypertension: Secondary | ICD-10-CM | POA: Diagnosis not present

## 2015-09-22 DIAGNOSIS — E785 Hyperlipidemia, unspecified: Secondary | ICD-10-CM | POA: Diagnosis not present

## 2015-09-22 DIAGNOSIS — H409 Unspecified glaucoma: Secondary | ICD-10-CM | POA: Diagnosis not present

## 2015-09-22 DIAGNOSIS — E039 Hypothyroidism, unspecified: Secondary | ICD-10-CM | POA: Diagnosis not present

## 2015-09-29 ENCOUNTER — Ambulatory Visit (INDEPENDENT_AMBULATORY_CARE_PROVIDER_SITE_OTHER): Payer: Medicare Other | Admitting: Neurology

## 2015-09-29 ENCOUNTER — Encounter: Payer: Self-pay | Admitting: Neurology

## 2015-09-29 VITALS — BP 164/77 | HR 53 | Ht 65.0 in | Wt 143.0 lb

## 2015-09-29 DIAGNOSIS — G4489 Other headache syndrome: Secondary | ICD-10-CM

## 2015-09-29 DIAGNOSIS — F03918 Unspecified dementia, unspecified severity, with other behavioral disturbance: Secondary | ICD-10-CM

## 2015-09-29 DIAGNOSIS — F0391 Unspecified dementia with behavioral disturbance: Secondary | ICD-10-CM | POA: Diagnosis not present

## 2015-09-29 MED ORDER — LEVOTHYROXINE SODIUM 50 MCG PO TABS: 137.0000 ug | ORAL_TABLET | Freq: Every day | ORAL | Status: DC

## 2015-09-29 NOTE — Progress Notes (Signed)
[facility]    Provider:  Dr Jaynee Eagles Referring Provider: Benito Mccreedy, MD Primary Care Physician:  Benito Mccreedy, MD  [facility]    Provider: Dr Jaynee Eagles Referring Provider: Benito Mccreedy, MD Primary Care Physician: Benito Mccreedy, MD  CC: Memory problems  Interval history 09/29/2015: She had a "melt-down" last night because she got upset about having poison ivy. She says that she has a headache right now but appears comfortable. She says her "ears are filling up with fluid" and that is how the headaches feel every time. The ears feel full of fluid. Son believes this is due to anxiety. The headaches have been fine until she got upset last night. She lives in [place]. Lives with her son who cares for her. They decline ENT referral at this time. If patient can be redirected when she has a headache, I suggest starting any new medications. Son does feel like she can be redirected and he has recently moved in with her is making things better. Patient seems fine today, she is very interactive and talkative. Things appear to be going well. Son is comfortable with keeping her off any new medication. Discussed that we can continue on the Aricept at this time, memory appears stable. Follow-up 6 months.  Interval history 07/29/2015: Has a new problem today a headache. Sons provide most information. She has Her ears fill up with fluid and it goes up to the top of the head. Head feels full. She is having them every day. If she gets upset she gets them. She usually doesn't wake up with them but the sons report different. No snoring. She has been having them for a few months. The symptoms are worsening and more often. She was started on etodolac as needed for the headache. Lorazepam was started. No vision changes, no temple pain, no jaw claudication. She had an episode of being angry. No light sensitivity. No sound sensitivity. No nausea or vomiting.  She has anxiety and then that leads to a headache. She has an episode of anger. She did not have them before the pancreatitis. She says she feels like a child and had always been independent and now she i streated as a child and this may be a part of her headaches. No fevres or systemic sugns, no abd pain, no CP. Reviewed labs and CT of the head with family.   Colleen Huang 1/5/2015[name] 82 year old female returns for follow-up. She was initially evaluated for memory loss by Dr. Jaynee Eagles 01/06/2015. She returns today for follow-up but also for acute headache for several days. Her family states she has never had headaches before. The headache is located over the ears she feels a fullness in her ears,she denies any visual disturbance although does have blurred vision, no difficulty chewing. Some dizziness is noted however she has not had any falls or difficulty walking.No jaw claudication. She was given some Xanax by her primary care previously which has been given to the patient without much benefit although it has decreased her anxiety. She has also taken some ibuprofen but this has not been beneficial. She returns in regards to this headache.   HPI: Colleen Huang is a 82 y.o. female here as a referral from Dr. Vista Lawman for memory problems. PMHx HTN, DM, HLD.   Here with daughter-in-law. Memory issues for many years, worsening in the last 5 years. Patient keeps a calendar and writes things down. She checks her calendar every day. She cant remember what happened yesterday,  gets confused. More short-term memory loss. She can remember things that happened a long time ago. She is forgetting her grandchildren's birthday more recently. She lives at home alone. She left the oven on one time and burnt brownies. She pays her own bills, she is up to date on her bills. She knows the bills she needs to pay. She is fine day to day, cooks her own meals, takes care of herself. She drives short  distances to familiar places like church and the grocery store. She doesn't like to drive at night. She is stubborn and independent. Likes to be independent. She has lost a lot of friends and that is upsetting but no mood problems or depression. No hallucinations or delusions. Not missing any medications, manages herself. She keeps her own house. No confusional episodes. She repeats things a lot. No alterations of consciousness. Brother with Alzheimer's. Patient says every once in a while she is confused. Patient says she didn't want to come here today.   Review of Systems: Patient complains of symptoms per HPI as well as the following symptoms: weight gain, memory loss, confusion, headache. Pertinent negatives per HPI. All others negative.    Social History   Social History  . Marital Status: Widowed    Spouse Name: N/A  . Number of Children: 4  . Years of Education: 15   Occupational History  . Not on file.   Social History Main Topics  . Smoking status: Never Smoker   . Smokeless tobacco: Not on file  . Alcohol Use: No  . Drug Use: No  . Sexual Activity: Not on file   Other Topics Concern  . Not on file   Social History Narrative   Lives at home by herself.   Caffeine use: 1-2 cups hot tea/day.     Family History  Problem Relation Age of Onset  . Dementia Brother     Past Medical History  Diagnosis Date  . Pancreatitis 04/2015    recent hospital visit to [facility] 08-31-15.  . Memory difficulties     "repeats self alot"- has increasing levels of memory issues  . Hearing loss     wears hearing aids  . Diabetes mellitus without complication ([place])     past history in Epic note from Neurlogy visit  . Headache     recent neurology visit to evaluate headaches.  . Anxiety     anxiety with episodes of angry outbursts" feels as being treated as a child"    Past Surgical History  Procedure Laterality Date  . Appendectomy  1935  . Tonsillectomy  1949  .  Parathyroidectomy / exploration of parathyroids  2003  . Gallbladder surgery  1997  . Colonoscopy  2005, 2009  . Flexible sigmoidoscopy  2005  . Rectal polypectomy  2005  . Cholecystectomy    . Eus N/A 09/18/2015    Procedure: UPPER ENDOSCOPIC ULTRASOUND (EUS) LINEAR;  Surgeon: Carol Ada, MD;  Location: [facility];  Service: Endoscopy;  Laterality: N/A;    Current Outpatient Prescriptions  Medication Sig Dispense Refill  . Calcium Carb-Cholecalciferol (CALCIUM 600 + D PO) Take 1 tablet by mouth 2 (two) times daily.     [name] donepezil (ARICEPT) 10 MG tablet Take 1 tablet (10 mg total) by mouth at bedtime. 30 tablet 11  . latanoprost (XALATAN) 0.005 % ophthalmic solution Place 1 drop into both eyes at bedtime.     [name] levothyroxine (SYNTHROID, LEVOTHROID) 137 MCG tablet Take 137 mcg by  mouth daily before breakfast.    . Omega-3 Fatty Acids (FISH OIL PO) Take 1 capsule by mouth 2 (two) times daily.    . Pancrelipase, Lip-Prot-Amyl, (ZENPEP PO) Take 2 capsules by mouth 3 (three) times daily.    . timolol (TIMOPTIC-XR) 0.5 % ophthalmic gel-forming Place 1 drop into both eyes every morning.    . divalproex (DEPAKOTE ER) 250 MG 24 hr tablet Take 1 tablet (250 mg total) by mouth daily. (Patient not taking: Reported on 09/29/2015) 30 tablet 12  . etodolac (LODINE) 500 MG tablet Take 500 mg by mouth 2 (two) times daily as needed (pain). Reported on 09/29/2015  0  . pantoprazole (PROTONIX) 40 MG tablet Take 1 tablet (40 mg total) by mouth daily. (Patient not taking: Reported on 09/29/2015) 30 tablet 0   No current facility-administered medications for this visit.    Allergies as of 09/29/2015 - Review Complete 09/29/2015  Allergen Reaction Noted  . Codeine  05/08/2013  . Hydrocodone  05/08/2013  . Penicillins Itching, Swelling, and Rash 05/08/2013  . Percocet [oxycodone-acetaminophen]  05/08/2013  . Sulfa antibiotics  05/08/2013    Vitals: BP 164/77 mmHg  Pulse 53  Ht _0  (1.651 m)  Wt 143  lb (64.864 kg)  BMI 23.80 kg/m2 Last Weight:  Wt Readings from Last 1 Encounters:  09/29/15 143 lb (64.864 kg)   Last Height:   Ht Readings from Last 1 Encounters:  09/29/15 _1  (1.651 m)   Montreal Cognitive Assessment  06/18/2015 01/06/2015  Visuospatial/ Executive (0/5) 4 4  Naming (0/3) 3 2  Attention: Read list of digits (0/2) 2 2  Attention: Read list of letters (0/1) 1 1  Attention: Serial 7 subtraction starting at 100 (0/3) 3 3  Language: Repeat phrase (0/2) 2 1  Language : Fluency (0/1) 1 1  Abstraction (0/2) 2 0  Delayed Recall (0/5) 0 0  Orientation (0/6) 5 5  Total 23 19  Adjusted Score (based on education) - 19       Speech:  Speech is normal; fluent and spontaneous with normal comprehension.  Cognition: MoCA 19/30 (-1 visuosptaial execution, -1 naming, -1 language, -2 abstraction, -5 delayed recall, -1 orientation)  The patient is oriented to person, place, month, year;   recent memory impaired and remote memory intact;   language fluent;   normal attention, concentration,   fund of knowledge appears grossly intact Cranial Nerves:  The pupils are equal, round, and reactive to light. Attempted fundi, could not visualize. Visual fields are full to finger confrontation. Extraocular movements are intact. Trigeminal sensation is intact and the muscles of mastication are normal. The face is symmetric. The palate elevates in the midline. Hearing intact. Voice is normal. Shoulder shrug is normal. The tongue has normal motion without fasciculations.   Coordination:  No dysmetria   Gait:  Not ataxic  Motor Observation:  No asymmetry, no atrophy, and no involuntary movements noted. Tone:  Normal muscle tone.   Posture:  Posture is normal. normal erect   Strength:  Strength is V/V in the upper and lower limbs.    Sensation: intact to LT   Reflex Exam:  DTR's:  Deep tendon reflexes in the upper and lower  extremities are symmetrical bilaterally.  Toes:  The toes are downgoing bilaterally.  Clonus:  Clonus is absent.     Assessment/Plan: 82 y.o. female here as a referral from Dr. Vista Lawman for memory problems. PMHx HTN, DM, HLD. MoCA 23/30.  She has a headache for  several months which is likely behavioral and improved. CT of the head, esr and crp all unremarkable. Headache worse with agitation which is increasing.  Repeat MoCA at next appointment Will see her back in 6 months Continue Aricept. Can discuss  increasing Aricept at next appointment.  To prevent or relieve headaches, try the following:  Cool Compress. Lie down and place a cool compress on your head.   Avoid headache triggers. If certain foods or odors seem to have triggered your migraines in the past, avoid them. A headache diary might help you identify triggers.   Include physical activity in your daily routine. Try a daily walk or other moderate aerobic exercise.   Manage stress. Find healthy ways to cope with the stressors, such as delegating tasks on your to-do list.   Practice relaxation techniques. Try deep breathing, yoga, massage and visualization.   Eat regularly. Eating regularly scheduled meals and maintaining a healthy diet might help prevent headaches. Also, drink plenty of fluids.   Follow a regular sleep schedule. Sleep deprivation might contribute to headaches  Consider biofeedback. With this mind-body technique, you learn to control certain bodily functions - such as muscle tension, heart rate and blood pressure - to prevent headaches or reduce headache pain.   Proceed to emergency room if you experience new or worsening symptoms or symptoms do not resolve, if you have new neurologic symptoms or if headache is severe, or for any concerning symptom.     Sarina Ill, MD  [facility] [address] [place], [place] [address]  Phone [phone] Fax  [phone]  A total of 30 minutes was spent face-to-face with this patient. Over half this time was spent on counseling patient on the headache and dementia diagnosis and different diagnostic and therapeutic options available.

## 2015-09-29 NOTE — Patient Instructions (Addendum)
Remember to drink plenty of fluid, eat healthy meals and do not skip any meals. Try to eat protein with a every meal and eat a healthy snack such as fruit or nuts in between meals. Try to keep a regular sleep-wake schedule and try to exercise daily, particularly in the form of walking, 20-30 minutes a day, if you can.   I would like to see you back in 6 months, sooner if we need to. Please call us with any interim questions, concerns, problems, updates or refill requests.   Our phone number is [phone]. We also have an after hours call service for urgent matters and there is a physician on-call for urgent questions. For any emergencies you know to call 911 or go to the nearest emergency room   

## 2015-09-30 DIAGNOSIS — E785 Hyperlipidemia, unspecified: Secondary | ICD-10-CM | POA: Diagnosis not present

## 2015-09-30 DIAGNOSIS — L237 Allergic contact dermatitis due to plants, except food: Secondary | ICD-10-CM | POA: Diagnosis not present

## 2015-09-30 DIAGNOSIS — F419 Anxiety disorder, unspecified: Secondary | ICD-10-CM | POA: Diagnosis not present

## 2015-09-30 DIAGNOSIS — E119 Type 2 diabetes mellitus without complications: Secondary | ICD-10-CM | POA: Diagnosis not present

## 2015-09-30 DIAGNOSIS — H409 Unspecified glaucoma: Secondary | ICD-10-CM | POA: Diagnosis not present

## 2015-09-30 DIAGNOSIS — I10 Essential (primary) hypertension: Secondary | ICD-10-CM | POA: Diagnosis not present

## 2015-09-30 DIAGNOSIS — R51 Headache: Secondary | ICD-10-CM | POA: Diagnosis not present

## 2015-09-30 DIAGNOSIS — E039 Hypothyroidism, unspecified: Secondary | ICD-10-CM | POA: Diagnosis not present

## 2015-09-30 DIAGNOSIS — F0390 Unspecified dementia without behavioral disturbance: Secondary | ICD-10-CM | POA: Diagnosis not present

## 2015-09-30 DIAGNOSIS — E559 Vitamin D deficiency, unspecified: Secondary | ICD-10-CM | POA: Diagnosis not present

## 2015-10-06 ENCOUNTER — Encounter (HOSPITAL_COMMUNITY): Payer: Self-pay | Admitting: Emergency Medicine

## 2015-10-06 ENCOUNTER — Emergency Department (HOSPITAL_COMMUNITY): Payer: Medicare Other

## 2015-10-06 ENCOUNTER — Emergency Department (HOSPITAL_COMMUNITY)
Admission: EM | Admit: 2015-10-06 | Discharge: 2015-10-07 | Disposition: A | Discharge status: Home / Self Care | Payer: Medicare Other | Attending: Emergency Medicine | Admitting: Emergency Medicine

## 2015-10-06 DIAGNOSIS — E119 Type 2 diabetes mellitus without complications: Secondary | ICD-10-CM | POA: Insufficient documentation

## 2015-10-06 DIAGNOSIS — R51 Headache: Secondary | ICD-10-CM | POA: Diagnosis not present

## 2015-10-06 DIAGNOSIS — E559 Vitamin D deficiency, unspecified: Secondary | ICD-10-CM | POA: Diagnosis not present

## 2015-10-06 DIAGNOSIS — Z79899 Other long term (current) drug therapy: Secondary | ICD-10-CM | POA: Insufficient documentation

## 2015-10-06 DIAGNOSIS — R4182 Altered mental status, unspecified: Secondary | ICD-10-CM | POA: Diagnosis not present

## 2015-10-06 DIAGNOSIS — R197 Diarrhea, unspecified: Secondary | ICD-10-CM | POA: Diagnosis not present

## 2015-10-06 DIAGNOSIS — Z880 Allergy status to penicillin: Secondary | ICD-10-CM | POA: Diagnosis not present

## 2015-10-06 DIAGNOSIS — R1013 Epigastric pain: Secondary | ICD-10-CM | POA: Diagnosis not present

## 2015-10-06 DIAGNOSIS — E860 Dehydration: Secondary | ICD-10-CM | POA: Insufficient documentation

## 2015-10-06 DIAGNOSIS — G8929 Other chronic pain: Secondary | ICD-10-CM | POA: Diagnosis not present

## 2015-10-06 DIAGNOSIS — Z8659 Personal history of other mental and behavioral disorders: Secondary | ICD-10-CM | POA: Insufficient documentation

## 2015-10-06 DIAGNOSIS — Z8719 Personal history of other diseases of the digestive system: Secondary | ICD-10-CM | POA: Insufficient documentation

## 2015-10-06 DIAGNOSIS — E785 Hyperlipidemia, unspecified: Secondary | ICD-10-CM | POA: Diagnosis not present

## 2015-10-06 DIAGNOSIS — E039 Hypothyroidism, unspecified: Secondary | ICD-10-CM | POA: Diagnosis not present

## 2015-10-06 DIAGNOSIS — R1011 Right upper quadrant pain: Secondary | ICD-10-CM | POA: Diagnosis not present

## 2015-10-06 DIAGNOSIS — H9190 Unspecified hearing loss, unspecified ear: Secondary | ICD-10-CM | POA: Insufficient documentation

## 2015-10-06 DIAGNOSIS — I10 Essential (primary) hypertension: Secondary | ICD-10-CM | POA: Diagnosis not present

## 2015-10-06 DIAGNOSIS — H409 Unspecified glaucoma: Secondary | ICD-10-CM | POA: Diagnosis not present

## 2015-10-06 DIAGNOSIS — R110 Nausea: Secondary | ICD-10-CM | POA: Diagnosis not present

## 2015-10-06 DIAGNOSIS — R630 Anorexia: Secondary | ICD-10-CM | POA: Insufficient documentation

## 2015-10-06 DIAGNOSIS — Z9049 Acquired absence of other specified parts of digestive tract: Secondary | ICD-10-CM | POA: Insufficient documentation

## 2015-10-06 DIAGNOSIS — R109 Unspecified abdominal pain: Secondary | ICD-10-CM | POA: Diagnosis not present

## 2015-10-06 DIAGNOSIS — F419 Anxiety disorder, unspecified: Secondary | ICD-10-CM | POA: Diagnosis not present

## 2015-10-06 LAB — CBC
HEMATOCRIT: 41.2 % (ref 36.0–46.0)
Hemoglobin: 13.7 g/dL (ref 12.0–15.0)
MCH: 29.2 pg (ref 26.0–34.0)
MCHC: 33.3 g/dL (ref 30.0–36.0)
MCV: 87.8 fL (ref 78.0–100.0)
PLATELETS: 328 10*3/uL (ref 150–400)
RBC: 4.69 MIL/uL (ref 3.87–5.11)
RDW: 13.9 % (ref 11.5–15.5)
WBC: 4.3 10*3/uL (ref 4.0–10.5)

## 2015-10-06 LAB — COMPREHENSIVE METABOLIC PANEL
ALT: 25 U/L (ref 14–54)
AST: 25 U/L (ref 15–41)
Albumin: 4.3 g/dL (ref 3.5–5.0)
Alkaline Phosphatase: 105 U/L (ref 38–126)
Anion gap: 13 (ref 5–15)
BILIRUBIN TOTAL: 2.4 mg/dL — AB (ref 0.3–1.2)
BUN: 23 mg/dL — ABNORMAL HIGH (ref 6–20)
CHLORIDE: 99 mmol/L — AB (ref 101–111)
CO2: 23 mmol/L (ref 22–32)
CREATININE: 1.06 mg/dL — AB (ref 0.44–1.00)
Calcium: 9.6 mg/dL (ref 8.9–10.3)
GFR, EST AFRICAN AMERICAN: 52 mL/min — AB (ref >60)
GFR, EST NON AFRICAN AMERICAN: 45 mL/min — AB (ref >60)
Glucose, Bld: 119 mg/dL — ABNORMAL HIGH (ref 65–99)
POTASSIUM: 3.8 mmol/L (ref 3.5–5.1)
Sodium: 135 mmol/L (ref 135–145)
TOTAL PROTEIN: 7.7 g/dL (ref 6.5–8.1)

## 2015-10-06 LAB — LIPASE, BLOOD: LIPASE: 52 U/L — AB (ref 11–51)

## 2015-10-06 MED ORDER — ONDANSETRON 4 MG PO TBDP: ORAL_TABLET | ORAL | Status: DC

## 2015-10-06 MED ORDER — SODIUM CHLORIDE 0.9 % IV BOLUS (SEPSIS)
1000.0000 mL | Freq: Once | INTRAVENOUS | Status: AC
  Administered 2015-10-06: 1000 mL via INTRAVENOUS

## 2015-10-06 MED ORDER — ONDANSETRON HCL 4 MG/2ML IJ SOLN
4.0000 mg | Freq: Once | INTRAMUSCULAR | Status: AC
  Administered 2015-10-06: 4 mg via INTRAVENOUS

## 2015-10-06 MED ORDER — ONDANSETRON HCL 4 MG/2ML IJ SOLN
4.0000 mg | Freq: Once | INTRAMUSCULAR | Status: DC
  Filled 2015-10-06: qty 2

## 2015-10-06 MED ORDER — LOPERAMIDE HCL 2 MG PO CAPS
4.0000 mg | ORAL_CAPSULE | Freq: Once | ORAL | Status: AC
  Administered 2015-10-06: 4 mg via ORAL
  Filled 2015-10-06: qty 2

## 2015-10-06 NOTE — ED Notes (Signed)
Pt sts she hadnt eaten in two days and now has a headache.  Pt also made aware of urine sample that is needed. But pt sts she is " dry as a bone and they (whom ever she ask at the desk) won't let me drink nothing".

## 2015-10-06 NOTE — ED Notes (Signed)
Ultrasoud at bedside

## 2015-10-06 NOTE — ED Notes (Signed)
Pt reports diarrhea x 3 days, and abd pain . denies emesis nor recent ATB therapy. Son reports chronic headache for which she was seen today at PCP.

## 2015-10-06 NOTE — ED Notes (Signed)
Provided patient a ham sandwich and hot tea with permission from provider.

## 2015-10-06 NOTE — Discharge Instructions (Signed)
Stay hydrated.   Take zofran for nausea.   Take imodium every time you get diarrhea, up to 10 times daily.   See your doctor.   Return to ER if you have worse nausea, abdominal pain, headaches, vomiting, fevers.

## 2015-10-06 NOTE — ED Provider Notes (Addendum)
CSN: [id number]     Arrival date & time 10/06/15  1647 History   First MD Initiated Contact with Patient 10/06/15 2116     Chief Complaint  Patient presents with  . Diarrhea     (Consider location/radiation/quality/duration/timing/severity/associated sxs/prior Treatment) The history is provided by the patient.  Colleen Huang is a 82 y.o. female hx of pancreatitis, DM, Who presented with abdominal pain, headaches. Patient has poor appetite for the last 3 days. She states that when she eats she has episode of loose stool. She denies any vomiting or fevers. He has intermittent headaches but she has chronic headaches that was thought to be from anxiety. She had previous MRIs and CT that did not find any cause of her chronic headaches. She was admitted several months ago for pancreatitis and had an ERCP recently. She went to primary care doctor was sent here for evaluation. She had a shot of toradol in the office and the headache has resolved. Denies urinary symptoms.   Level V caveat- dementia    Past Medical History  Diagnosis Date  . Pancreatitis 04/2015    [facility]  . Memory difficulties     "repeats self alot"- has increasing levels of memory issues  . Hearing loss     wears hearing aids  . Diabetes mellitus without complication ([facility])     past history in Epic note from Neurlogy visit  . Headache     recent neurology visit to evaluate headaches.  . Anxiety     anxiety with episodes of angry outbursts" feels as being treated as a child"   Past Surgical History  Procedure Laterality Date  . Appendectomy  1935  . Tonsillectomy  1949  . Parathyroidectomy / exploration of parathyroids  2003  . Gallbladder surgery  1997  . Colonoscopy  2005, 2009  . Flexible sigmoidoscopy  2005  . Rectal polypectomy  2005  . Cholecystectomy    . Eus N/A 09/18/2015    Procedure: UPPER ENDOSCOPIC ULTRASOUND (EUS) LINEAR;  Surgeon: Jeani Hawking, MD;   Location: [facility];  Service: Endoscopy;  Laterality: N/A;   Family History  Problem Relation Age of Onset  . Dementia Brother    Social History  Substance Use Topics  . Smoking status: Never Smoker   . Smokeless tobacco: None  . Alcohol Use: No   OB History    No data available     Review of Systems  Gastrointestinal: Positive for nausea and diarrhea.  Neurological: Positive for headaches.  All other systems reviewed and are negative.     Allergies  Codeine; Hydrocodone; Penicillins; Percocet; and Sulfa antibiotics  Home Medications   Prior to Admission medications   Medication Sig Start Date End Date Taking? Authorizing Provider  Calcium Carb-Cholecalciferol (CALCIUM 600 + D PO) Take 1 tablet by mouth 2 (two) times daily.    Yes Historical Provider, MD  divalproex (DEPAKOTE ER) 250 MG 24 hr tablet Take 250 mg by mouth daily.  08/27/15  Yes Historical Provider, MD  donepezil (ARICEPT) 10 MG tablet Take 1 tablet (10 mg total) by mouth at bedtime. 02/10/15  Yes Anson Fret, MD  etodolac (LODINE) 500 MG tablet Take 500 mg by mouth 2 (two) times daily as needed (pain).  07/22/15  Yes Historical Provider, MD  latanoprost (XALATAN) 0.005 % ophthalmic solution Place 1 drop into both eyes at bedtime.    Yes Historical Provider, MD  levothyroxine (SYNTHROID, LEVOTHROID) 50 MCG  tablet Take 2.5 tablets (125 mcg total) by mouth daily before breakfast. Patient taking differently: Take 125 mcg by mouth daily before breakfast.  09/29/15  Yes Anson Fret, MD  Omega-3 Fatty Acids (FISH OIL PO) Take 1 capsule by mouth 2 (two) times daily.   Yes Historical Provider, MD  Pancrelipase, Lip-Prot-Amyl, (ZENPEP PO) Take 2 capsules by mouth 3 (three) times daily.   Yes Historical Provider, MD  pantoprazole (PROTONIX) 40 MG tablet Take 40 mg by mouth daily.  09/03/15  Yes Historical Provider, MD  timolol (TIMOPTIC-XR) 0.5 % ophthalmic gel-forming Place 1 drop into both eyes every morning.   Yes  Historical Provider, MD   BP 118/80 mmHg  Pulse 67  Temp(Src) 98.3 F (36.8 C) (Oral)  Resp 18  SpO2 100% Physical Exam  Constitutional: She is oriented to person, place, and time. She appears well-developed.  Slightly dehydrated   HENT:  Head: Normocephalic.  MM slightly dry   Eyes: Conjunctivae are normal. Pupils are equal, round, and reactive to light.  Neck: Normal range of motion. Neck supple.  Cardiovascular: Normal rate, regular rhythm and normal heart sounds.   Pulmonary/Chest: Effort normal and breath sounds normal. No respiratory distress. She has no wheezes. She has no rales.  Abdominal: Soft. Bowel sounds are normal.  + mild epigastric tenderness, no rebound, minimal RUQ tenderness, no murphy's   Musculoskeletal: Normal range of motion. She exhibits no edema or tenderness.  Neurological: She is alert and oriented to person, place, and time. No cranial nerve deficit. Coordination normal.  CN 2-12 intact. Nl strength throughout. Nl sensation throughout.   Skin: Skin is warm and dry.  Psychiatric: She has a normal mood and affect. Her behavior is normal. Judgment and thought content normal.  Nursing note and vitals reviewed.   ED Course  Procedures (including critical care time) Labs Review Labs Reviewed  LIPASE, BLOOD - Abnormal; Notable for the following:    Lipase 52 (*)    All other components within normal limits  COMPREHENSIVE METABOLIC PANEL - Abnormal; Notable for the following:    Chloride 99 (*)    Glucose, Bld 119 (*)    BUN 23 (*)    Creatinine, Ser 1.06 (*)    Total Bilirubin 2.4 (*)    GFR calc non Af Amer 45 (*)    GFR calc Af Amer 52 (*)    All other components within normal limits  CBC  URINALYSIS, ROUTINE W REFLEX MICROSCOPIC (NOT AT [facility])    Imaging Review Dg Abd 1 View  10/06/2015  CLINICAL DATA:  Diarrhea and abdominal pain EXAM: ABDOMEN - 1 VIEW COMPARISON:  None. FINDINGS: Scattered large and small bowel gas is noted. No obstructive  changes are seen. Postoperative changes are noted. No free air is seen. Degenerative changes of lumbar spine are noted. IMPRESSION: No acute abnormality noted. Electronically Signed   By: Alcide Clever M.D.   On: 10/06/2015 21:57   US Abdomen Complete  10/06/2015  CLINICAL DATA:  Abdominal pain with elevated bilirubin and lipase EXAM: ABDOMEN ULTRASOUND COMPLETE COMPARISON:  MRCP 08/31/2015 FINDINGS: Gallbladder: Surgically absent Common bile duct: Diameter: 9 mm and stable from prior. Where visualized, no filling defect. There is chronic intrahepatic bile duct enlargement. Liver: There is punctate hyperechoic structure/s repeatedly seen in the left liver with ring down artifact. No pneumobilia seen on previous imaging. No evidence of focal mass. Antegrade flow in the main portal vein. IVC: No abnormality visualized. Pancreas: 2 to 3 mm main  duct. The patient underwent recent endoscopic ultrasound showing no pancreatic mass. There is no visible pancreatitis to correlate with history. Spleen: Size and appearance within normal limits. Right Kidney: Length: 10 cm. Echogenicity within normal limits. No mass or hydronephrosis visualized. Left Kidney: Length: 12 cm. Echogenicity within normal limits. No mass or hydronephrosis visualized. Abdominal aorta: Atherosclerosis without aneurysm. IMPRESSION: 1. Chronic bile duct enlargement without visible choledocholithiasis. 2. Evidence of pneumobilia in the left liver. Has there been prior sphincterotomy? 3. No evidence of peripancreatic collection. Electronically Signed   By: Marnee SpringJonathon  Watts M.D.   On: 10/06/2015 23:12   I have personally reviewed and evaluated these images and lab results as part of my medical decision-making.   EKG Interpretation None      MDM   Final diagnoses:  None   Colleen Huang is a 82 y.o. female here with headache, epigastric pain, diarrhea. Likely gastro vs pancreatitis. Will get labs, lipase. Will hydrate and reassess.    12:01 AM Cr 1.0, slightly elevated. Given IVF and zofran and tolerated PO in the ED. Bili slightly elevated 2.4, but AST/ALT nl. Lipase 52 but baseline around 100 (it was 2600 when she was admitted recently). US showed chronic bile duct enlargement, some pneumobilia likely from recent ERCP. Will dc home with zofran, prn imodium for diarrhea.      Richardean Canalavid H Yao, MD 10/07/15 Marlyne Beards0002  Richardean Canalavid H Yao, MD 10/07/15 [phone]

## 2015-10-09 DIAGNOSIS — J343 Hypertrophy of nasal turbinates: Secondary | ICD-10-CM | POA: Diagnosis not present

## 2015-10-09 DIAGNOSIS — R51 Headache: Secondary | ICD-10-CM | POA: Diagnosis not present

## 2015-10-09 DIAGNOSIS — T161XXA Foreign body in right ear, initial encounter: Secondary | ICD-10-CM | POA: Diagnosis not present

## 2015-10-09 DIAGNOSIS — J342 Deviated nasal septum: Secondary | ICD-10-CM | POA: Diagnosis not present

## 2015-10-12 ENCOUNTER — Ambulatory Visit: Payer: Medicare Other | Admitting: Neurology

## 2015-11-11 DIAGNOSIS — E785 Hyperlipidemia, unspecified: Secondary | ICD-10-CM | POA: Diagnosis not present

## 2015-11-11 DIAGNOSIS — F0390 Unspecified dementia without behavioral disturbance: Secondary | ICD-10-CM | POA: Diagnosis not present

## 2015-11-11 DIAGNOSIS — R51 Headache: Secondary | ICD-10-CM | POA: Diagnosis not present

## 2015-11-11 DIAGNOSIS — I10 Essential (primary) hypertension: Secondary | ICD-10-CM | POA: Diagnosis not present

## 2015-11-11 DIAGNOSIS — E559 Vitamin D deficiency, unspecified: Secondary | ICD-10-CM | POA: Diagnosis not present

## 2015-11-11 DIAGNOSIS — F419 Anxiety disorder, unspecified: Secondary | ICD-10-CM | POA: Diagnosis not present

## 2015-11-11 DIAGNOSIS — N390 Urinary tract infection, site not specified: Secondary | ICD-10-CM | POA: Diagnosis not present

## 2015-11-11 DIAGNOSIS — H409 Unspecified glaucoma: Secondary | ICD-10-CM | POA: Diagnosis not present

## 2015-11-11 DIAGNOSIS — E039 Hypothyroidism, unspecified: Secondary | ICD-10-CM | POA: Diagnosis not present

## 2015-11-11 DIAGNOSIS — E119 Type 2 diabetes mellitus without complications: Secondary | ICD-10-CM | POA: Diagnosis not present

## 2015-11-17 ENCOUNTER — Emergency Department (HOSPITAL_COMMUNITY)
Admission: EM | Admit: 2015-11-17 | Discharge: 2015-11-17 | Disposition: A | Discharge status: Home / Self Care | Payer: Medicare Other | Attending: Emergency Medicine | Admitting: Emergency Medicine

## 2015-11-17 ENCOUNTER — Encounter (HOSPITAL_COMMUNITY): Payer: Self-pay | Admitting: Family Medicine

## 2015-11-17 ENCOUNTER — Emergency Department (HOSPITAL_COMMUNITY): Payer: Medicare Other

## 2015-11-17 DIAGNOSIS — R51 Headache: Secondary | ICD-10-CM | POA: Diagnosis not present

## 2015-11-17 DIAGNOSIS — E119 Type 2 diabetes mellitus without complications: Secondary | ICD-10-CM | POA: Diagnosis not present

## 2015-11-17 DIAGNOSIS — G43909 Migraine, unspecified, not intractable, without status migrainosus: Secondary | ICD-10-CM | POA: Diagnosis not present

## 2015-11-17 DIAGNOSIS — R519 Headache, unspecified: Secondary | ICD-10-CM

## 2015-11-17 LAB — VALPROIC ACID LEVEL

## 2015-11-17 LAB — URINALYSIS, ROUTINE W REFLEX MICROSCOPIC
Glucose, UA: NEGATIVE mg/dL
Hgb urine dipstick: NEGATIVE
Ketones, ur: NEGATIVE mg/dL
LEUKOCYTES UA: NEGATIVE
NITRITE: NEGATIVE
PH: 5.5 (ref 5.0–8.0)
Protein, ur: NEGATIVE mg/dL
SPECIFIC GRAVITY, URINE: 1.01 (ref 1.005–1.030)

## 2015-11-17 LAB — BASIC METABOLIC PANEL
ANION GAP: 6 (ref 5–15)
BUN: 22 mg/dL — ABNORMAL HIGH (ref 6–20)
CHLORIDE: 107 mmol/L (ref 101–111)
CO2: 27 mmol/L (ref 22–32)
CREATININE: 0.98 mg/dL (ref 0.44–1.00)
Calcium: 8.7 mg/dL — ABNORMAL LOW (ref 8.9–10.3)
GFR calc non Af Amer: 49 mL/min — ABNORMAL LOW (ref >60)
GFR, EST AFRICAN AMERICAN: 57 mL/min — AB (ref >60)
Glucose, Bld: 108 mg/dL — ABNORMAL HIGH (ref 65–99)
POTASSIUM: 3.5 mmol/L (ref 3.5–5.1)
SODIUM: 140 mmol/L (ref 135–145)

## 2015-11-17 LAB — CBC WITH DIFFERENTIAL/PLATELET
BASOS PCT: 0 %
Basophils Absolute: 0 10*3/uL (ref 0.0–0.1)
EOS ABS: 0 10*3/uL (ref 0.0–0.7)
Eosinophils Relative: 1 %
HEMATOCRIT: 31.2 % — AB (ref 36.0–46.0)
HEMOGLOBIN: 10.7 g/dL — AB (ref 12.0–15.0)
Lymphocytes Relative: 36 %
Lymphs Abs: 1.7 10*3/uL (ref 0.7–4.0)
MCH: 29.6 pg (ref 26.0–34.0)
MCHC: 34.3 g/dL (ref 30.0–36.0)
MCV: 86.4 fL (ref 78.0–100.0)
MONOS PCT: 11 %
Monocytes Absolute: 0.5 10*3/uL (ref 0.1–1.0)
NEUTROS ABS: 2.4 10*3/uL (ref 1.7–7.7)
NEUTROS PCT: 52 %
Platelets: 207 10*3/uL (ref 150–400)
RBC: 3.61 MIL/uL — AB (ref 3.87–5.11)
RDW: 13.7 % (ref 11.5–15.5)
WBC: 4.6 10*3/uL (ref 4.0–10.5)

## 2015-11-17 MED ORDER — MORPHINE SULFATE (PF) 2 MG/ML IV SOLN
1.0000 mg | Freq: Once | INTRAVENOUS | Status: AC
  Administered 2015-11-17: 1 mg via INTRAVENOUS
  Filled 2015-11-17: qty 1

## 2015-11-17 MED ORDER — METOCLOPRAMIDE HCL 5 MG/ML IJ SOLN
5.0000 mg | Freq: Once | INTRAMUSCULAR | Status: AC
  Administered 2015-11-17: 5 mg via INTRAVENOUS
  Filled 2015-11-17: qty 2

## 2015-11-17 MED ORDER — SODIUM CHLORIDE 0.9 % IV BOLUS (SEPSIS)
500.0000 mL | Freq: Once | INTRAVENOUS | Status: AC
  Administered 2015-11-17: 500 mL via INTRAVENOUS

## 2015-11-17 NOTE — Care Management Note (Addendum)
Case Management Note  Patient Details  Name: Colleen Huang MRN: [id number] Date of Birth: 06/24/1923  Subjective/Objective:   Patient presents to ED with headache.                 Action/Plan:  EDCM nspoke to patient and her son at bedside.  Patient is hoh so patient's son answered all questions.  [name] lives with the patient.  [name] is agreeable to have [facility] for home health services as patient has had them in the past.  [name] reports patient has a walker at home but she doesn't need to use it.  No further dme needs at this time per patient's son.  [facility].  [name] confirms  patient's pcp is Dr. Julio Sickssei-Bonsu.  Discussed patient with EDP who will place orders for RN, PT, OT aide and social work.  No further EDCM needs at this time.   Expected Discharge Date:                  Expected Discharge Plan:  Home w Home Health Services  In-House Referral:     Discharge planning Services  CM Consult  Post Acute Care Choice:  Home Health Choice offered to:  Patient, Adult Children  DME Arranged:   (none required per patient's son) DME Agency:     HH Arranged:  RN, PT, OT, Nurse's Aide, Social Work [facility]  [facility]  Status of Service:  Completed, signed off  Medicare Important Message Given:    Date Medicare IM Given:    Medicare IM give by:    Date Additional Medicare IM Given:    Additional Medicare Important Message give by:     If discussed at Long Length of Stay Meetings, dates discussed:    Additional Comments:  [facility].  Radford PaxFERRERO, Shaketta Rill, RN 11/17/2015, 8:37 PM

## 2015-11-17 NOTE — ED Notes (Signed)
Patient and family at bedside updated with patient's plan of care.

## 2015-11-17 NOTE — ED Notes (Signed)
Informed [name], PA that when patient attempted to obtain urine sample, she had stool mixed with urine. In and out ordered.

## 2015-11-17 NOTE — ED Provider Notes (Signed)
Pt seen and evaluated.  Pt states that HA is "Much Better".  Calm and interactive.  Reassuring CT and studies. Non focal exam.  Appropriate for DC.  Rolland PorterMark Ramaj Frangos, MD 11/17/15 [phone]

## 2015-11-17 NOTE — ED Notes (Signed)
[facility] home [facility]. Pt is complaining of migraine that started this morning. Also, pt is being treated for an UTI. Pt's son gave pt 1/2 tablet of Xanax today for increase agitation with no relief.

## 2015-11-17 NOTE — ED Provider Notes (Signed)
CSN: [id number]     Arrival date & time 11/17/15  1528 History   First MD Initiated Contact with Patient 11/17/15 1537     Chief Complaint  Patient presents with  . Migraine     (Consider location/radiation/quality/duration/timing/severity/associated sxs/prior Treatment) HPI Comments: Colleen Huang is a 82 y.o. Female with history of dyslipidemia, hypertension, and dementia presents to ED via EMS with headache. Patient states headache started at lunchtime today. Headache was gradual in onset. However pain is significant and patient describes as pain severe enough to "make someone cry" or "throw things." Patient does not give a pain level or description of pain except for that it "hurts." Associated ear fullness, no changes in hearing, and dizziness. Denies fever, chills, night sweats, nausea, vomiting, numbness, weakness, lightheadedness, LOC, neck pain/stiffness, rash, changes in vision, photophobia, phonophobia. No chest pain or shortness of breath. No abdominal complaints. No urinary complaints.  Level V caveat - dementia  Patient is a 82 y.o. female presenting with migraines. The history is provided by the patient and medical records.  Migraine Associated symptoms include headaches.    Past Medical History  Diagnosis Date  . Pancreatitis 04/2015    [facility]  . Memory difficulties     "repeats self alot"- has increasing levels of memory issues  . Hearing loss     wears hearing aids  . Diabetes mellitus without complication (HCC)     past history in Epic note from Neurlogy visit  . Headache     recent neurology visit to evaluate headaches.  . Anxiety     anxiety with episodes of angry outbursts" feels as being treated as a child"   Past Surgical History  Procedure Laterality Date  . Appendectomy  1935  . Tonsillectomy  1949  . Parathyroidectomy / exploration of parathyroids  2003  . Gallbladder surgery  1997  . Colonoscopy   2005, 2009  . Flexible sigmoidoscopy  2005  . Rectal polypectomy  2005  . Cholecystectomy    . Eus N/A 09/18/2015    Procedure: UPPER ENDOSCOPIC ULTRASOUND (EUS) LINEAR;  Surgeon: Jeani HawkingPatrick Hung, MD;  Location: [facility] END[facility];  Service: Endoscopy;  Laterality: N/A;   Family History  Problem Relation Age of Onset  . Dementia Brother    Social History  Substance Use Topics  . Smoking status: Never Smoker   . Smokeless tobacco: None  . Alcohol Use: No   OB History    No data available     Review of Systems  HENT: Positive for ear pain ( described as fullness).   Neurological: Positive for dizziness and headaches.  All other systems reviewed and are negative.     Allergies  Codeine; Hydrocodone; Penicillins; Percocet; and Sulfa antibiotics  Home Medications   Prior to Admission medications   Medication Sig Start Date End Date Taking? Authorizing Provider  amLODipine (NORVASC) 10 MG tablet Take 10 mg by mouth daily. 11/03/15  Yes Historical Provider, MD  Calcium Carb-Cholecalciferol (CALCIUM 600 + D PO) Take 1 tablet by mouth 2 (two) times daily.    Yes Historical Provider, MD  ciprofloxacin (CIPRO) 500 MG tablet Take 500 mg by mouth every 12 (twelve) hours. ABT Start Date 11/16/15 & End Date 11/19/15. 11/16/15  Yes Historical Provider, MD  divalproex (DEPAKOTE ER) 250 MG 24 hr tablet Take 250 mg by mouth daily.  08/27/15  Yes Historical Provider, MD  donepezil (ARICEPT) 10 MG tablet Take 1 tablet (10 mg total)  by mouth at bedtime. 02/10/15  Yes Anson Fret, MD  etodolac (LODINE) 500 MG tablet Take 500 mg by mouth 2 (two) times daily as needed (pain).  07/22/15  Yes Historical Provider, MD  latanoprost (XALATAN) 0.005 % ophthalmic solution Place 1 drop into both eyes at bedtime.    Yes Historical Provider, MD  levothyroxine (SYNTHROID, LEVOTHROID) 50 MCG tablet Take 2.5 tablets (125 mcg total) by mouth daily before breakfast. Patient taking differently: Take 125 mcg by mouth daily before  breakfast.  09/29/15  Yes Anson Fret, MD  Omega-3 Fatty Acids (FISH OIL PO) Take 1 capsule by mouth 2 (two) times daily.   Yes Historical Provider, MD  Pancrelipase, Lip-Prot-Amyl, (ZENPEP PO) Take 2 capsules by mouth 3 (three) times daily.   Yes Historical Provider, MD  pantoprazole (PROTONIX) 40 MG tablet Take 40 mg by mouth daily.  09/03/15  Yes Historical Provider, MD  timolol (TIMOPTIC-XR) 0.5 % ophthalmic gel-forming Place 1 drop into both eyes every morning.   Yes Historical Provider, MD  ondansetron (ZOFRAN ODT) 4 MG disintegrating tablet 4mg  ODT q6 hours prn nausea/vomit 10/06/15   Richardean Canal, MD   BP 114/49 mmHg  Pulse 52  Temp(Src) 98 F (36.7 C) (Oral)  Resp 18  SpO2 99% Physical Exam  Constitutional: She appears well-developed and well-nourished. No distress.  HENT:  Head: Normocephalic and atraumatic.  Mouth/Throat: Oropharynx is clear and moist. No oropharyngeal exudate.  Eyes: Conjunctivae and EOM are normal. Pupils are equal, round, and reactive to light. Right eye exhibits no discharge. Left eye exhibits no discharge. No scleral icterus.  Neck: Normal range of motion. Neck supple.  Cardiovascular: Normal rate, regular rhythm, normal heart sounds and intact distal pulses.   No murmur heard. Pulmonary/Chest: Effort normal and breath sounds normal. No respiratory distress.  Abdominal: Soft. Bowel sounds are normal. There is no tenderness. There is no rebound and no guarding.  Musculoskeletal: Normal range of motion.  Lymphadenopathy:    She has no cervical adenopathy.  Neurological: She is alert. Coordination normal.  Mental Status:  Alert, oriented, thought content appropriate, able to give a coherent history. Speech fluent without evidence of aphasia. Able to follow 2 step commands without difficulty.  Cranial Nerves:  II:  Peripheral visual fields grossly normal, pupils equal, round, reactive to light III,IV, VI: ptosis not present, extra-ocular motions intact  bilaterally  V,VII: smile symmetric, facial light touch sensation equal VIII: hearing grossly normal to voice  X: uvula elevates symmetrically  XI: bilateral shoulder shrug symmetric and strong XII: midline tongue extension without fassiculations Motor:  Normal tone. 5/5 in upper and lower extremities bilaterally including strong and equal grip strength and dorsiflexion/plantar flexion Sensory: sensation grossly intact.  Cerebellar: normal finger-to-nose with bilateral upper extremities Gait: gait is somewhat unsteady; however, per son this is her baseline.  CV: distal pulses palpable throughout     Skin: Skin is warm and dry. She is not diaphoretic.  Psychiatric: She has a normal mood and affect. Her behavior is normal.    ED Course  Procedures (including critical care time) Labs Review Labs Reviewed  CBC WITH DIFFERENTIAL/PLATELET - Abnormal; Notable for the following:    RBC 3.61 (*)    Hemoglobin 10.7 (*)    HCT 31.2 (*)    All other components within normal limits  BASIC METABOLIC PANEL - Abnormal; Notable for the following:    Glucose, Bld 108 (*)    BUN 22 (*)    Calcium 8.7 (*)  GFR calc non Af Amer 49 (*)    GFR calc Af Amer 57 (*)    All other components within normal limits  URINALYSIS, ROUTINE W REFLEX MICROSCOPIC (NOT AT [facility]) - Abnormal; Notable for the following:    Bilirubin Urine LARGE (*)    All other components within normal limits  VALPROIC ACID LEVEL - Abnormal; Notable for the following:    Valproic Acid Lvl <10 (*)    All other components within normal limits    Imaging Review Ct Head Wo Contrast  11/17/2015  CLINICAL DATA:  Intractable headache. EXAM: CT HEAD WITHOUT CONTRAST TECHNIQUE: Contiguous axial images were obtained from the base of the skull through the vertex without intravenous contrast. COMPARISON:  06/25/2015 FINDINGS: Brain: No evidence of acute infarction, hemorrhage, extra-axial collection, ventriculomegaly, or mass effect. There is  stable atrophy and chronic small vessel disease changes. Vascular: No hyperdense vessel or unexpected calcification. Skull: Negative for fracture or focal lesion. Sinuses/Orbits: No acute findings. Other: None. IMPRESSION: No acute intracranial abnormality. Atrophy, chronic microvascular disease. Electronically Signed   By: Ted Mcalpine M.D.   On: 11/17/2015 17:55   I have personally reviewed and evaluated these images and lab results as part of my medical decision-making.   EKG Interpretation None      MDM   Final diagnoses:  Nonintractable headache, unspecified chronicity pattern, unspecified headache type    Patient is afebrile and well-appearing. Vital signs are stable. Physical exam is reassuring. No head trauma, doubt subdural or epidural hematoma. Neurologic exam normal, doubt stroke. Challenge to ascertain onset and severity. Will CT head to r/o secondary causes of headache. IVF, morphine, and reglan given.   CT negative for acute process. CBC remarkable for slightly low hemoglobin; however, previous labs show similar findings. CMP shows mildly elevated BUN: however, stable compared to previous labs. Calcium mildly low. U/A negative for UTI. Valproic acid low. On re-evaluation patient endorses slight improvement in headache. She is requesting something to eat and drink and to go home.   Discussed results with patient and son. Some concern for possible caregiver burnout. Will consult care management for home health needs. Provided return precautions. Encouraged follow up with PCP in next week for re-evaluation. Son voiced understanding and is agreeable.     Lona Kettle, [place] 11/18/15 1610  Leta Baptist, MD 11/23/15 2308

## 2015-11-17 NOTE — Discharge Instructions (Signed)
Read the information below.   Use the prescribed medication as directed.  Please discuss all new medications with your pharmacist.   You can try ibuprofen or tylenol at home for headache relief.  Be sure to call and schedule follow up with PCP in next week for re-evaluation.  You have also had a consult with care management. They will be in contact regarding home health needs.  You may return to the Emergency Department at any time for worsening condition or any new symptoms that concern you. Return to ED if you develop worsening symptoms or have changes in vision, numbness, weakness, loss of consciousness, slurred speech, or facial droop.    General Headache Without Cause A headache is pain or discomfort felt around the head or neck area. There are many causes and types of headaches. In some cases, the cause may not be found.  HOME CARE  Managing Pain  Take over-the-counter and prescription medicines only as told by your doctor.  Lie down in a dark, quiet room when you have a headache.  If directed, apply ice to the head and neck area:  Put ice in a plastic bag.  Place a towel between your skin and the bag.  Leave the ice on for 20 minutes, 2-3 times per day.  Use a heating pad or hot shower to apply heat to the head and neck area as told by your doctor.  Keep lights dim if bright lights bother you or make your headaches worse. Eating and Drinking  Eat meals on a regular schedule.  Lessen how much alcohol you drink.  Lessen how much caffeine you drink, or stop drinking caffeine. General Instructions  Keep all follow-up visits as told by your doctor. This is important.  Keep a journal to find out if certain things bring on headaches. For example, write down:  What you eat and drink.  How much sleep you get.  Any change to your diet or medicines.  Relax by getting a massage or doing other relaxing activities.  Lessen stress.  Sit up straight. Do not tighten (tense)  your muscles.  Do not use tobacco products. This includes cigarettes, chewing tobacco, or e-cigarettes. If you need help quitting, ask your doctor.  Exercise regularly as told by your doctor.  Get enough sleep. This often means 7-9 hours of sleep. GET HELP IF:  Your symptoms are not helped by medicine.  You have a headache that feels different than the other headaches.  You feel sick to your stomach (nauseous) or you throw up (vomit).  You have a fever. GET HELP RIGHT AWAY IF:   Your headache becomes really bad.  You keep throwing up.  You have a stiff neck.  You have trouble seeing.  You have trouble speaking.  You have pain in the eye or ear.  Your muscles are weak or you lose muscle control.  You lose your balance or have trouble walking.  You feel like you will pass out (faint) or you pass out.  You have confusion.   This information is not intended to replace advice given to you by your health care provider. Make sure you discuss any questions you have with your health care provider.   Document Released: 03/08/2008 Document Revised: 02/18/2015 Document Reviewed: 09/22/2014 Elsevier Interactive Patient Education [employer]2016 Elsevier Inc.

## 2015-11-17 NOTE — ED Notes (Signed)
Attempted to get a urine sample from the pt, pt had a bowel movement and contaminated the sample.

## 2015-11-20 ENCOUNTER — Telehealth: Payer: Self-pay | Admitting: Neurology

## 2015-11-20 DIAGNOSIS — G43909 Migraine, unspecified, not intractable, without status migrainosus: Secondary | ICD-10-CM | POA: Diagnosis not present

## 2015-11-20 DIAGNOSIS — E119 Type 2 diabetes mellitus without complications: Secondary | ICD-10-CM | POA: Diagnosis not present

## 2015-11-20 DIAGNOSIS — F0390 Unspecified dementia without behavioral disturbance: Secondary | ICD-10-CM | POA: Diagnosis not present

## 2015-11-20 DIAGNOSIS — I10 Essential (primary) hypertension: Secondary | ICD-10-CM | POA: Diagnosis not present

## 2015-11-20 DIAGNOSIS — F419 Anxiety disorder, unspecified: Secondary | ICD-10-CM | POA: Diagnosis not present

## 2015-11-20 DIAGNOSIS — E785 Hyperlipidemia, unspecified: Secondary | ICD-10-CM | POA: Diagnosis not present

## 2015-11-20 NOTE — Telephone Encounter (Signed)
Do not send the xanax prescription. I do not recommend giving xanax again then. She probably needs to be seen in the future by psychiatry for her anxiety.  Unfortunately I am out of the office and we cannot diagnose this over the phone. They need to go to the emergency room for evaluation, I recommend she be evaluated in the ED as it is difficult to tell if the xanax caused her lethargy but it has been a long time since she took it, sounds like over 12 hours. I recommend ED. Thanks.

## 2015-11-20 NOTE — Telephone Encounter (Signed)
Dois DavenportSandra with [facility] is calling regarding the patient. She is with the patient now and she has a lot of anxieties resulting from the headaches, memory issues. When she was at [facility]  Dr. Jarold MottoPatterson gave her Xanax 0.5 prn for anxiety. Is it ok for the patient to continue to take this medication and and if so can a new Rx be called to [facility] on [address]. Please call the patient's son Reita ClicheBobby at [phone] to discuss.

## 2015-11-20 NOTE — Telephone Encounter (Signed)
She can have xanax prn but I do not want her taking it daily, this medication can cause sedation and increased risk of falls, please ask the work in doctor to sign prescription as I am in the hospital rounding on stroke. She can have 0.5mg  bid prn dispense 15 a month with 2 refills please thank you.

## 2015-11-20 NOTE — Telephone Encounter (Signed)
I called and spoke to ChurchillBobby, son, per [facility]. He immediately handed the phone to [place], [place] [facility]. Dois DavenportSandra reports to the me that last night pt was "out of control", screaming and very agitated, and had a headache. Reita ClicheBobby, pt's son, was at "his wit's end", and gave her 0.25mg  of xanax, which helped to calm her down. However, this morning, pt is lethargic, which is NOT the normal behavior for this pt. Dois DavenportSandra says "She will wake up to talk to me, but that's it." She is wondering if the xanax could still be in the pt's system and causing the lethargy? Dois DavenportSandra also thinks that perhaps the pt is dehydrated, so she is encouraging fluids with the pt. Dois DavenportSandra reports that Reita ClicheBobby still has 14 xanax 0.5mg  pills on hand if they need them, but they want to know if it is still ok to continue xanax or does Dr. Lucia GaskinsAhern recommend something else?  Of note, BP is 110/60, HR of 55 (Dois DavenportSandra reports that pt's HR is usually between 55 and 65, so 55 is normal for her), O2 is 95% on room air.  I have not sent the xanax RX to the work in doctor, since family and Dois DavenportSandra are concerned that xanax may be causing the lethargy and since Reita ClicheBobby has 14 pills on hand if needed.

## 2015-11-20 NOTE — Telephone Encounter (Signed)
I called pt's son, Colleen Huang, per [facility]. I advised him that Dr. Lucia GaskinsAhern does not recommend giving the pt xanax again, and pt should go to the ED to be evaluated for lethargy, since this is not her normal, and it was over 12 hours since she took the xanax. At this point in the conversation, pt's son interrupted me, and states "Mom was just in the ER Tuesday. We are not going back there." I again recommended since this lethargy is unusual for the pt and we don't know what is causing it, pt should go to the ED. Pt's son raised his voice and stated "We are not going to the ER, and I will keep giving her xanax. It is the only thing that helps. You have no idea what I'm going through. You are just relaying a message from the doctor who has no idea what is going on since she can't see my mom. You have no idea what I'm going through and dealing with. My mom only gets better when I give her the xanax. This conversation is pointless because you don't know what you are talking about." I tried to ask pt's son to at least see psychiatry in the future to help with the anxiety, but before I could say anything else, pt's son states, " This conversation is pointless. I don't get in the middle of these things." I asked pt's son if Dois DavenportSandra, the nurse [facility] was still there. He says "No, she had other appointments, and she has gone. I advised son that I will call [facility] and try to reach Dois DavenportSandra again to at least update her on Dr. Trevor MaceAhern's concerns. Pt's son states " You go right ahead and do that." At this point, the call was disconnected, and I'm not sure if the pt's son hung up on me, or if the call was dropped.  I called [facility] and was able to obtain Colleen Huang's phone number [phone].) I spoke to her and advised her that Dr. Lucia GaskinsAhern recommends that the pt go to the ED to be evaluated for her lethargy and that she doesn't recommend giving the pt any more xanax. I also advised Dois DavenportSandra that pt's son was not receptive to Dr. Trevor MaceAhern's  advice, and that he said that he will not take her to the ED since she just went Tuesday, and that he will continue giving the pt xanax. Dois DavenportSandra agrees that pt should go to ED, and actually told me that she recommended this to the son already, and he was receptive to this. Dois DavenportSandra states "I am surprised that he said he won't take her to the ED." Dois DavenportSandra also states "He is at the end of his rope right now." Dois DavenportSandra said that she will call and hopefully convince the son to have the pt evaluated at the ED.

## 2015-11-21 NOTE — Telephone Encounter (Signed)
I tried calling patient and son this evening. I received a voicemail. I left a message. Patient reports anxiety, outbursts and headaches. She reported a significant headache last time she was in the office but she was very comfortable. I wonder if these are primarily behavioral, son thinks so too. I think we shoyuld try patient on an SSRI and she should be seen by psychiatry. Repeat CT o fthe head have been negative. We have tried several agents for her headaches, at last appointment they declined increasing or continuing Depakote which is actually goo d for headaches as well as behavioral issues. Will talk to son about it. Appears there is some caregiver burnout going on, will discuss with him as well.

## 2015-11-22 NOTE — Telephone Encounter (Signed)
I called again. Received a voicemail. Left message to call office if he still wanted to discuss patient's behavioral outbursts. thanks

## 2015-11-30 ENCOUNTER — Telehealth: Payer: Self-pay | Admitting: Neurology

## 2015-11-30 NOTE — Telephone Encounter (Signed)
Spoke to son. Gave Dr Donell BeersPlovsky number to have him call to schedule appt. He is also going to speak to rest of family as well.

## 2015-11-30 NOTE — Telephone Encounter (Signed)
Tried calling son back. LVM. If he calls, please ask when she stopped donepezil.

## 2015-11-30 NOTE — Telephone Encounter (Signed)
[name] Jorja Loaim is calling and states that his brother took the pt off donepezil 10 mg because of it giving her diarrhea. They have been giving her Tylenol and Xanax at night to keep her calm.  He is asking if she should be back on another anxiety medication.  Please call.

## 2015-11-30 NOTE — Telephone Encounter (Signed)
Patient needs to see psychiatry, I c

## 2015-11-30 NOTE — Telephone Encounter (Signed)
Dr Ahern- please advise 

## 2015-11-30 NOTE — Telephone Encounter (Signed)
Son returned call, states patient stopped Donepezil about 2 or 3 weeks ago.

## 2015-11-30 NOTE — Telephone Encounter (Signed)
I have recommended psychiatry multiple times for her anxiety, recommended Archer AsaGerald Plovsky, discussed again

## 2015-12-01 ENCOUNTER — Telehealth: Payer: Self-pay | Admitting: Neurology

## 2015-12-01 NOTE — Telephone Encounter (Signed)
Dr Lucia GaskinsAhern- [place] [facility] Called son back. Advised per Dr Lucia GaskinsAhern that she cannot prescribe xanax for pt. She needs to see psychiatry as previously discussed. He stated they called and cannot get an appt for at least 7 weeks. I advised him to call PCP office for request. He verbalized understanding and stated he had not yet called PCP yet. He also stated they are looking at transferring to geriatric doctor for her. Advised he she does switch, to let us know and I can update our information in our system. He verbalized understanding.

## 2015-12-01 NOTE — Telephone Encounter (Signed)
Pt's son, Rosanne AshingJim , called to ask for rx of xanax for pt. He said it helps with her headaches. Please call [phone]

## 2015-12-02 DIAGNOSIS — N390 Urinary tract infection, site not specified: Secondary | ICD-10-CM | POA: Diagnosis not present

## 2015-12-02 DIAGNOSIS — F0390 Unspecified dementia without behavioral disturbance: Secondary | ICD-10-CM | POA: Diagnosis not present

## 2015-12-02 DIAGNOSIS — E559 Vitamin D deficiency, unspecified: Secondary | ICD-10-CM | POA: Diagnosis not present

## 2015-12-02 DIAGNOSIS — E119 Type 2 diabetes mellitus without complications: Secondary | ICD-10-CM | POA: Diagnosis not present

## 2015-12-02 DIAGNOSIS — I10 Essential (primary) hypertension: Secondary | ICD-10-CM | POA: Diagnosis not present

## 2015-12-02 DIAGNOSIS — E785 Hyperlipidemia, unspecified: Secondary | ICD-10-CM | POA: Diagnosis not present

## 2015-12-02 DIAGNOSIS — F419 Anxiety disorder, unspecified: Secondary | ICD-10-CM | POA: Diagnosis not present

## 2015-12-02 DIAGNOSIS — R51 Headache: Secondary | ICD-10-CM | POA: Diagnosis not present

## 2015-12-02 DIAGNOSIS — H409 Unspecified glaucoma: Secondary | ICD-10-CM | POA: Diagnosis not present

## 2015-12-02 DIAGNOSIS — E039 Hypothyroidism, unspecified: Secondary | ICD-10-CM | POA: Diagnosis not present

## 2015-12-24 DIAGNOSIS — E559 Vitamin D deficiency, unspecified: Secondary | ICD-10-CM | POA: Diagnosis not present

## 2015-12-24 DIAGNOSIS — E119 Type 2 diabetes mellitus without complications: Secondary | ICD-10-CM | POA: Diagnosis not present

## 2015-12-24 DIAGNOSIS — F0390 Unspecified dementia without behavioral disturbance: Secondary | ICD-10-CM | POA: Diagnosis not present

## 2015-12-24 DIAGNOSIS — E785 Hyperlipidemia, unspecified: Secondary | ICD-10-CM | POA: Diagnosis not present

## 2015-12-24 DIAGNOSIS — I10 Essential (primary) hypertension: Secondary | ICD-10-CM | POA: Diagnosis not present

## 2015-12-24 DIAGNOSIS — F419 Anxiety disorder, unspecified: Secondary | ICD-10-CM | POA: Diagnosis not present

## 2015-12-24 DIAGNOSIS — E039 Hypothyroidism, unspecified: Secondary | ICD-10-CM | POA: Diagnosis not present

## 2015-12-24 DIAGNOSIS — H409 Unspecified glaucoma: Secondary | ICD-10-CM | POA: Diagnosis not present

## 2015-12-24 DIAGNOSIS — L299 Pruritus, unspecified: Secondary | ICD-10-CM | POA: Diagnosis not present

## 2016-01-11 DIAGNOSIS — F411 Generalized anxiety disorder: Secondary | ICD-10-CM | POA: Diagnosis not present

## 2016-01-11 DIAGNOSIS — I10 Essential (primary) hypertension: Secondary | ICD-10-CM | POA: Diagnosis not present

## 2016-01-11 DIAGNOSIS — R413 Other amnesia: Secondary | ICD-10-CM | POA: Diagnosis not present

## 2016-01-11 DIAGNOSIS — E039 Hypothyroidism, unspecified: Secondary | ICD-10-CM | POA: Diagnosis not present

## 2016-01-11 DIAGNOSIS — E7800 Pure hypercholesterolemia, unspecified: Secondary | ICD-10-CM | POA: Diagnosis not present

## 2016-01-11 DIAGNOSIS — R51 Headache: Secondary | ICD-10-CM | POA: Diagnosis not present

## 2016-01-11 DIAGNOSIS — E559 Vitamin D deficiency, unspecified: Secondary | ICD-10-CM | POA: Diagnosis not present

## 2016-01-11 DIAGNOSIS — E119 Type 2 diabetes mellitus without complications: Secondary | ICD-10-CM | POA: Diagnosis not present

## 2016-01-11 DIAGNOSIS — H409 Unspecified glaucoma: Secondary | ICD-10-CM | POA: Diagnosis not present

## 2016-01-20 DIAGNOSIS — F4322 Adjustment disorder with anxiety: Secondary | ICD-10-CM | POA: Diagnosis not present

## 2016-02-16 DIAGNOSIS — E559 Vitamin D deficiency, unspecified: Secondary | ICD-10-CM | POA: Diagnosis not present

## 2016-02-16 DIAGNOSIS — E039 Hypothyroidism, unspecified: Secondary | ICD-10-CM | POA: Diagnosis not present

## 2016-02-16 DIAGNOSIS — H409 Unspecified glaucoma: Secondary | ICD-10-CM | POA: Diagnosis not present

## 2016-02-16 DIAGNOSIS — F0390 Unspecified dementia without behavioral disturbance: Secondary | ICD-10-CM | POA: Diagnosis not present

## 2016-02-16 DIAGNOSIS — F419 Anxiety disorder, unspecified: Secondary | ICD-10-CM | POA: Diagnosis not present

## 2016-02-16 DIAGNOSIS — E119 Type 2 diabetes mellitus without complications: Secondary | ICD-10-CM | POA: Diagnosis not present

## 2016-02-16 DIAGNOSIS — I10 Essential (primary) hypertension: Secondary | ICD-10-CM | POA: Diagnosis not present

## 2016-02-16 DIAGNOSIS — E785 Hyperlipidemia, unspecified: Secondary | ICD-10-CM | POA: Diagnosis not present

## 2016-03-21 DIAGNOSIS — E039 Hypothyroidism, unspecified: Secondary | ICD-10-CM | POA: Diagnosis not present

## 2016-03-21 DIAGNOSIS — E7800 Pure hypercholesterolemia, unspecified: Secondary | ICD-10-CM | POA: Diagnosis not present

## 2016-03-21 DIAGNOSIS — E559 Vitamin D deficiency, unspecified: Secondary | ICD-10-CM | POA: Diagnosis not present

## 2016-03-22 DIAGNOSIS — F4322 Adjustment disorder with anxiety: Secondary | ICD-10-CM | POA: Diagnosis not present

## 2016-03-28 DIAGNOSIS — E7800 Pure hypercholesterolemia, unspecified: Secondary | ICD-10-CM | POA: Diagnosis not present

## 2016-03-28 DIAGNOSIS — E559 Vitamin D deficiency, unspecified: Secondary | ICD-10-CM | POA: Diagnosis not present

## 2016-03-28 DIAGNOSIS — Z23 Encounter for immunization: Secondary | ICD-10-CM | POA: Diagnosis not present

## 2016-03-28 DIAGNOSIS — E039 Hypothyroidism, unspecified: Secondary | ICD-10-CM | POA: Diagnosis not present

## 2016-03-28 DIAGNOSIS — M810 Age-related osteoporosis without current pathological fracture: Secondary | ICD-10-CM | POA: Diagnosis not present

## 2016-03-30 ENCOUNTER — Ambulatory Visit: Payer: Medicare Other | Admitting: Neurology

## 2016-04-06 ENCOUNTER — Ambulatory Visit (INDEPENDENT_AMBULATORY_CARE_PROVIDER_SITE_OTHER): Payer: Medicare Other | Admitting: Neurology

## 2016-04-06 DIAGNOSIS — G309 Alzheimer's disease, unspecified: Principal | ICD-10-CM

## 2016-04-06 DIAGNOSIS — G308 Other Alzheimer's disease: Secondary | ICD-10-CM

## 2016-04-06 DIAGNOSIS — F0281 Dementia in other diseases classified elsewhere with behavioral disturbance: Secondary | ICD-10-CM | POA: Diagnosis not present

## 2016-04-06 MED ORDER — MEMANTINE HCL 5 MG PO TABS: ORAL_TABLET | ORAL | 11 refills | Status: DC

## 2016-04-06 NOTE — Patient Instructions (Addendum)
Remember to drink plenty of fluid, eat healthy meals and do not skip any meals. Try to eat protein with a every meal and eat a healthy snack such as fruit or nuts in between meals. Try to keep a regular sleep-wake schedule and try to exercise daily, particularly in the form of walking, 20-30 minutes a day, if you can.   As far as your medications are concerned, I would like to suggest; Namenda start 5mg  daily and slowly increase to goal of 10mg  twice daily  I would like to see you back in 6 months, sooner if we need to. Please call us with any interim questions, concerns, problems, updates or refill requests.   For adult day[facility] [address] [phone]  The 36-hour day - wonderful book on being a caretaker of someone with dementia  [facility] [employer] [address]  [place] [address] 10:00 AM Ladene Artist- Noon, 2nd Thursday of each month Please RSVP with Christianna Ladona Ridgelaylor, [facility] Careg[facility] Support Coordinator [phone] or [email or web address]  Our phone number is [phone]. We also have an after hours call service for urgent matters and there is a physician on-call for urgent questions. For any emergencies you know to call 911 or go to the nearest emergency room  Memantine Tablets What is this medicine? MEMANTINE (MEM an teen) is used to treat dementia caused by Alzheimer's disease. This medicine may be used for other purposes; ask your health care provider or pharmacist if you have questions. What should I tell my health care provider before I take this medicine? They need to know if you have any of these conditions: -difficulty passing urine -kidney disease -liver disease -seizures -an unusual or allergic reaction to memantine, other medicines, foods, dyes, or preservatives -pregnant or trying to get pregnant -breast-feeding How should I use this medicine? Take this medicine by mouth with a glass of water. Follow  the directions on the prescription label. You may take this medicine with or without food. Take your doses at regular intervals. Do not take your medicine more often than directed. Continue to take your medicine even if you feel better. Do not stop taking except on the advice of your doctor or health care professional. Talk to your pediatrician regarding the use of this medicine in children. Special care may be needed. Overdosage: If you think you have taken too much of this medicine contact a poison control center or emergency room at once. NOTE: This medicine is only for you. Do not share this medicine with others. What if I miss a dose? If you miss a dose, take it as soon as you can. If it is almost time for your next dose, take only that dose. Do not take double or extra doses. If you do not take your medicine for several days, contact your health care provider. Your dose may need to be changed. What may interact with this medicine? -acetazolamide -amantadine -cimetidine -dextromethorphan -dofetilide -hydrochlorothiazide -ketamine -metformin -methazolamide -quinidine -ranitidine -sodium bicarbonate -triamterene This list may not describe all possible interactions. Give your health care provider a list of all the medicines, herbs, non-prescription drugs, or dietary supplements you use. Also tell them if you smoke, drink alcohol, or use illegal drugs. Some items may interact with your medicine. What should I watch for while using this medicine? Visit your doctor or health care professional for regular checks on your progress. Check with your doctor or health care professional if there is no improvement in your  symptoms or if they get worse. You may get drowsy or dizzy. Do not drive, use machinery, or do anything that needs mental alertness until you know how this drug affects you. Do not stand or sit up quickly, especially if you are an older patient. This reduces the risk of dizzy or  fainting spells. Alcohol can make you more drowsy and dizzy. Avoid alcoholic drinks. What side effects may I notice from receiving this medicine? Side effects that you should report to your doctor or health care professional as soon as possible: -allergic reactions like skin rash, itching or hives, swelling of the face, lips, or tongue -agitation or a feeling of restlessness -depressed mood -dizziness -hallucinations -redness, blistering, peeling or loosening of the skin, including inside the mouth -seizures -vomiting Side effects that usually do not require medical attention (report to your doctor or health care professional if they continue or are bothersome): -constipation -diarrhea -headache -nausea -trouble sleeping This list may not describe all possible side effects. Call your doctor for medical advice about side effects. You may report side effects to FDA at 1-800-FDA-1088. Where should I keep my medicine? Keep out of the reach of children. Store at room temperature between 15 degrees and 30 degrees C (59 degrees and 86 degrees F). Throw away any unused medicine after the expiration date. NOTE: This sheet is a summary. It may not cover all possible information. If you have questions about this medicine, talk to your doctor, pharmacist, or health care provider.    2016, Elsevier/Gold Standard. (2013-03-18 14:10:42)
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
## 2016-04-07 DIAGNOSIS — F02818 Dementia in other diseases classified elsewhere, unspecified severity, with other behavioral disturbance: Secondary | ICD-10-CM | POA: Insufficient documentation

## 2016-04-07 DIAGNOSIS — G309 Alzheimer's disease, unspecified: Secondary | ICD-10-CM

## 2016-04-07 DIAGNOSIS — F0281 Dementia in other diseases classified elsewhere with behavioral disturbance: Secondary | ICD-10-CM | POA: Insufficient documentation

## 2016-04-25 ENCOUNTER — Encounter (HOSPITAL_COMMUNITY): Payer: Self-pay | Admitting: Emergency Medicine

## 2016-04-25 ENCOUNTER — Emergency Department (HOSPITAL_COMMUNITY)
Admission: EM | Admit: 2016-04-25 | Discharge: 2016-04-25 | Disposition: A | Discharge status: Home / Self Care | Payer: Medicare Other | Attending: Emergency Medicine | Admitting: Emergency Medicine

## 2016-04-25 ENCOUNTER — Emergency Department (HOSPITAL_COMMUNITY): Payer: Medicare Other

## 2016-04-25 DIAGNOSIS — E038 Other specified hypothyroidism: Secondary | ICD-10-CM | POA: Diagnosis not present

## 2016-04-25 DIAGNOSIS — R079 Chest pain, unspecified: Secondary | ICD-10-CM | POA: Diagnosis not present

## 2016-04-25 DIAGNOSIS — R519 Headache, unspecified: Secondary | ICD-10-CM

## 2016-04-25 DIAGNOSIS — I10 Essential (primary) hypertension: Secondary | ICD-10-CM | POA: Diagnosis not present

## 2016-04-25 DIAGNOSIS — G309 Alzheimer's disease, unspecified: Secondary | ICD-10-CM | POA: Diagnosis not present

## 2016-04-25 DIAGNOSIS — R030 Elevated blood-pressure reading, without diagnosis of hypertension: Secondary | ICD-10-CM | POA: Diagnosis not present

## 2016-04-25 DIAGNOSIS — F410 Panic disorder [episodic paroxysmal anxiety] without agoraphobia: Secondary | ICD-10-CM | POA: Diagnosis not present

## 2016-04-25 DIAGNOSIS — F0281 Dementia in other diseases classified elsewhere with behavioral disturbance: Secondary | ICD-10-CM | POA: Insufficient documentation

## 2016-04-25 DIAGNOSIS — E119 Type 2 diabetes mellitus without complications: Secondary | ICD-10-CM | POA: Insufficient documentation

## 2016-04-25 DIAGNOSIS — Z79899 Other long term (current) drug therapy: Secondary | ICD-10-CM | POA: Insufficient documentation

## 2016-04-25 DIAGNOSIS — R51 Headache: Secondary | ICD-10-CM | POA: Diagnosis not present

## 2016-04-25 LAB — CBC WITH DIFFERENTIAL/PLATELET
BASOS ABS: 0 10*3/uL (ref 0.0–0.1)
Basophils Relative: 0 %
EOS PCT: 1 %
Eosinophils Absolute: 0.1 10*3/uL (ref 0.0–0.7)
HEMATOCRIT: 39.2 % (ref 36.0–46.0)
Hemoglobin: 13.1 g/dL (ref 12.0–15.0)
LYMPHS ABS: 2.1 10*3/uL (ref 0.7–4.0)
LYMPHS PCT: 34 %
MCH: 29.9 pg (ref 26.0–34.0)
MCHC: 33.4 g/dL (ref 30.0–36.0)
MCV: 89.5 fL (ref 78.0–100.0)
MONO ABS: 0.5 10*3/uL (ref 0.1–1.0)
Monocytes Relative: 9 %
NEUTROS ABS: 3.5 10*3/uL (ref 1.7–7.7)
Neutrophils Relative %: 56 %
PLATELETS: 292 10*3/uL (ref 150–400)
RBC: 4.38 MIL/uL (ref 3.87–5.11)
RDW: 12.6 % (ref 11.5–15.5)
WBC: 6.3 10*3/uL (ref 4.0–10.5)

## 2016-04-25 LAB — COMPREHENSIVE METABOLIC PANEL
ALBUMIN: 4.3 g/dL (ref 3.5–5.0)
ALT: 19 U/L (ref 14–54)
ANION GAP: 7 (ref 5–15)
AST: 22 U/L (ref 15–41)
Alkaline Phosphatase: 88 U/L (ref 38–126)
BUN: 22 mg/dL — AB (ref 6–20)
CHLORIDE: 105 mmol/L (ref 101–111)
CO2: 29 mmol/L (ref 22–32)
Calcium: 9.9 mg/dL (ref 8.9–10.3)
Creatinine, Ser: 0.79 mg/dL (ref 0.44–1.00)
GFR calc Af Amer: >60 mL/min (ref >60)
GFR calc non Af Amer: >60 mL/min (ref >60)
GLUCOSE: 101 mg/dL — AB (ref 65–99)
POTASSIUM: 4.1 mmol/L (ref 3.5–5.1)
SODIUM: 141 mmol/L (ref 135–145)
Total Bilirubin: 1 mg/dL (ref 0.3–1.2)
Total Protein: 7.5 g/dL (ref 6.5–8.1)

## 2016-04-25 LAB — URINALYSIS, ROUTINE W REFLEX MICROSCOPIC
Bilirubin Urine: NEGATIVE
GLUCOSE, UA: NEGATIVE mg/dL
HGB URINE DIPSTICK: NEGATIVE
Ketones, ur: NEGATIVE mg/dL
LEUKOCYTES UA: NEGATIVE
Nitrite: NEGATIVE
PH: 8 (ref 5.0–8.0)
PROTEIN: NEGATIVE mg/dL
Specific Gravity, Urine: 1.008 (ref 1.005–1.030)

## 2016-04-25 LAB — I-STAT TROPONIN, ED: Troponin i, poc: 0.01 ng/mL (ref 0.00–0.08)

## 2016-04-25 LAB — LIPASE, BLOOD: Lipase: 31 U/L (ref 11–51)

## 2016-04-25 MED ORDER — IBUPROFEN 200 MG PO TABS
400.0000 mg | ORAL_TABLET | Freq: Once | ORAL | Status: AC
  Administered 2016-04-25: 400 mg via ORAL
  Filled 2016-04-25: qty 2

## 2016-04-25 NOTE — Discharge Instructions (Signed)
You have been seen today for a headache, panic attack, and chest pain. Your imaging and lab tests showed no acute abnormalities. Follow up with your PCP and neurologist as soon as possible for continued issues. Return to ED should any other concerns arise.

## 2016-04-25 NOTE — ED Notes (Signed)
Gave pt sandwich and apple juice 

## 2016-04-25 NOTE — ED Notes (Signed)
Pt ambulated well to the restroom  

## 2016-04-25 NOTE — ED Notes (Signed)
Pt ambulated to bathroom with 1 person assist. Steady Gait. NAD noted.

## 2016-04-25 NOTE — ED Notes (Signed)
Patient transported to X-ray 

## 2016-04-25 NOTE — ED Provider Notes (Signed)
[facility]-EMERGENCY DEPT Provider Note   CSN: [id number] Arrival date & time: 04/25/16  1259     History   Chief Complaint Chief Complaint  Patient presents with  . Anxiety    HPI Colleen Huang is a 82 y.o. female.  HPI   Colleen Huang is a 82 y.o. female, with a history of Dementia, DM, recurrent headaches, and pancreatitis, presenting to the ED with complaint of what she refers to as a panic attack. Patient states that she began to have a severe frontal headache while at rest. Patient then became anxious and started to have chest pain.   [name], Inella Kuwahara, is at the bedside and states that this pattern of complaint is consistent with the patient's presentation during one of her known headache/panic attack combinations. Rosanne Ashing also has a concern that the patient may have a UTI. The reason 911 was called today and this is a known issue is that the patient lives at home alone and has a caregiver who comes to help her. The caregiver was not aware of the patient's history of these episodes and called EMS. Has had these types of headaches before, has been evaluated by a neurologist, Dr. Lucia Gaskins. Dr. Lucia Gaskins has told the family that these episodes are likely dementia headaches causing panic attacks. Patient takes 0.5 mg lorazepam twice daily for this issue. Patient has been out of her lorazepam for 24 hours. Patient states that she began to feel much better even before her arrival at the ED with resolution of the severe headache as well as the chest pain. Currently complains of a 2 out of 10, generalized headache, she states is consistent with how she feels when she is hungry. Patient denies fever/chills, neurologic deficits, falls/trauma, current dizziness, or any other complaints.    Past Medical History:  Diagnosis Date  . Anxiety    anxiety with episodes of angry outbursts" feels as being treated as a child"  . Diabetes mellitus without complication (HCC)    past history  in Epic note from Neurlogy visit  . Headache    recent neurology visit to evaluate headaches.  . Hearing loss    wears hearing aids  . Memory difficulties    "repeats self alot"- has increasing levels of memory issues  . Pancreatitis 04/2015   [facility]    Patient Active Problem List   Diagnosis Date Noted  . Alzheimer's dementia with behavioral disturbance 04/07/2016  . Acute pancreatitis 08/31/2015  . Chest pain 08/31/2015  . Headache 06/18/2015  . Anxiety state 05/19/2015  . Pain in the chest   . Abdominal pain, epigastric 05/18/2015  . Chest pain at rest 05/18/2015  . Dyslipidemia 05/18/2015  . Benign essential HTN 05/18/2015  . Other specified hypothyroidism 05/18/2015    Past Surgical History:  Procedure Laterality Date  . APPENDECTOMY  1935  . CHOLECYSTECTOMY    . COLONOSCOPY  2005, 2009  . EUS N/A 09/18/2015   Procedure: UPPER ENDOSCOPIC ULTRASOUND (EUS) LINEAR;  Surgeon: Jeani Hawking, MD;  Location: [facility] ENDOSCOPY;  Service: Endoscopy;  Laterality: N/A;  . FLEXIBLE SIGMOIDOSCOPY  2005  . GALLBLADDER SURGERY  1997  . PARATHYROIDECTOMY / EXPLORATION OF PARATHYROIDS  2003  . RECTAL POLYPECTOMY  2005  . TONSILLECTOMY  1949    OB History    No data available       Home Medications    Prior to Admission medications   Medication Sig Start Date End Date  Taking? Authorizing Provider  amLODipine (NORVASC) 10 MG tablet Take 10 mg by mouth daily. 04/09/16  Yes Historical Provider, MD  Calcium-Vitamin D 600-200 MG-UNIT tablet Take 1 tablet by mouth daily with breakfast.   Yes Historical Provider, MD  DOCOSAHEXAENOIC ACID PO Take 1 capsule by mouth daily.   Yes Historical Provider, MD  latanoprost (XALATAN) 0.005 % ophthalmic solution Place 1 drop into both eyes at bedtime.    Yes Historical Provider, MD  levothyroxine (SYNTHROID, LEVOTHROID) 50 MCG tablet Take 2.5 tablets (125 mcg total) by mouth daily before breakfast. Patient  taking differently: Take 75 mcg by mouth daily before breakfast.  09/29/15  Yes Anson FretAntonia B Ahern, MD  LORazepam (ATIVAN) 0.5 MG tablet Take 0.5 mg by mouth 2 (two) times daily as needed for anxiety. 03/24/16  Yes Historical Provider, MD  memantine (NAMENDA) 5 MG tablet Start with one pill daily for 2-4 weeks. Increase to one pill twice daily as needed. 04/06/16  Yes Anson FretAntonia B Ahern, MD  Omega-3 Fatty Acids (FISH OIL PO) Take 1 capsule by mouth 2 (two) times daily.   Yes Historical Provider, MD    Family History Family History  Problem Relation Age of Onset  . Dementia Brother     Social History Social History  Substance Use Topics  . Smoking status: Never Smoker  . Smokeless tobacco: Not on file  . Alcohol use No     Allergies   Codeine; Hydrocodone; Penicillins; Percocet [oxycodone-acetaminophen]; and Sulfa antibiotics   Review of Systems Review of Systems  Constitutional: Negative for chills, diaphoresis and fever.  Respiratory: Negative for cough and shortness of breath.   Cardiovascular: Positive for chest pain.  Gastrointestinal: Negative for abdominal pain, nausea and vomiting.  Neurological: Positive for dizziness (resolved) and headaches. Negative for weakness and numbness.  All other systems reviewed and are negative.    Physical Exam Updated Vital Signs BP 157/74 (BP Location: Left Arm)   Pulse 91   Temp 98.9 F (37.2 C) (Oral)   Resp 18   SpO2 100%   Physical Exam  Constitutional: She appears well-developed and well-nourished. No distress.  HENT:  Head: Normocephalic and atraumatic.  Eyes: Conjunctivae and EOM are normal. Pupils are equal, round, and reactive to light.  Neck: Normal range of motion. Neck supple.  Cardiovascular: Normal rate, regular rhythm, normal heart sounds and intact distal pulses.   Pulmonary/Chest: Effort normal and breath sounds normal. No respiratory distress.  Abdominal: Soft. There is no tenderness. There is no guarding.    Musculoskeletal: She exhibits no edema.  Normal motor function intact in all extremities and spine. No midline spinal tenderness.   Lymphadenopathy:    She has no cervical adenopathy.  Neurological: She is alert.  Oriented to her normal level per patient's son, Rosanne AshingJim. No sensory deficits. Strength 5/5 in all extremities. No gait disturbance. Coordination intact. Cranial nerves III-XII grossly intact. No facial droop.   Skin: Skin is warm and dry. She is not diaphoretic.  Psychiatric: She has a normal mood and affect. Her behavior is normal.  Nursing note and vitals reviewed.    ED Treatments / Results  Labs (all labs ordered are listed, but only abnormal results are displayed) Labs Reviewed  COMPREHENSIVE METABOLIC PANEL - Abnormal; Notable for the following:       Result Value   Glucose, Bld 101 (*)    BUN 22 (*)    All other components within normal limits  CBC WITH DIFFERENTIAL/PLATELET  URINALYSIS, ROUTINE W REFLEX  [facility]CROSCOPIC (NOT [facility])  LIPASE, BLOOD  I-STAT TROPOININ, ED    EKG  EKG Interpretation  Date/Time:  Monday April 25 2016 15:01:38 EST Ventricular Rate:  78 PR Interval:    QRS Duration: 96 QT Interval:  411 QTC Calculation: 469 R Axis:   -31 Text Interpretation:  Sinus rhythm Atrial premature complexes Left axis deviation Baseline wander in lead(s) I II aVR No significant change since last tracing Confirmed by [facility] Florida[facility]ARDAMA MD, PEDRO ([id number]) on 04/25/2016 3:22:17 PM       Radiology Dg Chest 2 View  Result Date: 04/25/2016 CLINICAL DATA:  Chest pain. EXAM: CHEST  2 VIEW COMPARISON:  08/31/2015 FINDINGS: The cardiac silhouette is borderline enlarged. Aortic atherosclerosis is again noted. No airspace consolidation, edema, pleural effusion, or pneumothorax is identified. Thoracic spondylosis is noted. IMPRESSION: 1. No evidence of active cardiopulmonary disease. 2. Aortic atherosclerosis. Electronically Signed   By: Sebastian AcheAllen  Grady M.D.   On: 04/25/2016 16:33     Procedures Procedures (including critical care time)  Medications Ordered in ED Medications  ibuprofen (ADVIL,MOTRIN) tablet 400 mg (400 mg Oral Given 04/25/16 1618)     Initial Impression / Assessment and Plan / ED Course  I have reviewed the triage vital signs and the nursing notes.  Pertinent labs & imaging results that were available during my care of the patient were reviewed by me and considered in my medical decision making (see chart for details).  Clinical Course     Patient presents with onset of headache and symptoms of her panic attacks. These complaints resolved prior to the patient's evaluation here in the ED. Patient has no noted neuro deficits. The patient's description of the headache does not have red flag elements. Patient ambulated well without assistance. Patient is completely complaint free shortly after my initial evaluation. Subsequent evaluations yield the patient remains completely free. The pros and cons of waiting for a second troponin were discussed. The patient and her family opted to forego a second troponin. I do not have a high suspicion that this is ACS and therefore do not think it is imperative that the patient wait for serial troponins. Patient follow-up with her neurologist and PCP. Strict return precautions discussed. Patient and patient's family voiced understanding of all instructions and comfortable with discharge.   Vitals:   04/25/16 1309 04/25/16 1620  BP: 157/74 137/62  Pulse: 91 72  Resp: 18 17  Temp: 98.9 F (37.2 C)   TempSrc: Oral   SpO2: 100% 100%     Final Clinical Impressions(s) / ED Diagnoses   Final diagnoses:  Nonintractable episodic headache, unspecified headache type  Panic attack    New Prescriptions New Prescriptions   No medications on file     Anselm PancoastShawn C Spencer Peterkin, Cordelia Poche-C 04/25/16 1656    Nira ConnPedro Eduardo Cardama, MD 04/26/16 [phone]

## 2016-04-25 NOTE — ED Notes (Signed)
Bed: ZO10WA05 Expected date:  Expected time:  Means of arrival:  Comments: EMS- 82yo F, anxiety?

## 2016-04-25 NOTE — ED Triage Notes (Signed)
Patient from home with caregiver initially reported a headache but caregiver told EMS that patient takes medication for anxiety but is out of her meds.  Patient reported to EMS on the way in that she felt better and didn't need to go to the hospital, but then she started c/o chest pain.

## 2016-04-29 ENCOUNTER — Telehealth: Payer: Self-pay | Admitting: Neurology

## 2016-04-29 NOTE — Telephone Encounter (Signed)
Dr Ahern- FYI 

## 2016-04-29 NOTE — Telephone Encounter (Signed)
Called Jim back. He stated she is feeling much better now. She had not had a headache for the last 2-3 months and was doing well. The HA she had was severe. When she went to the hospital to be evaluated, he stated her blood work looked ok and negative for UTI. Advised to make sure she is getting enough sleep and drinking plenty of fluids.   Offered to make appt with NP next week. He declined stating he wants to speak with her first. He knows she is going on Monday to see her boyfriend. He will call back to schedule if needed. Advised they can schedule with phone staff with NP. He verbalized understanding.

## 2016-04-29 NOTE — Telephone Encounter (Signed)
Dr Lucia GaskinsAhern- do you want her to come in and f/u with NP next week? She was last seen 04/06/16

## 2016-04-29 NOTE — Telephone Encounter (Signed)
Pt's son called to advise she is having increased HA's. He advised she's had 2 bad HA's since starting memantine (NAMENDA) 5 MG tablet . He said she went to the hospital on Monday with HA and then her caregiver called him this morning and said she had another bad HA but it is under control now. Please call

## 2016-04-29 NOTE — Telephone Encounter (Signed)
Yes she can see NP thanks

## 2016-05-10 DIAGNOSIS — M5136 Other intervertebral disc degeneration, lumbar region: Secondary | ICD-10-CM | POA: Diagnosis not present

## 2016-05-10 DIAGNOSIS — M5441 Lumbago with sciatica, right side: Secondary | ICD-10-CM | POA: Diagnosis not present

## 2016-05-10 DIAGNOSIS — M9903 Segmental and somatic dysfunction of lumbar region: Secondary | ICD-10-CM | POA: Diagnosis not present

## 2016-05-10 DIAGNOSIS — M47816 Spondylosis without myelopathy or radiculopathy, lumbar region: Secondary | ICD-10-CM | POA: Diagnosis not present

## 2016-05-17 ENCOUNTER — Other Ambulatory Visit: Payer: Self-pay | Admitting: Family Medicine

## 2016-05-17 ENCOUNTER — Ambulatory Visit
Admission: RE | Admit: 2016-05-17 | Discharge: 2016-05-17 | Disposition: A | Discharge status: Home / Self Care | Payer: Medicare Other | Source: Ambulatory Visit | Attending: Family Medicine | Admitting: Family Medicine

## 2016-05-17 DIAGNOSIS — M545 Low back pain: Secondary | ICD-10-CM

## 2016-05-17 DIAGNOSIS — S3992XA Unspecified injury of lower back, initial encounter: Secondary | ICD-10-CM | POA: Diagnosis not present

## 2016-05-23 DIAGNOSIS — F4322 Adjustment disorder with anxiety: Secondary | ICD-10-CM | POA: Diagnosis not present

## 2016-05-30 DIAGNOSIS — E039 Hypothyroidism, unspecified: Secondary | ICD-10-CM | POA: Diagnosis not present

## 2016-05-31 DIAGNOSIS — M6281 Muscle weakness (generalized): Secondary | ICD-10-CM | POA: Diagnosis not present

## 2016-05-31 DIAGNOSIS — M545 Low back pain: Secondary | ICD-10-CM | POA: Diagnosis not present

## 2016-07-13 DIAGNOSIS — E7800 Pure hypercholesterolemia, unspecified: Secondary | ICD-10-CM | POA: Diagnosis not present

## 2016-07-13 DIAGNOSIS — R51 Headache: Secondary | ICD-10-CM | POA: Diagnosis not present

## 2016-07-13 DIAGNOSIS — E039 Hypothyroidism, unspecified: Secondary | ICD-10-CM | POA: Diagnosis not present

## 2016-07-13 DIAGNOSIS — I10 Essential (primary) hypertension: Secondary | ICD-10-CM | POA: Diagnosis not present

## 2016-07-13 DIAGNOSIS — F411 Generalized anxiety disorder: Secondary | ICD-10-CM | POA: Diagnosis not present

## 2016-07-13 DIAGNOSIS — E119 Type 2 diabetes mellitus without complications: Secondary | ICD-10-CM | POA: Diagnosis not present

## 2016-07-13 DIAGNOSIS — H409 Unspecified glaucoma: Secondary | ICD-10-CM | POA: Diagnosis not present

## 2016-07-13 DIAGNOSIS — R413 Other amnesia: Secondary | ICD-10-CM | POA: Diagnosis not present

## 2016-08-03 DIAGNOSIS — E039 Hypothyroidism, unspecified: Secondary | ICD-10-CM | POA: Diagnosis not present

## 2016-10-06 ENCOUNTER — Encounter: Payer: Self-pay | Admitting: Adult Health

## 2016-10-06 ENCOUNTER — Ambulatory Visit (INDEPENDENT_AMBULATORY_CARE_PROVIDER_SITE_OTHER): Payer: Medicare Other | Admitting: Adult Health

## 2016-10-06 VITALS — BP 141/74 | HR 73 | Resp 18 | Ht 65.0 in | Wt 154.5 lb

## 2016-10-06 DIAGNOSIS — G309 Alzheimer's disease, unspecified: Secondary | ICD-10-CM | POA: Diagnosis not present

## 2016-10-06 DIAGNOSIS — R51 Headache: Secondary | ICD-10-CM

## 2016-10-06 DIAGNOSIS — F0280 Dementia in other diseases classified elsewhere without behavioral disturbance: Secondary | ICD-10-CM

## 2016-10-06 DIAGNOSIS — R519 Headache, unspecified: Secondary | ICD-10-CM

## 2016-10-06 NOTE — Patient Instructions (Signed)
Ok to stop [employer]. if headaches improve then remain off. If not can restart If your symptoms worsen or you develop new symptoms please let us know.
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
## 2016-10-20 NOTE — Progress Notes (Signed)
Personally  participated in, made any corrections needed, and agree with history, physical, neuro exam,assessment and plan as stated above.    Antonia Ahern, MD [facility] 

## 2016-11-24 DIAGNOSIS — T161XXA Foreign body in right ear, initial encounter: Secondary | ICD-10-CM | POA: Diagnosis not present

## 2016-11-28 DIAGNOSIS — F4322 Adjustment disorder with anxiety: Secondary | ICD-10-CM | POA: Diagnosis not present

## 2016-12-01 DIAGNOSIS — J209 Acute bronchitis, unspecified: Secondary | ICD-10-CM | POA: Diagnosis not present

## 2016-12-26 DIAGNOSIS — S20212A Contusion of left front wall of thorax, initial encounter: Secondary | ICD-10-CM | POA: Diagnosis not present

## 2017-01-02 DIAGNOSIS — E1165 Type 2 diabetes mellitus with hyperglycemia: Secondary | ICD-10-CM | POA: Diagnosis not present

## 2017-01-02 DIAGNOSIS — E7800 Pure hypercholesterolemia, unspecified: Secondary | ICD-10-CM | POA: Diagnosis not present

## 2017-01-02 DIAGNOSIS — R0789 Other chest pain: Secondary | ICD-10-CM | POA: Diagnosis not present

## 2017-01-02 DIAGNOSIS — I10 Essential (primary) hypertension: Secondary | ICD-10-CM | POA: Diagnosis not present

## 2017-01-21 DIAGNOSIS — G5700 Lesion of sciatic nerve, unspecified lower limb: Secondary | ICD-10-CM | POA: Diagnosis not present

## 2017-01-21 DIAGNOSIS — L309 Dermatitis, unspecified: Secondary | ICD-10-CM | POA: Diagnosis not present

## 2017-01-29 DIAGNOSIS — L299 Pruritus, unspecified: Secondary | ICD-10-CM | POA: Diagnosis not present

## 2017-02-02 IMAGING — DX DG ABDOMEN 1V
1 series · 1 of 1 positions shown · non-contrast
Comparison: None.

CLINICAL DATA: Diarrhea and abdominal pain

EXAM:
ABDOMEN - 1 VIEW

[abdomen kub]
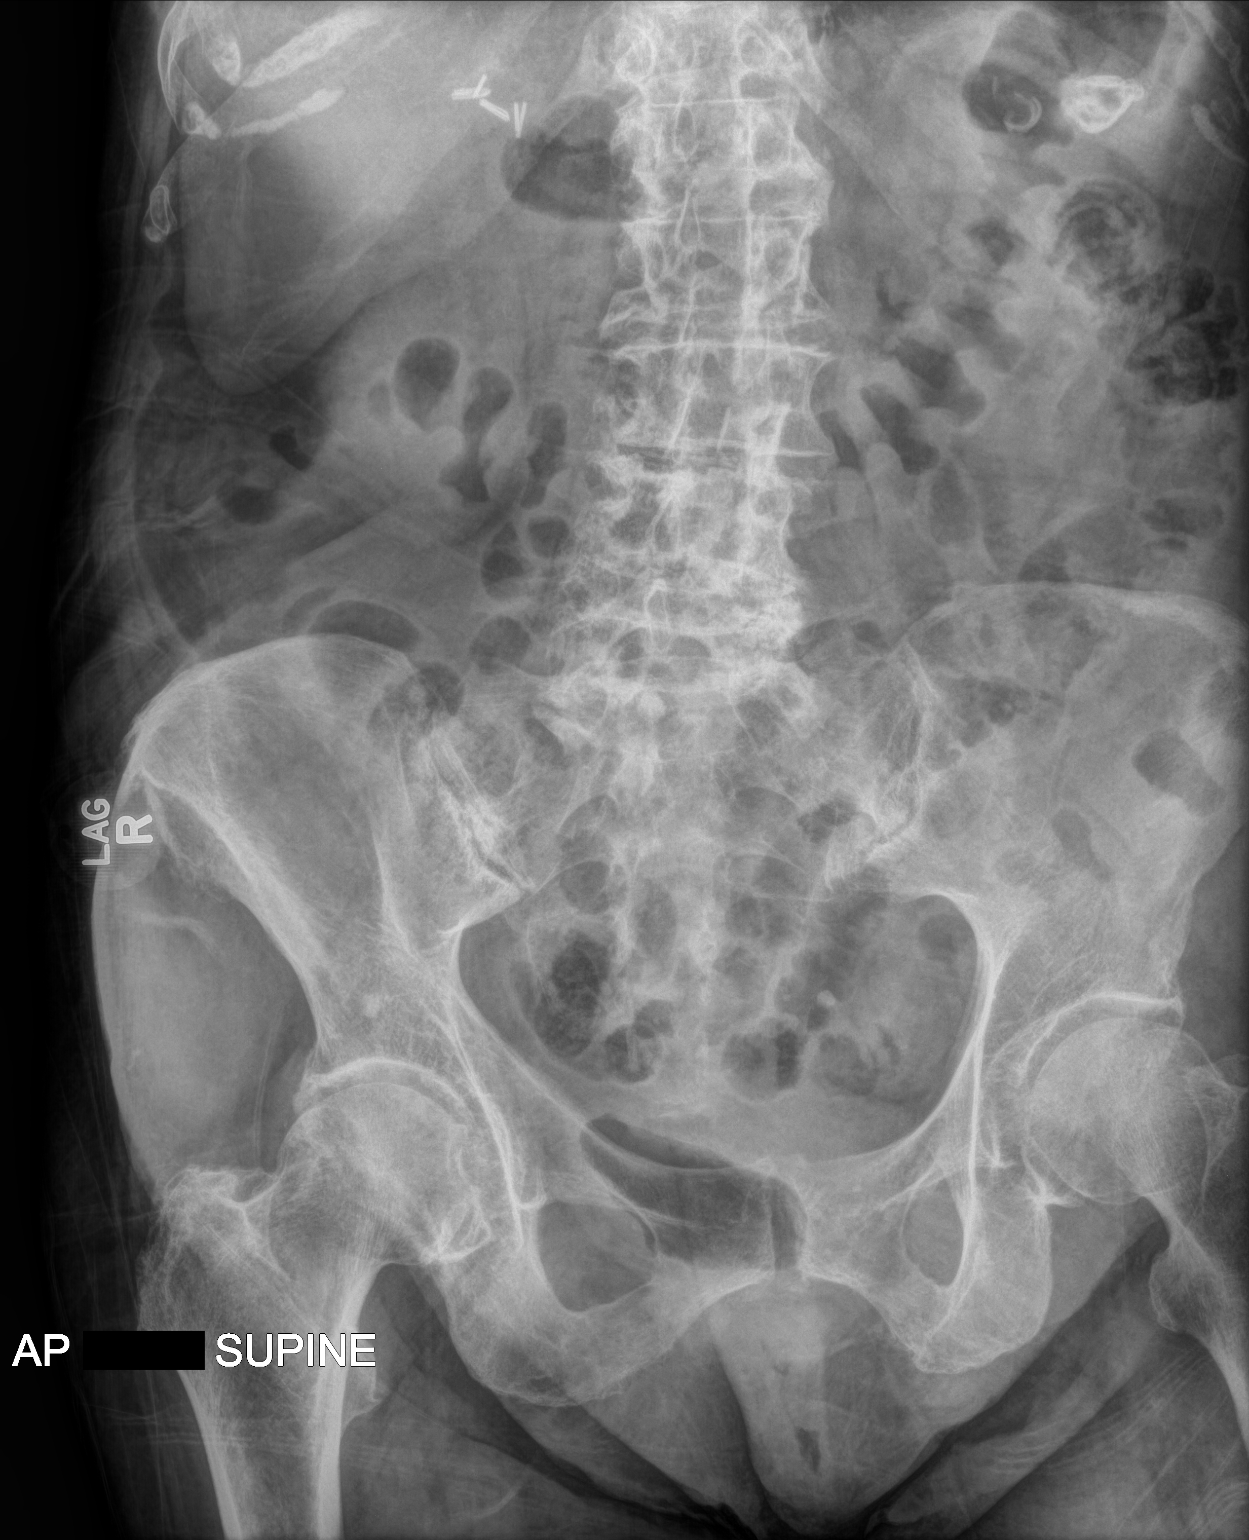

[1 of 1 positions shown; findings below may reference images not displayed]

FINDINGS: Scattered large and small bowel gas is noted. No obstructive changes
are seen. Postoperative changes are noted. No free air is seen.
Degenerative changes of lumbar spine are noted.
IMPRESSION: No acute abnormality noted.

## 2017-03-30 DIAGNOSIS — M810 Age-related osteoporosis without current pathological fracture: Secondary | ICD-10-CM | POA: Diagnosis not present

## 2017-03-30 DIAGNOSIS — E7800 Pure hypercholesterolemia, unspecified: Secondary | ICD-10-CM | POA: Diagnosis not present

## 2017-03-30 DIAGNOSIS — E559 Vitamin D deficiency, unspecified: Secondary | ICD-10-CM | POA: Diagnosis not present

## 2017-03-30 DIAGNOSIS — E039 Hypothyroidism, unspecified: Secondary | ICD-10-CM | POA: Diagnosis not present

## 2017-04-10 ENCOUNTER — Ambulatory Visit (INDEPENDENT_AMBULATORY_CARE_PROVIDER_SITE_OTHER): Payer: Medicare Other | Admitting: Adult Health

## 2017-04-10 ENCOUNTER — Encounter: Payer: Self-pay | Admitting: Adult Health

## 2017-04-10 VITALS — BP 156/62 | HR 60 | Wt 154.0 lb

## 2017-04-10 DIAGNOSIS — F0280 Dementia in other diseases classified elsewhere without behavioral disturbance: Secondary | ICD-10-CM

## 2017-04-10 DIAGNOSIS — G309 Alzheimer's disease, unspecified: Secondary | ICD-10-CM | POA: Diagnosis not present

## 2017-04-10 MED ORDER — MEMANTINE HCL 5 MG PO TABS: ORAL_TABLET | ORAL | 11 refills | Status: DC
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
## 2017-04-10 NOTE — Patient Instructions (Addendum)
Your Plan:  Restart Namenda 5 mg daily for 2-4 weeks then increase to 1 tablet twice a day If your symptoms worsen or you develop new symptoms please let us know.   Thank you for [facility] see [facility]. I hope we have been able to provide you high quality care today.  You may receive a patient satisfaction survey over the next few weeks. We would appreciate your feedback and comments so that we may continue to improve ourselves and the health of our patients.

## 2017-04-12 NOTE — Progress Notes (Signed)
Personally  participated in, made any corrections needed, and agree with history, physical, neuro exam,assessment and plan as stated above.    Celicia Minahan, MD [facility] 

## 2017-04-26 DIAGNOSIS — F4322 Adjustment disorder with anxiety: Secondary | ICD-10-CM | POA: Diagnosis not present

## 2017-06-23 DIAGNOSIS — M1990 Unspecified osteoarthritis, unspecified site: Secondary | ICD-10-CM | POA: Diagnosis not present

## 2017-06-23 DIAGNOSIS — L821 Other seborrheic keratosis: Secondary | ICD-10-CM | POA: Diagnosis not present

## 2017-06-27 ENCOUNTER — Telehealth: Payer: Self-pay | Admitting: Adult Health

## 2017-06-27 NOTE — Telephone Encounter (Signed)
[place] who stated that his mother has two types of headaches. One is low grade for which she takes tylenol or the family distracts her. Tyleonol gives relief. Then she has headaches related to her memory medications or a UTI of which she's not had in a long time. He stated that when she has a bad headache she is holding her head, can barely stand up, and cries. Tylenol doesn't help. He stated this occurs every 2 -3 days.  [facility]. He stated that if it comes down to choosing between memory meds and her headaches he will choose to relieve her headaches. This RN stated will route to NP and call him back. Rosanne AshingJim verbalized understanding, appreciation.

## 2017-06-27 NOTE — Telephone Encounter (Signed)
Pts son called wanting to speak with RN to discuss the medication memantine (NAMENDA) 5 MG tablet. Pts son didn't want to go into detail but he said he doesn't want to keep seeing his mother go through the pain of the medication working and then not. Please call to discuss

## 2017-06-28 NOTE — Telephone Encounter (Signed)
Spoke with son, Rosanne AshingJim and advised him of Megan's reply. He stated he is taking her off namenda. This RN advised he call with update on her headaches. He verbalized understanding, appreciation.

## 2017-06-28 NOTE — Telephone Encounter (Signed)
We have stopped Namenda in the past but it was reported that there was no change in her headaches? If her son feels that her headaches are related to the medication then I would say lets stop the medication and see if the headaches improve. If they do not then we will treat the headaches.

## 2017-07-30 ENCOUNTER — Emergency Department (HOSPITAL_COMMUNITY): Payer: Medicare Other

## 2017-07-30 ENCOUNTER — Emergency Department (HOSPITAL_COMMUNITY)
Admission: EM | Admit: 2017-07-30 | Discharge: 2017-07-30 | Disposition: A | Discharge status: Home / Self Care | Payer: Medicare Other | Attending: Emergency Medicine | Admitting: Emergency Medicine

## 2017-07-30 ENCOUNTER — Other Ambulatory Visit: Payer: Self-pay

## 2017-07-30 ENCOUNTER — Encounter (HOSPITAL_COMMUNITY): Payer: Self-pay | Admitting: *Deleted

## 2017-07-30 DIAGNOSIS — E119 Type 2 diabetes mellitus without complications: Secondary | ICD-10-CM | POA: Insufficient documentation

## 2017-07-30 DIAGNOSIS — F0280 Dementia in other diseases classified elsewhere without behavioral disturbance: Secondary | ICD-10-CM | POA: Diagnosis not present

## 2017-07-30 DIAGNOSIS — R0789 Other chest pain: Secondary | ICD-10-CM | POA: Diagnosis not present

## 2017-07-30 DIAGNOSIS — I10 Essential (primary) hypertension: Secondary | ICD-10-CM | POA: Diagnosis not present

## 2017-07-30 DIAGNOSIS — G309 Alzheimer's disease, unspecified: Secondary | ICD-10-CM | POA: Diagnosis not present

## 2017-07-30 DIAGNOSIS — Z79899 Other long term (current) drug therapy: Secondary | ICD-10-CM | POA: Insufficient documentation

## 2017-07-30 DIAGNOSIS — R519 Headache, unspecified: Secondary | ICD-10-CM

## 2017-07-30 DIAGNOSIS — R51 Headache: Secondary | ICD-10-CM | POA: Diagnosis not present

## 2017-07-30 DIAGNOSIS — R079 Chest pain, unspecified: Secondary | ICD-10-CM | POA: Diagnosis not present

## 2017-07-30 DIAGNOSIS — R002 Palpitations: Secondary | ICD-10-CM | POA: Diagnosis not present

## 2017-07-30 DIAGNOSIS — F0390 Unspecified dementia without behavioral disturbance: Secondary | ICD-10-CM | POA: Diagnosis not present

## 2017-07-30 LAB — BASIC METABOLIC PANEL
Anion gap: 11 (ref 5–15)
BUN: 15 mg/dL (ref 6–20)
CALCIUM: 9.7 mg/dL (ref 8.9–10.3)
CHLORIDE: 103 mmol/L (ref 101–111)
CO2: 26 mmol/L (ref 22–32)
CREATININE: 0.87 mg/dL (ref 0.44–1.00)
GFR calc Af Amer: >60 mL/min (ref >60)
GFR calc non Af Amer: 57 mL/min — ABNORMAL LOW (ref >60)
GLUCOSE: 101 mg/dL — AB (ref 65–99)
Potassium: 4 mmol/L (ref 3.5–5.1)
Sodium: 140 mmol/L (ref 135–145)

## 2017-07-30 LAB — CBC
HCT: 40.7 % (ref 36.0–46.0)
HEMOGLOBIN: 13.6 g/dL (ref 12.0–15.0)
MCH: 30.2 pg (ref 26.0–34.0)
MCHC: 33.4 g/dL (ref 30.0–36.0)
MCV: 90.4 fL (ref 78.0–100.0)
PLATELETS: 254 10*3/uL (ref 150–400)
RBC: 4.5 MIL/uL (ref 3.87–5.11)
RDW: 13.2 % (ref 11.5–15.5)
WBC: 4.9 10*3/uL (ref 4.0–10.5)

## 2017-07-30 LAB — I-STAT TROPONIN, ED: TROPONIN I, POC: 0 ng/mL (ref 0.00–0.08)

## 2017-07-30 MED ORDER — KETOROLAC TROMETHAMINE 30 MG/ML IJ SOLN
15.0000 mg | Freq: Once | INTRAMUSCULAR | Status: AC
  Administered 2017-07-30: 15 mg via INTRAMUSCULAR
  Filled 2017-07-30: qty 1

## 2017-07-30 NOTE — ED Notes (Signed)
Pt with dementia at baseline, per visitor, pt will have a conversation and minutes later forget the whole conversation. Pt agitated and continuously states "This is ridiculous, I have had these for as long as I can remember. And whenever I come, no one can figure out why and then they just sent me home." This RN spoke with pt and let her know we will do the best we can to control her pain and rule out anything life threatening or emergent.

## 2017-07-30 NOTE — ED Notes (Addendum)
Pt given crackers, PB, and ice water

## 2017-07-30 NOTE — ED Notes (Signed)
Pt verbalized understanding of d/c instructions, f/u, and pain management. All belongings with pt. Pt wheeled out to lobby

## 2017-07-30 NOTE — ED Notes (Signed)
Pt ambulatory to restroom with steady gait.

## 2017-07-30 NOTE — ED Provider Notes (Signed)
[facility] EMERGENCY DEPARTMENT Provider Note   CSN: [id number] Arrival date & time: 07/30/17  1001     History   Chief Complaint Chief Complaint  Patient presents with  . Headache  . Palpitations   Level 5 caveat due to dementia. HPI Colleen Huang is a 84 y.o. female.  HPI Patient presents with left-sided chest pain.  Reportedly had some sharp left-sided chest pain earlier today.  Now resolved.  Also began having headaches.  Has had multiple frequent headaches for a year now.  Has been seen by neurology several times for it.  No clear cause found but there was thought there could be an anxiety component to it.  Has been on several different medicines including Depakote.  Depakote was stopped due to pancreatitis.  Pain is dull in her front head.  Has around 2-3 of them a day.  No vision changes.  No fevers.  No nausea or vomiting.  No abdominal pain.  Patient is asking for food now. Past Medical History:  Diagnosis Date  . Anxiety    anxiety with episodes of angry outbursts" feels as being treated as a child"  . Dementia   . Diabetes mellitus without complication (HCC)    past history in Epic note from Neurlogy visit  . Headache    recent neurology visit to evaluate headaches.  . Hearing loss    wears hearing aids  . Memory difficulties    "repeats self alot"- has increasing levels of memory issues  . Pancreatitis 04/2015   [facility]    Patient Active Problem List   Diagnosis Date Noted  . Alzheimer's dementia with behavioral disturbance 04/07/2016  . Acute pancreatitis 08/31/2015  . Chest pain 08/31/2015  . Headache 06/18/2015  . Anxiety state 05/19/2015  . Pain in the chest   . Abdominal pain, epigastric 05/18/2015  . Chest pain at rest 05/18/2015  . Dyslipidemia 05/18/2015  . Benign essential HTN 05/18/2015  . Other specified hypothyroidism 05/18/2015    Past Surgical History:  Procedure  Laterality Date  . APPENDECTOMY  1935  . CHOLECYSTECTOMY    . COLONOSCOPY  2005, 2009  . EUS N/A 09/18/2015   Procedure: UPPER ENDOSCOPIC ULTRASOUND (EUS) LINEAR;  Surgeon: Jeani Hawking, MD;  Location: [facility];  Service: Endoscopy;  Laterality: N/A;  . FLEXIBLE SIGMOIDOSCOPY  2005  . GALLBLADDER SURGERY  1997  . PARATHYROIDECTOMY / EXPLORATION OF PARATHYROIDS  2003  . RECTAL POLYPECTOMY  2005  . TONSILLECTOMY  1949    OB History    No data available       Home Medications    Prior to Admission medications   Medication Sig Start Date End Date Taking? Authorizing Provider  amLODipine (NORVASC) 10 MG tablet Take 10 mg by mouth daily. 04/09/16   [provider]  Calcium-Vitamin D 600-200 MG-UNIT tablet Take 1 tablet by mouth daily with breakfast.    [provider]  hydrOXYzine (VISTARIL) 25 MG capsule take 1 capsule by mouth AT NOON if needed 09/09/16   [provider]  latanoprost (XALATAN) 0.005 % ophthalmic solution Place 1 drop into both eyes at bedtime.     [provider]  levothyroxine (SYNTHROID, LEVOTHROID) 88 MCG tablet 88 mcg daily. 03/19/17   [provider]  LORazepam (ATIVAN) 0.5 MG tablet Take 0.5 mg by mouth 2 (two) times daily as needed for anxiety. 03/24/16   [provider]  memantine (NAMENDA) 5 MG  tablet Start with one pill daily for 2-4 weeks. Increase to one pill twice daily as needed. 04/10/17   Butch Penny, NP  Omega-3 Fatty Acids (FISH OIL PO) Take 1 capsule by mouth 2 (two) times daily.    [provider]    Family History Family History  Problem Relation Age of Onset  . Dementia Brother     Social History Social History   Tobacco Use  . Smoking status: Never Smoker  . Smokeless tobacco: Never Used  Substance Use Topics  . Alcohol use: No    Alcohol/week: 0.0 oz  . Drug use: No     Allergies   Codeine; Hydrocodone; Penicillins; Percocet [oxycodone-acetaminophen]; and Sulfa  antibiotics   Review of Systems Review of Systems  Unable to perform ROS: Dementia     Physical Exam Updated Vital Signs BP 123/62   Pulse 77   Temp 97.9 F (36.6 C) (Oral)   Resp 20   SpO2 100%   Physical Exam  Constitutional: She appears well-developed.  HENT:  Head: Atraumatic.  Eyes: EOM are normal.  Neck: Neck supple.  Cardiovascular: Normal rate.  Pulmonary/Chest: Effort normal.  Abdominal: There is no tenderness.  Musculoskeletal: She exhibits no tenderness.  Neurological: She is alert.  Patient is awake and pleasant.  Able answer most questions.  Moving all extremities.  No apparent distress.  Skin: Skin is warm.  Psychiatric: She has a normal mood and affect.     ED Treatments / Results  Labs (all labs ordered are listed, but only abnormal results are displayed) Labs Reviewed  BASIC METABOLIC PANEL - Abnormal; Notable for the following components:      Result Value   Glucose, Bld 101 (*)    GFR calc non Af Amer 57 (*)    All other components within normal limits  CBC  I-STAT TROPONIN, ED    EKG  EKG Interpretation  Date/Time:  Sunday July 30 2017 10:05:06 EST Ventricular Rate:  76 PR Interval:  174 QRS Duration: 78 QT Interval:  400 QTC Calculation: 450 R Axis:   122 Text Interpretation:  Normal sinus rhythm Right axis deviation Nonspecific ST abnormality Abnormal ECG No significant change since last tracing Confirmed by Benjiman Core [id number]) on 07/30/2017 1:04:58 PM       Radiology Dg Chest 2 View  Result Date: 07/30/2017 CLINICAL DATA:  Left chest pain, palpitations EXAM: CHEST  2 VIEW COMPARISON:  04/25/2016 FINDINGS: Normal heart size and vascularity. No focal pneumonia, collapse or consolidation. Negative for edema, effusion or pneumothorax. Trachea is midline. Atherosclerosis of the aorta. Degenerative changes of the spine. Bones are osteopenic. No severe thoracic compression fracture IMPRESSION: Stable chest exam.  No acute  chest process. Thoracic aortic atherosclerosis Electronically Signed   By: Judie Petit.  Shick M.D.   On: 07/30/2017 10:35    Procedures Procedures (including critical care time)  Medications Ordered in ED Medications  ketorolac (TORADOL) 30 MG/ML injection 15 mg (15 mg Intramuscular Given 07/30/17 1327)     Initial Impression / Assessment and Plan / ED Course  I have reviewed the triage vital signs and the nursing notes.  Pertinent labs & imaging results that were available during my care of the patient were reviewed by me and considered in my medical decision making (see chart for details).     Patient with headache.  Frequent daily headaches.  Has been worked up previously.  Reviewed neurology notes.  Headache feeling somewhat better now.  Also reported he had some  chest pain.  Resolved.  Patient eager to go home.  Has eaten.  Discharge home.  Final Clinical Impressions(s) / ED Diagnoses   Final diagnoses:  Daily headache  Palpitations    ED Discharge Orders    None       Benjiman CorePickering, Evelette Hollern, MD 07/30/17 1419

## 2017-07-30 NOTE — ED Triage Notes (Signed)
Pts only complaint is headache. Pt has hx of dementia, family reports pt having palpitations. ekg done at triage.

## 2017-08-31 DIAGNOSIS — E119 Type 2 diabetes mellitus without complications: Secondary | ICD-10-CM | POA: Diagnosis not present

## 2017-08-31 DIAGNOSIS — H401131 Primary open-angle glaucoma, bilateral, mild stage: Secondary | ICD-10-CM | POA: Diagnosis not present

## 2017-09-21 DIAGNOSIS — F4322 Adjustment disorder with anxiety: Secondary | ICD-10-CM | POA: Diagnosis not present

## 2017-10-10 ENCOUNTER — Encounter: Payer: Self-pay | Admitting: Neurology

## 2017-10-10 ENCOUNTER — Ambulatory Visit: Payer: Medicare Other | Admitting: Neurology

## 2017-10-10 VITALS — BP 117/63 | HR 74 | Ht 65.0 in | Wt 158.0 lb

## 2017-10-10 DIAGNOSIS — F0391 Unspecified dementia with behavioral disturbance: Secondary | ICD-10-CM | POA: Diagnosis not present

## 2017-10-10 MED ORDER — SERTRALINE HCL 50 MG PO TABS: 50.0000 mg | ORAL_TABLET | Freq: Every day | ORAL | 11 refills | Status: DC
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
## 2017-10-10 NOTE — Patient Instructions (Addendum)
Start Zoloft  daily for agitation and behavioral issues    Sertraline tablets What is this medicine? SERTRALINE (SER tra leen) is used to treat depression. It may also be used to treat obsessive compulsive disorder, panic disorder, post-trauma stress, premenstrual dysphoric disorder (PMDD), anxiety and for behavioral problems in dementia This medicine may be used for other purposes; ask your health care provider or pharmacist if you have questions. COMMON BRAND NAME(S): Zoloft What should I tell my health care provider before I take this medicine? They need to know if you have any of these conditions: -bleeding disorders -bipolar disorder or a family history of bipolar disorder -glaucoma -heart disease -high blood pressure -history of irregular heartbeat -history of low levels of calcium, magnesium, or potassium in the blood -if you often drink alcohol -liver disease -receiving electroconvulsive therapy -seizures -suicidal thoughts, plans, or attempt; a previous suicide attempt by you or a family member -take medicines that treat or prevent blood clots -thyroid disease -an unusual or allergic reaction to sertraline, other medicines, foods, dyes, or preservatives -pregnant or trying to get pregnant -breast-feeding How should I use this medicine? Take this medicine by mouth with a glass of water. Follow the directions on the prescription label. You can take it with or without food. Take your medicine at regular intervals. Do not take your medicine more often than directed. Do not stop taking this medicine suddenly except upon the advice of your doctor. Stopping this medicine too quickly may cause serious side effects or your condition may worsen. A special MedGuide will be given to you by the pharmacist with each prescription and refill. Be sure to read this information carefully each time. Talk to your pediatrician regarding the use of this medicine in children. While this drug may  be prescribed for children as young as 7 years for selected conditions, precautions do apply. Overdosage: If you think you have taken too much of this medicine contact a poison control center or emergency room at once. NOTE: This medicine is only for you. Do not share this medicine with others. What if I miss a dose? If you miss a dose, take it as soon as you can. If it is almost time for your next dose, take only that dose. Do not take double or extra doses. What may interact with this medicine? Do not take this medicine with any of the following medications: -cisapride -dofetilide -dronedarone -linezolid -MAOIs like Carbex, Eldepryl, Marplan, Nardil, and Parnate -methylene blue (injected into a vein) -pimozide -thioridazine This medicine may also interact with the following medications: -alcohol -amphetamines -aspirin and aspirin-like medicines -certain medicines for depression, anxiety, or psychotic disturbances -certain medicines for fungal infections like ketoconazole, fluconazole, posaconazole, and itraconazole -certain medicines for irregular heart beat like flecainide, quinidine, propafenone -certain medicines for migraine headaches like almotriptan, eletriptan, frovatriptan, naratriptan, rizatriptan, sumatriptan, zolmitriptan -certain medicines for sleep -certain medicines for seizures like carbamazepine, valproic acid, phenytoin -certain medicines that treat or prevent blood clots like warfarin, enoxaparin, dalteparin -cimetidine -digoxin -diuretics -fentanyl -isoniazid -lithium -NSAIDs, medicines for pain and inflammation, like ibuprofen or naproxen -other medicines that prolong the QT interval (cause an abnormal heart rhythm) -rasagiline -safinamide -supplements like St. John's wort, kava kava, valerian -tolbutamide -tramadol -tryptophan This list may not describe all possible interactions. Give your health care provider a list of all the medicines, herbs,  non-prescription drugs, or dietary supplements you use. Also tell them if you smoke, drink alcohol, or use illegal drugs. Some items may interact with your  medicine. What should I watch for while using this medicine? Tell your doctor if your symptoms do not get better or if they get worse. Visit your doctor or health care professional for regular checks on your progress. Because it may take several weeks to see the full effects of this medicine, it is important to continue your treatment as prescribed by your doctor. Patients and their families should watch out for new or worsening thoughts of suicide or depression. Also watch out for sudden changes in feelings such as feeling anxious, agitated, panicky, irritable, hostile, aggressive, impulsive, severely restless, overly excited and hyperactive, or not being able to sleep. If this happens, especially at the beginning of treatment or after a change in dose, call your health care professional. Bonita Quin may get drowsy or dizzy. Do not drive, use machinery, or do anything that needs mental alertness until you know how this medicine affects you. Do not stand or sit up quickly, especially if you are an older patient. This reduces the risk of dizzy or fainting spells. Alcohol may interfere with the effect of this medicine. Avoid alcoholic drinks. Your mouth may get dry. Chewing sugarless gum or sucking hard candy, and drinking plenty of water may help. Contact your doctor if the problem does not go away or is severe. What side effects may I notice from receiving this medicine? Side effects that you should report to your doctor or health care professional as soon as possible: -allergic reactions like skin rash, itching or hives, swelling of the face, lips, or tongue -anxious -black, tarry stools -changes in vision -confusion -elevated mood, decreased need for sleep, racing thoughts, impulsive behavior -eye pain -fast, irregular heartbeat -feeling faint or  lightheaded, falls -feeling agitated, angry, or irritable -hallucination, loss of contact with reality -loss of balance or coordination -loss of memory -painful or prolonged erections -restlessness, pacing, inability to keep still -seizures -stiff muscles -suicidal thoughts or other mood changes -trouble sleeping -unusual bleeding or bruising -unusually weak or tired -vomiting Side effects that usually do not require medical attention (report to your doctor or health care professional if they continue or are bothersome): -change in appetite or weight -change in sex drive or performance -diarrhea -increased sweating -indigestion, nausea -tremors This list may not describe all possible side effects. Call your doctor for medical advice about side effects. You may report side effects to FDA at 1-800-FDA-1088. Where should I keep my medicine? Keep out of the reach of children. Store at room temperature between 15 and 30 degrees C (59 and 86 degrees F). Throw away any unused medicine after the expiration date. NOTE: This sheet is a summary. It may not cover all possible information. If you have questions about this medicine, talk to your doctor, pharmacist, or health care provider.  2018 [employer] Standard (2016-06-03 14:17:49)  Provided this literature: https://[email or web address]-price.com/

## 2017-10-16 DIAGNOSIS — L259 Unspecified contact dermatitis, unspecified cause: Secondary | ICD-10-CM | POA: Diagnosis not present

## 2017-10-31 ENCOUNTER — Ambulatory Visit
Admission: RE | Admit: 2017-10-31 | Discharge: 2017-10-31 | Disposition: A | Discharge status: Home / Self Care | Payer: Medicare Other | Source: Ambulatory Visit | Attending: Family Medicine | Admitting: Family Medicine

## 2017-10-31 ENCOUNTER — Other Ambulatory Visit: Payer: Self-pay | Admitting: Family Medicine

## 2017-10-31 DIAGNOSIS — M79674 Pain in right toe(s): Secondary | ICD-10-CM

## 2017-10-31 DIAGNOSIS — S90211A Contusion of right great toe with damage to nail, initial encounter: Secondary | ICD-10-CM | POA: Diagnosis not present

## 2017-10-31 DIAGNOSIS — M7989 Other specified soft tissue disorders: Secondary | ICD-10-CM | POA: Diagnosis not present

## 2017-11-17 DIAGNOSIS — H9201 Otalgia, right ear: Secondary | ICD-10-CM | POA: Diagnosis not present

## 2018-01-03 DIAGNOSIS — E119 Type 2 diabetes mellitus without complications: Secondary | ICD-10-CM | POA: Diagnosis not present

## 2018-01-03 DIAGNOSIS — I10 Essential (primary) hypertension: Secondary | ICD-10-CM | POA: Diagnosis not present

## 2018-01-03 DIAGNOSIS — E039 Hypothyroidism, unspecified: Secondary | ICD-10-CM | POA: Diagnosis not present

## 2018-01-03 DIAGNOSIS — F411 Generalized anxiety disorder: Secondary | ICD-10-CM | POA: Diagnosis not present

## 2018-01-16 ENCOUNTER — Ambulatory Visit
Admission: RE | Admit: 2018-01-16 | Discharge: 2018-01-16 | Disposition: A | Discharge status: Home / Self Care | Payer: Medicare Other | Source: Ambulatory Visit | Attending: Family Medicine | Admitting: Family Medicine

## 2018-01-16 ENCOUNTER — Other Ambulatory Visit: Payer: Self-pay | Admitting: Family Medicine

## 2018-01-16 DIAGNOSIS — M545 Low back pain: Secondary | ICD-10-CM | POA: Diagnosis not present

## 2018-01-16 DIAGNOSIS — Y92009 Unspecified place in unspecified non-institutional (private) residence as the place of occurrence of the external cause: Principal | ICD-10-CM

## 2018-01-16 DIAGNOSIS — M5489 Other dorsalgia: Secondary | ICD-10-CM

## 2018-01-16 DIAGNOSIS — S3992XA Unspecified injury of lower back, initial encounter: Secondary | ICD-10-CM | POA: Diagnosis not present

## 2018-01-16 DIAGNOSIS — W19XXXA Unspecified fall, initial encounter: Secondary | ICD-10-CM

## 2018-01-20 IMAGING — US US ABDOMEN COMPLETE
1 series · 13 of 25 positions shown · non-contrast
Comparison: MRCP 08/31/2015

CLINICAL DATA: Abdominal pain with elevated bilirubin and lipase

EXAM:
ABDOMEN ULTRASOUND COMPLETE

[Series 1: us abdomen complete · 0.23mm/px · 13 of 83 slices shown]
[im 1/83]
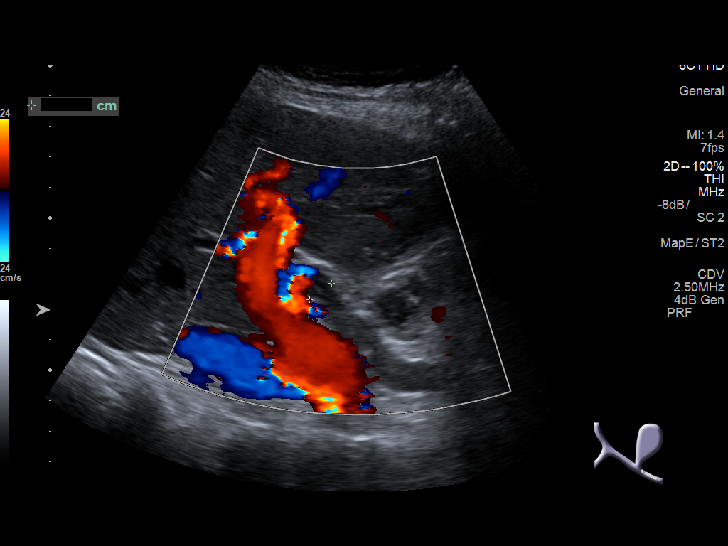
[im 7/83]
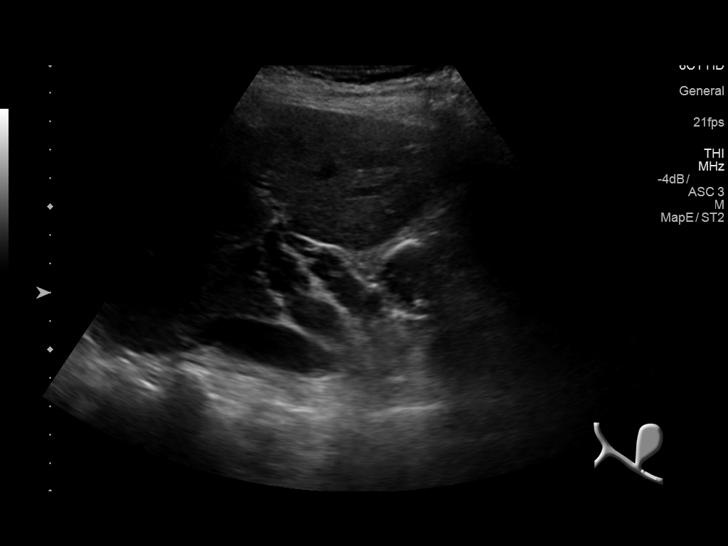
[im 14/83]
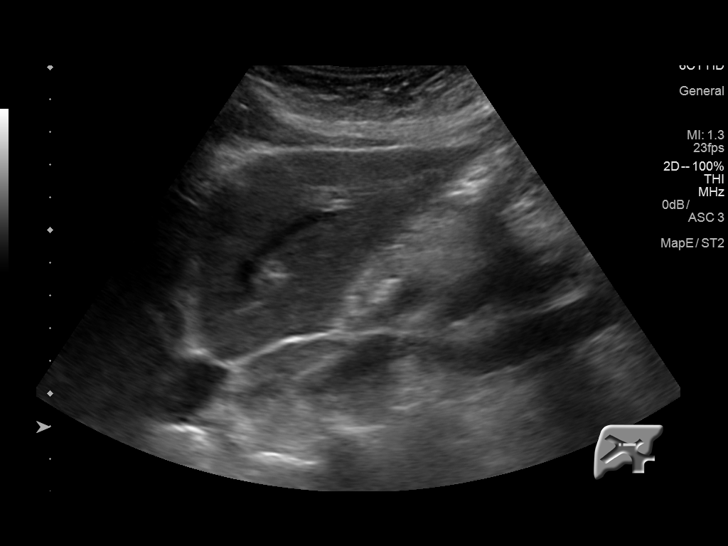
[im 21/83]
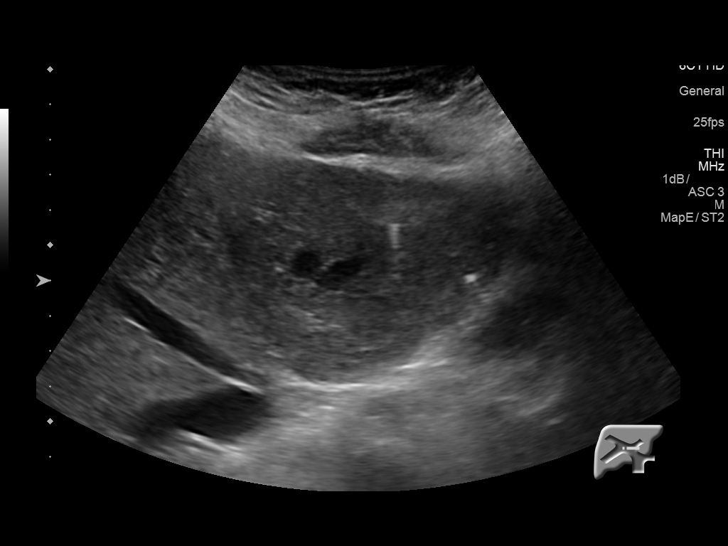
[im 28/83]
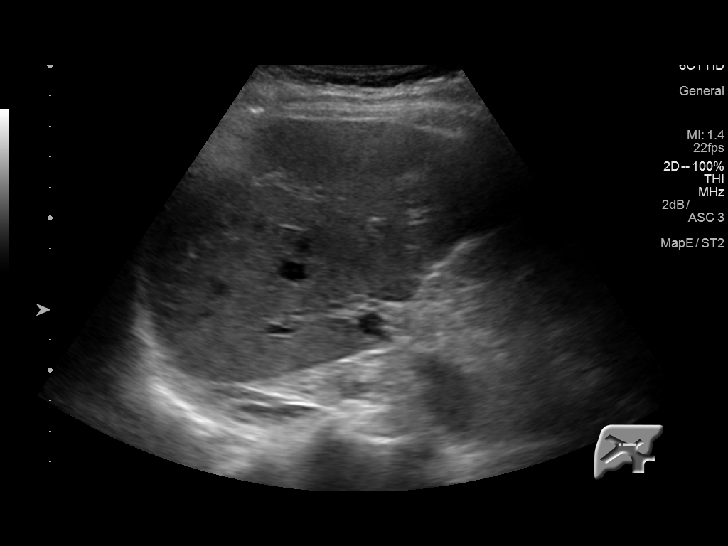
[im 35/83]
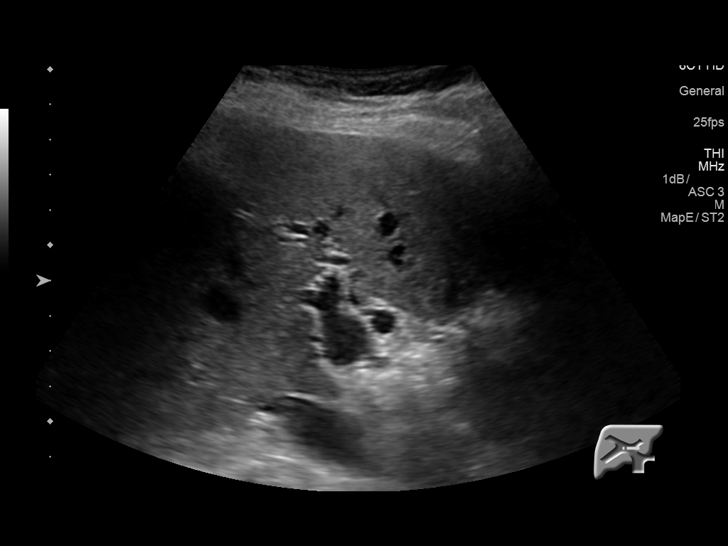
[im 42/83]
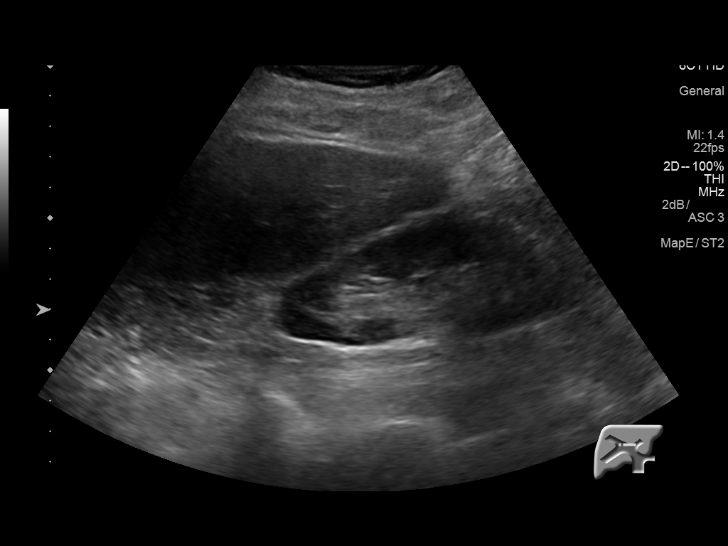
[im 48/83]
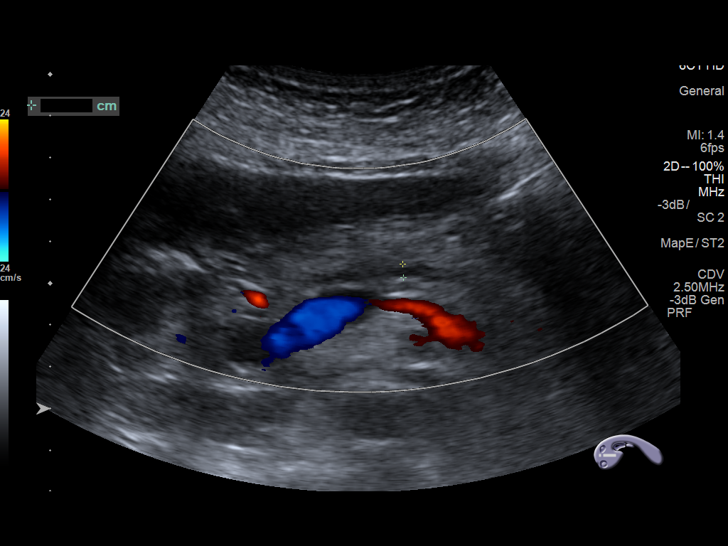
[im 55/83]
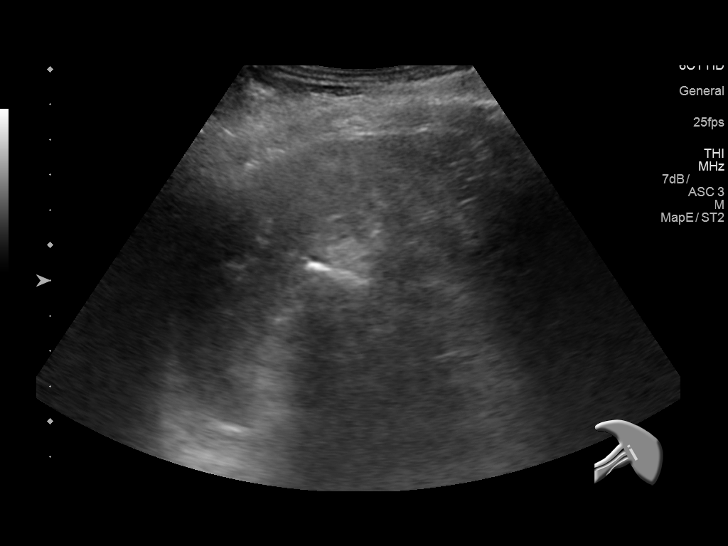
[im 62/83]
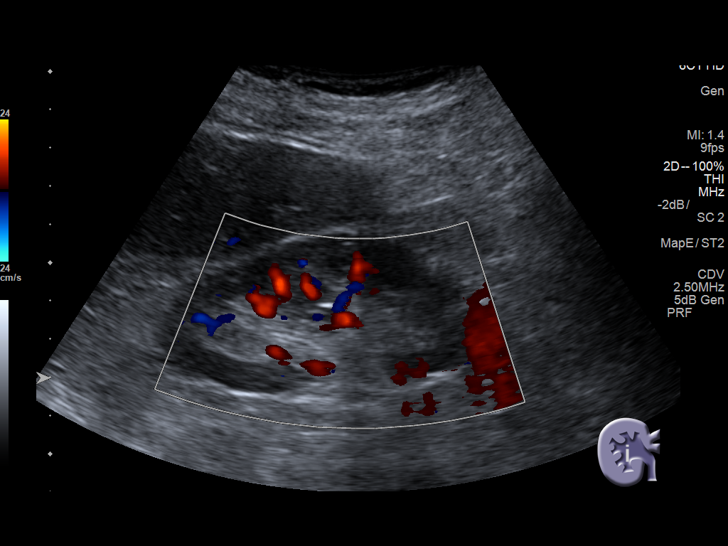
[im 69/83]
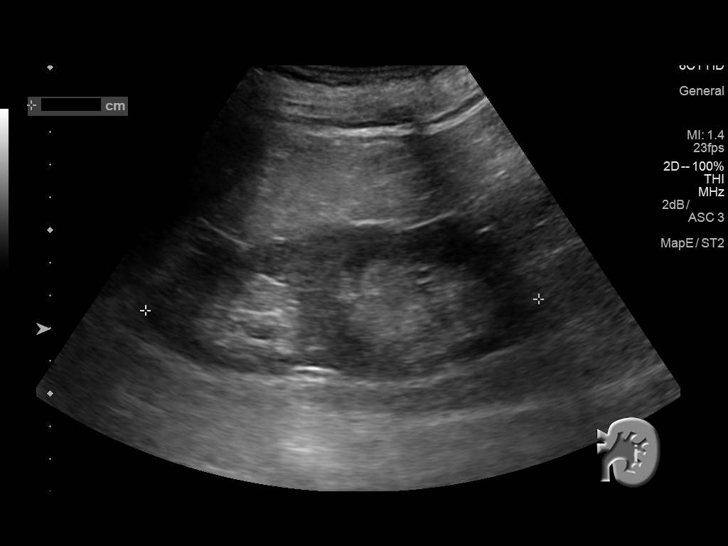
[im 76/83]
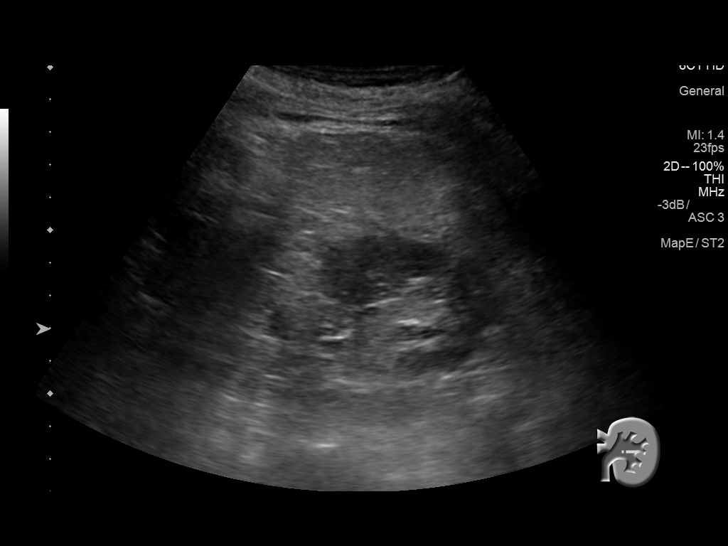
[im 83/83]
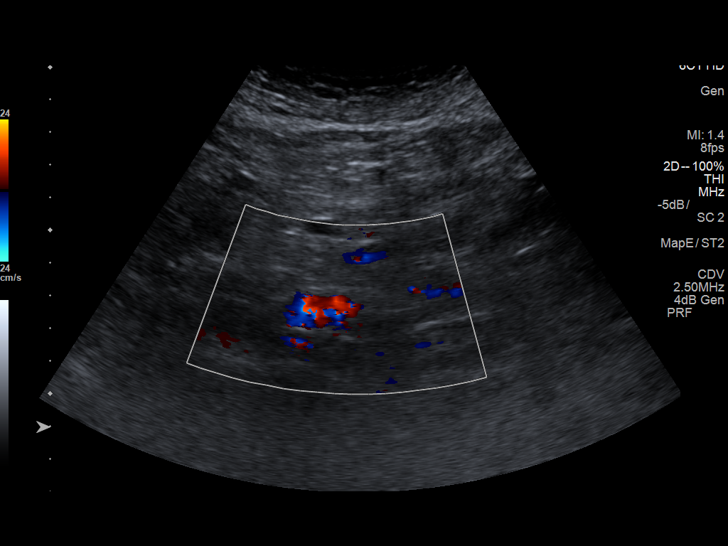

[13 of 25 positions shown; findings below may reference images not displayed]

FINDINGS: Gallbladder: Surgically absent

Common bile duct: Diameter: 9 mm and stable from prior. Where
visualized, no filling defect. There is chronic intrahepatic bile
duct enlargement.

Liver: There is punctate hyperechoic structure/s repeatedly seen in
the left liver with ring down artifact. No pneumobilia seen on
previous imaging. No evidence of focal mass. Antegrade flow in the
main portal vein.

IVC: No abnormality visualized.

Pancreas: 2 to 3 mm main duct. The patient underwent recent
endoscopic ultrasound showing no pancreatic mass. There is no
visible pancreatitis to correlate with history.

Spleen: Size and appearance within normal limits.

Right Kidney: Length: 10 cm. Echogenicity within normal limits. No
mass or hydronephrosis visualized.

Left Kidney: Length: 12 cm. Echogenicity within normal limits. No
mass or hydronephrosis visualized.

Abdominal aorta: Atherosclerosis without aneurysm.
IMPRESSION: 1. Chronic bile duct enlargement without visible
choledocholithiasis.
2. Evidence of pneumobilia in the left liver. Has there been prior
sphincterotomy?
3. No evidence of peripancreatic collection.

## 2018-02-19 DIAGNOSIS — F4322 Adjustment disorder with anxiety: Secondary | ICD-10-CM | POA: Diagnosis not present

## 2018-02-21 ENCOUNTER — Encounter (HOSPITAL_COMMUNITY): Payer: Self-pay | Admitting: Obstetrics and Gynecology

## 2018-02-21 ENCOUNTER — Emergency Department (HOSPITAL_COMMUNITY): Payer: Medicare Other

## 2018-02-21 ENCOUNTER — Emergency Department (HOSPITAL_COMMUNITY)
Admission: EM | Admit: 2018-02-21 | Discharge: 2018-02-21 | Disposition: A | Discharge status: Home / Self Care | Payer: Medicare Other | Attending: Emergency Medicine | Admitting: Emergency Medicine

## 2018-02-21 ENCOUNTER — Other Ambulatory Visit: Payer: Self-pay

## 2018-02-21 DIAGNOSIS — Y939 Activity, unspecified: Secondary | ICD-10-CM | POA: Diagnosis not present

## 2018-02-21 DIAGNOSIS — S199XXA Unspecified injury of neck, initial encounter: Secondary | ICD-10-CM | POA: Diagnosis not present

## 2018-02-21 DIAGNOSIS — F0280 Dementia in other diseases classified elsewhere without behavioral disturbance: Secondary | ICD-10-CM | POA: Diagnosis not present

## 2018-02-21 DIAGNOSIS — Y999 Unspecified external cause status: Secondary | ICD-10-CM | POA: Diagnosis not present

## 2018-02-21 DIAGNOSIS — F0390 Unspecified dementia without behavioral disturbance: Secondary | ICD-10-CM | POA: Diagnosis not present

## 2018-02-21 DIAGNOSIS — Z79899 Other long term (current) drug therapy: Secondary | ICD-10-CM | POA: Diagnosis not present

## 2018-02-21 DIAGNOSIS — S51011A Laceration without foreign body of right elbow, initial encounter: Secondary | ICD-10-CM

## 2018-02-21 DIAGNOSIS — R2681 Unsteadiness on feet: Secondary | ICD-10-CM | POA: Insufficient documentation

## 2018-02-21 DIAGNOSIS — R42 Dizziness and giddiness: Secondary | ICD-10-CM | POA: Diagnosis not present

## 2018-02-21 DIAGNOSIS — I951 Orthostatic hypotension: Secondary | ICD-10-CM | POA: Diagnosis not present

## 2018-02-21 DIAGNOSIS — W19XXXA Unspecified fall, initial encounter: Secondary | ICD-10-CM

## 2018-02-21 DIAGNOSIS — Y92122 Bedroom in nursing home as the place of occurrence of the external cause: Secondary | ICD-10-CM | POA: Diagnosis not present

## 2018-02-21 DIAGNOSIS — S0990XA Unspecified injury of head, initial encounter: Secondary | ICD-10-CM | POA: Diagnosis not present

## 2018-02-21 DIAGNOSIS — E119 Type 2 diabetes mellitus without complications: Secondary | ICD-10-CM | POA: Insufficient documentation

## 2018-02-21 DIAGNOSIS — S59901A Unspecified injury of right elbow, initial encounter: Secondary | ICD-10-CM | POA: Diagnosis not present

## 2018-02-21 DIAGNOSIS — M25521 Pain in right elbow: Secondary | ICD-10-CM | POA: Diagnosis not present

## 2018-02-21 DIAGNOSIS — G309 Alzheimer's disease, unspecified: Secondary | ICD-10-CM | POA: Insufficient documentation

## 2018-02-21 DIAGNOSIS — S299XXA Unspecified injury of thorax, initial encounter: Secondary | ICD-10-CM | POA: Diagnosis not present

## 2018-02-21 LAB — I-STAT TROPONIN, ED
Troponin i, poc: 0.02 ng/mL (ref 0.00–0.08)
Troponin i, poc: 0.01 ng/mL (ref 0.00–0.08)

## 2018-02-21 LAB — COMPREHENSIVE METABOLIC PANEL
ALBUMIN: 4.3 g/dL (ref 3.5–5.0)
ALK PHOS: 104 U/L (ref 38–126)
ALT: 17 U/L (ref 0–44)
ANION GAP: 11 (ref 5–15)
AST: 25 U/L (ref 15–41)
BILIRUBIN TOTAL: 1.1 mg/dL (ref 0.3–1.2)
BUN: 18 mg/dL (ref 8–23)
CALCIUM: 10 mg/dL (ref 8.9–10.3)
CO2: 27 mmol/L (ref 22–32)
Chloride: 102 mmol/L (ref 98–111)
Creatinine, Ser: 0.77 mg/dL (ref 0.44–1.00)
GFR calc Af Amer: >60 mL/min (ref >60)
GFR calc non Af Amer: >60 mL/min (ref >60)
GLUCOSE: 100 mg/dL — AB (ref 70–99)
POTASSIUM: 4.3 mmol/L (ref 3.5–5.1)
SODIUM: 140 mmol/L (ref 135–145)
TOTAL PROTEIN: 7.9 g/dL (ref 6.5–8.1)

## 2018-02-21 LAB — URINALYSIS, ROUTINE W REFLEX MICROSCOPIC
BACTERIA UA: NONE SEEN
Bilirubin Urine: NEGATIVE
GLUCOSE, UA: NEGATIVE mg/dL
HGB URINE DIPSTICK: NEGATIVE
Ketones, ur: NEGATIVE mg/dL
Nitrite: NEGATIVE
Protein, ur: NEGATIVE mg/dL
Specific Gravity, Urine: 1.006 (ref 1.005–1.030)
pH: 8 (ref 5.0–8.0)

## 2018-02-21 LAB — CBC WITH DIFFERENTIAL/PLATELET
BASOS ABS: 0 10*3/uL (ref 0.0–0.1)
Basophils Relative: 0 %
EOS PCT: 1 %
Eosinophils Absolute: 0.1 10*3/uL (ref 0.0–0.7)
HEMATOCRIT: 40.8 % (ref 36.0–46.0)
Hemoglobin: 13.4 g/dL (ref 12.0–15.0)
LYMPHS PCT: 11 %
Lymphs Abs: 1 10*3/uL (ref 0.7–4.0)
MCH: 28.9 pg (ref 26.0–34.0)
MCHC: 32.8 g/dL (ref 30.0–36.0)
MCV: 88.1 fL (ref 78.0–100.0)
MONO ABS: 0.7 10*3/uL (ref 0.1–1.0)
MONOS PCT: 8 %
NEUTROS ABS: 7.1 10*3/uL (ref 1.7–7.7)
Neutrophils Relative %: 80 %
PLATELETS: 298 10*3/uL (ref 150–400)
RBC: 4.63 MIL/uL (ref 3.87–5.11)
RDW: 13.9 % (ref 11.5–15.5)
WBC: 8.9 10*3/uL (ref 4.0–10.5)

## 2018-02-21 LAB — LIPASE, BLOOD: Lipase: 35 U/L (ref 11–51)

## 2018-02-21 MED ORDER — LORAZEPAM 0.5 MG PO TABS
0.5000 mg | ORAL_TABLET | Freq: Two times a day (BID) | ORAL | Status: DC | PRN
  Administered 2018-02-21: 0.5 mg via ORAL
  Filled 2018-02-21: qty 1

## 2018-02-21 MED ORDER — SODIUM CHLORIDE 0.9 % IV BOLUS
1000.0000 mL | Freq: Once | INTRAVENOUS | Status: AC
  Administered 2018-02-21: 1000 mL via INTRAVENOUS

## 2018-02-21 MED ORDER — METOCLOPRAMIDE HCL 5 MG/ML IJ SOLN
10.0000 mg | Freq: Once | INTRAMUSCULAR | Status: AC
  Administered 2018-02-21: 10 mg via INTRAVENOUS
  Filled 2018-02-21: qty 2

## 2018-02-21 MED ORDER — TETANUS-DIPHTH-ACELL PERTUSSIS 5-2.5-18.5 LF-MCG/0.5 IM SUSP
0.5000 mL | Freq: Once | INTRAMUSCULAR | Status: AC
  Administered 2018-02-21: 0.5 mL via INTRAMUSCULAR
  Filled 2018-02-21: qty 0.5

## 2018-02-21 NOTE — ED Notes (Signed)
Son of patient, who is POA, is at bedside.

## 2018-02-21 NOTE — ED Provider Notes (Signed)
[facility] DEPT Provider Note   CSN: [id number] Arrival date & time: 02/21/18  1306     History   Chief Complaint Chief Complaint  Patient presents with  . Fall    HPI Colleen Huang is a 84 y.o. female with history of Alzheimer's dementia, headaches, anxiety is brought to the ER for evaluation after unwitnessed fall x2.  History is obtained from patient and home health caregiver who is at bedside.  Patient states she has a severe frontal headache.  Remembers falling twice but does not remember what she was doing.  She gets up in the middle of the night sometimes. Additionally reports dizziness every time she tries to move, better when sitting or laying down.  She is hungry and wants something to eat.  Sitter states that night sitter heard a thud and found patient on the ground by the footboard of her bed around 9 PM last night.  Morning sitter arrived around 8 AM and found patient on the ground at the door of the bathroom bleeding from a small wound on the right elbow.  Following the falls, this morning patient did not want to get out of bed stating that she was dizzy every time she moved.  Sitter noticed dry heaving and one episode of yellow emesis after attempted getting out of bed.  Sitter called PCP who advised her to come to the ER. Level 5 caveat applies given h/o dementia.   Patient denies any vision changes, neck pain, back pain, chest pain, abdominal pain.  Sitter states patient has been at her baseline leading up to the falls without any recent fevers, appetite changes, cough or any other complaints.  HPI  Past Medical History:  Diagnosis Date  . Anxiety    anxiety with episodes of angry outbursts" feels as being treated as a child"  . Dementia   . Diabetes mellitus without complication ([place])    past history in Epic note from Neurlogy visit  . Falls   . Headache    recent neurology visit to evaluate headaches.  . Hearing loss    wears  hearing aids  . Memory difficulties    "repeats self alot"- has increasing levels of memory issues  . Pancreatitis 04/2015   recent hospital visit to [facility] 08-31-15.    Patient Active Problem List   Diagnosis Date Noted  . Alzheimer's dementia with behavioral disturbance 04/07/2016  . Acute pancreatitis 08/31/2015  . Chest pain 08/31/2015  . Headache 06/18/2015  . Anxiety state 05/19/2015  . Pain in the chest   . Abdominal pain, epigastric 05/18/2015  . Chest pain at rest 05/18/2015  . Dyslipidemia 05/18/2015  . Benign essential HTN 05/18/2015  . Other specified hypothyroidism 05/18/2015    Past Surgical History:  Procedure Laterality Date  . APPENDECTOMY  1935  . CHOLECYSTECTOMY    . COLONOSCOPY  2005, 2009  . EUS N/A 09/18/2015   Procedure: UPPER ENDOSCOPIC ULTRASOUND (EUS) LINEAR;  Surgeon: Carol Ada, MD;  Location: [facility] ENDOSCOPY;  Service: Endoscopy;  Laterality: N/A;  . FLEXIBLE SIGMOIDOSCOPY  2005  . [place]  . PARATHYROIDECTOMY / EXPLORATION OF PARATHYROIDS  2003  . RECTAL POLYPECTOMY  2005  . TONSILLECTOMY  1949     OB History    Gravida      Para      Term      Preterm      AB      Living  3  SAB      TAB      Ectopic      Multiple      Live Births               Home Medications    Prior to Admission medications   Medication Sig Start Date End Date Taking? Authorizing Provider  amLODipine (NORVASC) 10 MG tablet Take 10 mg by mouth daily. 04/09/16  Yes [provider]  Calcium-Vitamin D 600-200 MG-UNIT tablet Take 1 tablet by mouth daily with breakfast.   Yes [provider]  latanoprost (XALATAN) 0.005 % ophthalmic solution Place 1 drop into both eyes at bedtime.    Yes [provider]  levothyroxine (SYNTHROID, LEVOTHROID) 88 MCG tablet 88 mcg daily. 03/19/17  Yes [provider]  LORazepam (ATIVAN) 0.5 MG tablet Take 0.5 mg by mouth 2 (two) times daily as needed for  anxiety. 03/24/16  Yes [provider]  Multiple Vitamins-Minerals (MULTIVITAMIN PO) Take by mouth daily.   Yes [provider]  sertraline (ZOLOFT) 50 MG tablet Take 1 tablet (50 mg total) by mouth daily. Patient taking differently: Take 50 mg by mouth 2 (two) times daily.  10/10/17  Yes Melvenia Beam, MD    Family History Family History  Problem Relation Age of Onset  . Dementia Brother     Social History Social History   Tobacco Use  . Smoking status: Never Smoker  . Smokeless tobacco: Never Used  Substance Use Topics  . Alcohol use: No    Alcohol/week: 0.0 standard drinks  . Drug use: No     Allergies   Codeine; Hydrocodone; Penicillins; Percocet [oxycodone-acetaminophen]; and Sulfa antibiotics   Review of Systems Review of Systems  Unable to perform ROS: Dementia  Gastrointestinal: Positive for nausea and vomiting.  Neurological: Positive for dizziness and headaches.  Psychiatric/Behavioral:       Memory issues (chronic)  All other systems reviewed and are negative.    Physical Exam Updated Vital Signs BP (!) 120/58 (BP Location: Left Arm)   Pulse 72   Temp 98.2 F (36.8 C) (Oral)   Resp 17   SpO2 96%   Physical Exam  Constitutional: She is oriented to person, place, and time. She appears well-developed and well-nourished. She is cooperative. She is easily aroused.  NAD, awake, talkative, in hall bed. No cervical collar in place.   HENT:  Head: Atraumatic.  No tenderness or crepitus of facial, nasal, scalp bones. No Raccoon's eyes. No Battle's sign.  No epistaxis or rhinorrhea, septum midline.  No intraoral bleeding or injury. No malocclusion.   Eyes: Conjunctivae are normal.  Lids normal. EOMs and PERRL intact.   Neck: Spinous process tenderness present.  C-spine: mild low midline tenderness and paraspinal muscular tenderness.   Cardiovascular: Normal rate, regular rhythm, S1 normal, S2 normal and normal heart sounds. Exam  reveals no distant heart sounds.  Pulses:      Carotid pulses are 2+ on the right side, and 2+ on the left side.      Radial pulses are 2+ on the right side, and 2+ on the left side.       Dorsalis pedis pulses are 2+ on the right side, and 2+ on the left side.  2+ radial and DP pulses bilaterally  Pulmonary/Chest: Effort normal. She has decreased breath sounds in the right lower field and the left lower field. She exhibits tenderness.  Diminished lower lobes anteriorly. Mild tenderness to low anterior chest wall.  Easy WOB.   Abdominal: Soft. There is no tenderness. There is no CVA tenderness.  Musculoskeletal: Normal range of motion. She exhibits no deformity.       Right elbow: She exhibits swelling and laceration. Tenderness found.  Right elbow: diffuse edema, mild ecchymosis and tenderness to olecranon process, full PROM with minimal pain. 3 cm "V" shaped laceration to elbow  T-spine: no paraspinal muscular tenderness or midline tenderness.   L-spine: no paraspinal muscular or midline tenderness.  Pelvis: no instability with AP/L compression, leg shortening or rotation. Full PROM of hips bilaterally without pain.   Neurological: She is alert, oriented to person, place, and time and easily aroused.  Speech is fluent without obvious dysarthria or dysphasia. Strength 5/5 with hand grip and ankle F/E.   Sensation to light touch intact in hands and feet. Normal gait. No pronator drift. No leg drop.  Normal finger-to-nose and finger tapping.  CN I, II and VIII not tested. CN II-XII grossly intact bilaterally.   Skin: Skin is warm and dry. Capillary refill takes less than 2 seconds. Laceration noted.  3 cm V shaped laceration to right posterior elbow, hemostatic, with 3 steri strips in place.  No ecchymosis to upper buttocks, L spine.   Psychiatric: Her behavior is normal. Thought content normal.     ED Treatments / Results  Labs (all labs ordered are listed, but only abnormal results are  displayed) Labs Reviewed  COMPREHENSIVE METABOLIC PANEL - Abnormal; Notable for the following components:      Result Value   Glucose, Bld 100 (*)    All other components within normal limits  URINALYSIS, ROUTINE W REFLEX MICROSCOPIC - Abnormal; Notable for the following components:   Leukocytes, UA TRACE (*)    All other components within normal limits  URINE CULTURE  CBC WITH DIFFERENTIAL/PLATELET  LIPASE, BLOOD  TSH  I-STAT TROPONIN, ED  I-STAT TROPONIN, ED    EKG EKG Interpretation  Date/Time:  Wednesday February 21 2018 15:01:35 EDT Ventricular Rate:  60 PR Interval:    QRS Duration: 87 QT Interval:  445 QTC Calculation: 445 R Axis:   -20 Text Interpretation:  Sinus rhythm Borderline left axis deviation Confirmed by Virgel Manifold [phone]) on 02/21/2018 3:25:46 PM   Radiology Dg Chest 2 View  Result Date: 02/21/2018 CLINICAL DATA:  Fall last night EXAM: CHEST - 2 VIEW COMPARISON:  07/30/2017 FINDINGS: Mild cardiomegaly. Normal vascularity. Low volumes. Bibasilar atelectasis. No pneumothorax. Tiny pleural effusions may be present at the posterior costophrenic angles. IMPRESSION: Cardiomegaly without edema. Bibasilar atelectasis. Possible tiny pleural effusions. Electronically Signed   By: Marybelle Killings M.D.   On: 02/21/2018 15:00   Dg Elbow Complete Right  Result Date: 02/21/2018 CLINICAL DATA:  Status post fall today with elbow pain. EXAM: RIGHT ELBOW - COMPLETE 3+ VIEW COMPARISON:  None. FINDINGS: There is a small bony fragment just superior to the head of radius. This may represent an acute fracture fragment, donor site unclear. There is no dislocation. IMPRESSION: There is a small bony fragment just superior to the head of radius. This may represent an acute fracture fragment, donor site indeterminate. Electronically Signed   By: Abelardo Diesel M.D.   On: 02/21/2018 15:01   Ct Head Wo Contrast  Addendum Date: 02/21/2018   ADDENDUM REPORT: 02/21/2018 15:29 ADDENDUM:  Possible nodule in the isthmus of the thyroid. Thyroid ultrasound is recommended. Electronically Signed   By: Marybelle Killings M.D.   On: 02/21/2018 15:29   Result Date: 02/21/2018  CLINICAL DATA:  Fall last night EXAM: CT HEAD WITHOUT CONTRAST CT CERVICAL SPINE WITHOUT CONTRAST TECHNIQUE: Multidetector CT imaging of the head and cervical spine was performed following the standard protocol without intravenous contrast. Multiplanar CT image reconstructions of the cervical spine were also generated. COMPARISON:  11/17/2015 FINDINGS: CT HEAD FINDINGS Brain: Global atrophy. Chronic ischemic changes in the periventricular white matter. No mass effect, midline shift, or acute intracranial hemorrhage. Vascular: No hyperdense vessel or unexpected calcification. Skull: Cranium is intact. Sinuses/Orbits: Mastoid air cells are clear. There is partial opacification of the left sphenoid sinus. Orbits are within normal limits. Other: Noncontributory CT CERVICAL SPINE FINDINGS Alignment: Straightening of the normally lordotic cervical spine. Slight anterolisthesis C3 upon C4 without dislocation. Skull base and vertebrae: No acute fracture.  No dislocation. Soft tissues and spinal canal: No obvious spinal hematoma. No obvious soft tissue hematoma. 1.7 cm nodule in the isthmus of the nodule cannot be excluded. Tiny metal objects in the posterior soft a Gus at the level of the thyroid most likely represent surgical staples. Disc levels: Posterior osteophytic ridging at C3-4 causes an element of left-sided central stenosis. There is advanced disc space narrowing at C4-5, C5-6, and C6-7 with posterior osteophytic ridging. This leads to varying degrees of spinal stenosis. Upper chest: Negative. Other: Aortic atherosclerotic calcification IMPRESSION: No acute intracranial pathology.  Chronic changes are noted. No acute cervical spine injury.  Chronic changes are noted. Electronically Signed: By: Marybelle Killings M.D. On: 02/21/2018 15:20   Ct  Cervical Spine Wo Contrast  Addendum Date: 02/21/2018   ADDENDUM REPORT: 02/21/2018 15:29 ADDENDUM: Possible nodule in the isthmus of the thyroid. Thyroid ultrasound is recommended. Electronically Signed   By: Marybelle Killings M.D.   On: 02/21/2018 15:29   Result Date: 02/21/2018 CLINICAL DATA:  Fall last night EXAM: CT HEAD WITHOUT CONTRAST CT CERVICAL SPINE WITHOUT CONTRAST TECHNIQUE: Multidetector CT imaging of the head and cervical spine was performed following the standard protocol without intravenous contrast. Multiplanar CT image reconstructions of the cervical spine were also generated. COMPARISON:  11/17/2015 FINDINGS: CT HEAD FINDINGS Brain: Global atrophy. Chronic ischemic changes in the periventricular white matter. No mass effect, midline shift, or acute intracranial hemorrhage. Vascular: No hyperdense vessel or unexpected calcification. Skull: Cranium is intact. Sinuses/Orbits: Mastoid air cells are clear. There is partial opacification of the left sphenoid sinus. Orbits are within normal limits. Other: Noncontributory CT CERVICAL SPINE FINDINGS Alignment: Straightening of the normally lordotic cervical spine. Slight anterolisthesis C3 upon C4 without dislocation. Skull base and vertebrae: No acute fracture.  No dislocation. Soft tissues and spinal canal: No obvious spinal hematoma. No obvious soft tissue hematoma. 1.7 cm nodule in the isthmus of the nodule cannot be excluded. Tiny metal objects in the posterior soft a Gus at the level of the thyroid most likely represent surgical staples. Disc levels: Posterior osteophytic ridging at C3-4 causes an element of left-sided central stenosis. There is advanced disc space narrowing at C4-5, C5-6, and C6-7 with posterior osteophytic ridging. This leads to varying degrees of spinal stenosis. Upper chest: Negative. Other: Aortic atherosclerotic calcification IMPRESSION: No acute intracranial pathology.  Chronic changes are noted. No acute cervical spine  injury.  Chronic changes are noted. Electronically Signed: By: Marybelle Killings M.D. On: 02/21/2018 15:20   Mr Brain Wo Contrast  Result Date: 02/21/2018 CLINICAL DATA:  Dizziness and unsteady gait. Two unwitnessed falls last night. EXAM: MRI HEAD WITHOUT CONTRAST TECHNIQUE: Multiplanar, multiecho pulse sequences of the brain and surrounding structures were  obtained without intravenous contrast. COMPARISON:  None. FINDINGS: Brain: Mild atrophy and white matter changes are likely within normal limits for age. No acute infarct, hemorrhage, or mass lesion is present. The ventricles are proportionate to the degree of atrophy. Brainstem and cerebellum are normal. The internal auditory canals are within normal limits bilaterally. Vascular: Flow is present in the major intracranial arteries. Skull and upper cervical spine: There is focal dural thickening over the right frontal convexity with subtle diffusion signal abnormality extending along the dural thickening and into the calvarium. 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 No other focal lesions are present to suggest metastatic disease. The lesion measures 2.3 x 2.1 x 2.7 cm. Craniocervical jungle scratched at the craniocervical junction is otherwise normal. Marrow signal is normal. Mild degenerative changes are evident at C3-4 and C4-5. Sinuses/Orbits: There is chronic opacification of the lateral recess of the left sphenoid sinus. Paranasal sinuses are otherwise clear. Bilateral lens replacements are present. Globes and orbits are within normal limits. IMPRESSION: 1. Atrophy and white matter changes are likely within normal limits for age. 2. No acute abnormality of the brain. 3. Dural-based lesion with intraosseous involvement and some restricted diffusion is most consistent with an intraosseous meningioma. There is no significant mass effect associated. 4. Chronic opacification involving the lateral recess of the left sphenoid sinus.  Electronically Signed   By: San Morelle M.D.   On: 02/21/2018 18:48    Procedures Procedures (including critical care time)  Medications Ordered in ED Medications  LORazepam (ATIVAN) tablet 0.5 mg (0.5 mg Oral Given 02/21/18 1726)  Tdap (BOOSTRIX) injection 0.5 mL (0.5 mLs Intramuscular Given 02/21/18 1519)  metoCLOPramide (REGLAN) injection 10 mg (10 mg Intravenous Given 02/21/18 1519)  sodium chloride 0.9 % bolus 1,000 mL (0 mLs Intravenous Stopped 02/21/18 1727)  sodium chloride 0.9 % bolus 1,000 mL (0 mLs Intravenous Stopped 02/21/18 2047)     Initial Impression / Assessment and Plan / ED Course  I have reviewed the triage vital signs and the nursing notes.  Pertinent labs & imaging results that were available during my care of the patient were reviewed by me and considered in my medical decision making (see chart for details).  Clinical Course as of Feb 21 2199  Wed Feb 21, 2018  1541 IMPRESSION: Cardiomegaly without edema. Bibasilar atelectasis. Possible tiny pleural effusions.  DG Chest 2 View [CG]  1157 FINDINGS: There is a small bony fragment just superior to the head of radius. This may represent an acute fracture fragment, donor site unclear. There is no dislocation.    DG Elbow Complete Right [CG]  1541 Re-evaluated pt.  Stood her up and walked her, she is unsteady and needs 2 person assist. Per caregiver, much different than baseline. During ambulation she reported worsening dizziness, kept closing her eyes and right sided chest pain.  Repeat EKG done without STE   [CG]  1636 Spoke with patient's son and discussed work-up so far.  She is orthostatic.  Recommending MRI and they are in agreement.   [CG]  1845 Leukocytes, UA(!): TRACE [CG]  1845 WBC, UA: 11-20 [CG]  2042 Discussed MRI findings with family and pt.  Ambulated pt again and she is unsteady still, continues to endorse dizziness when standing, almost fell sideways off the commode.  Will re-check  orthostatics, anticipate admission   [CG]  2153 [place] [CG]  2154 Skull and upper cervical spine: There is focal dural thickening over the right frontal convexity with  subtle diffusion signal abnormality extending along the dural thickening and into the calvarium. 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 No other focal lesions are present to suggest metastatic disease. The lesion measures 2.3 x 2.1 x 2.7 cm.     [CG]    Clinical Course User Index [CG] Kinnie Feil, PA-C    84 year old here with dizziness, unsteady gait and 2 unwitnessed falls.  At baseline she ambulates without assistance.  Considering peripheral versus central vertigo versus cardiac etiology versus anemia versus infection or orthostatics.   1550: I attempted to ambulate patient and she was very dizzy,unstead, needed two person assist. She developed nausea and grabbed her chest reporting chest pain.  Repeat EKG without any significant changes or arrhythmia.  She was on cardiac monitor and there were PVCs noted.  Positive orthostatics.  Imaging remarkable for possible small bony fragment at the head of the radius, will place in shoulder sling.  CT head/C-spine negative.  Chest x-ray without infiltrate.  Pending labs.  Son who is POA on route.  Will obtain MRI.   2153: MRI shows intraosseous meningioma without other focal lesions. This was not seen in MRI 2016.  No posterior stroke.  Trop x 2 undetectable. Repeat EKG w/o ischemia or arrhythmias.  No infection in CXR or UA.  Ambulated pt after 1 L IVF and she continues to be unsteady, needing to use walker and 1 person assist.  VS have slightly improved, mildly orthostatic when sitting>standing. Her dizziness does occur during movement making vertigo > orthostatics more likely.  Difficult exam due to dementia.  Spoke to hospitalist (Dr. Tamala Julian) who met with pt and family.  Recommended observation for gentle IVF however family declined  overnight observation.  Declined SW/CM, family not interested at this time to increase home health aid. It is risky to send pt home with meclazine due to sedating effects could put her at higher risk for fall.  Will defer this at this time. DC with oral hydration, close monitoring. Discussed return precautions.   Final Clinical Impressions(s) / ED Diagnoses   Final diagnoses:  Dizziness  Fall, initial encounter  Orthostatic hypotension  Laceration of right elbow, initial encounter    ED Discharge Orders    None       Arlean Hopping 02/21/18 2200    Virgel Manifold, MD 02/24/18 1150

## 2018-02-21 NOTE — Consult Note (Signed)
Patient was evaluated and case discussed with Sharen Heck, PA-C.   [name] is a 84 y.o. female with medical history significant of HTN,  HLD, hypothyroidism, and dementia; who presents after having 2 falls last night.  She complains of a frontal headache and dizziness.  Family reports headaches complaints have been chronic.  Patient has caregivers from 8 AM to 9 PM when she patient normally goes to sleep.  However last night patient had a fall while going to the bathroom around 9 PM.  Patient was helped up by the caregiver at that time and family came by to check on her shortly thereafter around 10-11 PM and found the patient sleeping.  Sometime thereafter patient had a second fall which she was found this morning at 8 AM by caregivers on the floor with a wound to her right elbow. Upon admission into the emergency department patient was seen to be afebrile with vital signs relatively within normal limits.  Initial orthostatics were positive, but after 1 L normal saline IV fluid orthostatics improved.  Lab work including CBC, CMP, lipase, and troponin x2 were relatively within normal limits.  Imaging of her chest showed cardiomegaly without significant signs of edema.  X-rays of the right elbow showed possible had a radius fracture. Patient was noted to still be unsteady and complaining of dizziness, but MRI of the brain did not show any acute abnormalities in the interosseous meningioma without mass-effect.  Patient was able to ambulate although noted to be significantly shaky.  Discussed with the patient's son regarding possible observation admission along with goals of care with the patient given age and dementia.  Offered possible need a social work consult for need of 24-hour care and/or placement, but family declined.  Utility of physical therapy for orthostatic hypotension precautions limited in patient with dementia.  Family reports previous issues with reminding patient to get up slowly.   Family reports wanting to take patient home at this time.

## 2018-02-21 NOTE — Discharge Instructions (Addendum)
You were seen in the ER after 2 unwitnessed falls and dizziness, nausea and vomiting.  We discussed results of your ER work-up today.  MRI found a small meningioma, most of these are benign.  Follow-up with your primary care doctor for further discussion of this.    Additionally, there is a tiny fragment of bone at the right elbow, this may or may not be a tiny avulsion fracture. Regardless, this is treated with immobilization, ice, elevation and pain control (tylenol or ibuprofen).  Follow up with your primary care doctor for wound re-check in 1 week and further revaluation of right elbow  Dizziness, nausea and vomiting may have been due to mild dehydration.  Stay well-hydrated.  We recommend 24/7 care as you are high risk for recurrent falls.  We recommended overnight observation for gentle IV fluid hydration however you declined this.  Return for worsening symptoms, changes in behavior, persistent dizziness, inability to walk, facial drooping, slurred speech, muscle weakness or heaviness.

## 2018-02-21 NOTE — ED Notes (Signed)
Patient transported to MRI 

## 2018-02-21 NOTE — ED Notes (Signed)
ED Provider at bedside. 

## 2018-02-21 NOTE — ED Notes (Signed)
In and Out cath performed, 900 mL urine emptied from bladder.

## 2018-02-21 NOTE — ED Triage Notes (Signed)
Pt's family reports pt fell last night before bed and fell sometime again during the night and she was found on the floor.  Pt has a caregiver from 8am-4pm everyday. Pt was found after 8am this morning in the floor. Pt has no one with her from 5pm-8am. Pt has been very lethargic and not wanting to get out of bed and has had dry heaving.  Pt has a hx of dementia.

## 2018-02-23 LAB — URINE CULTURE: Culture: >=100000 — AB

## 2018-03-01 DIAGNOSIS — H401131 Primary open-angle glaucoma, bilateral, mild stage: Secondary | ICD-10-CM | POA: Diagnosis not present

## 2018-04-11 ENCOUNTER — Encounter: Payer: Self-pay | Admitting: Adult Health

## 2018-04-19 ENCOUNTER — Telehealth: Payer: Self-pay | Admitting: Adult Health

## 2018-04-19 NOTE — Telephone Encounter (Signed)
Spoke with Dr. Lucia Gaskins. Pt can see Shanda Bumps NP.

## 2018-04-19 NOTE — Telephone Encounter (Signed)
Tried to call pt's son Rosanne Ashing. LVM asking for call back. When he calls back, will offer appt with Shanda Bumps NP for first available (if no new issues, only routine f/u) and then cancel appt with Dr. Lucia Gaskins in January if he is amenable to seeing Shanda Bumps instead.

## 2018-04-19 NOTE — Telephone Encounter (Signed)
Spoke with Rosanne Ashing (on DPR) and r/s pt for Thurs 11/14 @ 2:15 pm appt with Shanda Bumps NP for Dementia f/u. Canceled Jan 2020 appt. He verbalized appreciation. He stated the patient had another fall recently and they will need to place her. They are looking at facilities now.

## 2018-04-20 NOTE — Telephone Encounter (Signed)
Noted! Thank you

## 2018-04-23 ENCOUNTER — Ambulatory Visit: Payer: Medicare Other | Admitting: Adult Health

## 2018-04-26 ENCOUNTER — Ambulatory Visit: Payer: Medicare Other | Admitting: Adult Health

## 2018-04-26 ENCOUNTER — Encounter: Payer: Self-pay | Admitting: Adult Health

## 2018-04-26 VITALS — BP 147/66 | HR 66 | Wt 151.2 lb

## 2018-04-26 DIAGNOSIS — F0391 Unspecified dementia with behavioral disturbance: Secondary | ICD-10-CM

## 2018-04-26 NOTE — Progress Notes (Signed)
Personally  participated in, made any corrections needed, and agree with history, physical, neuro exam,assessment and plan as stated above.    Antonia Ahern, MD [facility] 

## 2018-04-26 NOTE — Patient Instructions (Addendum)
Your Plan:  Continue Ativan 0.5mg  and Zoloft 50mg  daily with continued follow up with psychiatry   Continue 24/7 aides at home for safety issues   Return to primary doctor to continued memory monitoring     Thank you for coming to see [facility] at [facility]. I hope we have been able to provide you high quality care today.  You may receive a patient satisfaction survey over the next few weeks. We would appreciate your feedback and comments so that we may continue to improve ourselves and the health of our patients.
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
## 2018-05-15 ENCOUNTER — Ambulatory Visit
Admission: RE | Admit: 2018-05-15 | Discharge: 2018-05-15 | Disposition: A | Discharge status: Home / Self Care | Payer: Medicare Other | Source: Ambulatory Visit | Attending: Family Medicine | Admitting: Family Medicine

## 2018-05-15 ENCOUNTER — Other Ambulatory Visit: Payer: Self-pay | Admitting: Family Medicine

## 2018-05-15 DIAGNOSIS — R52 Pain, unspecified: Secondary | ICD-10-CM

## 2018-05-15 DIAGNOSIS — S299XXA Unspecified injury of thorax, initial encounter: Secondary | ICD-10-CM | POA: Diagnosis not present

## 2018-05-15 DIAGNOSIS — I700 Atherosclerosis of aorta: Secondary | ICD-10-CM | POA: Diagnosis not present

## 2018-05-15 DIAGNOSIS — R0781 Pleurodynia: Secondary | ICD-10-CM | POA: Diagnosis not present

## 2018-05-15 DIAGNOSIS — H9201 Otalgia, right ear: Secondary | ICD-10-CM | POA: Diagnosis not present

## 2018-05-15 DIAGNOSIS — R0789 Other chest pain: Secondary | ICD-10-CM | POA: Diagnosis not present

## 2018-05-15 DIAGNOSIS — R109 Unspecified abdominal pain: Secondary | ICD-10-CM | POA: Diagnosis not present

## 2018-06-22 ENCOUNTER — Emergency Department (HOSPITAL_COMMUNITY): Payer: Medicare Other

## 2018-06-22 ENCOUNTER — Other Ambulatory Visit: Payer: Self-pay

## 2018-06-22 ENCOUNTER — Inpatient Hospital Stay (HOSPITAL_COMMUNITY)
Admission: EM | Admit: 2018-06-22 | Discharge: 2018-06-26 | DRG: 481 | Disposition: A | Discharge status: Skilled Nursing Facility | Payer: Medicare Other | Attending: Family Medicine | Admitting: Family Medicine

## 2018-06-22 ENCOUNTER — Encounter (HOSPITAL_COMMUNITY): Payer: Self-pay | Admitting: Emergency Medicine

## 2018-06-22 DIAGNOSIS — S72001A Fracture of unspecified part of neck of right femur, initial encounter for closed fracture: Secondary | ICD-10-CM

## 2018-06-22 DIAGNOSIS — Z79899 Other long term (current) drug therapy: Secondary | ICD-10-CM

## 2018-06-22 DIAGNOSIS — M2550 Pain in unspecified joint: Secondary | ICD-10-CM | POA: Diagnosis not present

## 2018-06-22 DIAGNOSIS — Z885 Allergy status to narcotic agent status: Secondary | ICD-10-CM

## 2018-06-22 DIAGNOSIS — K5900 Constipation, unspecified: Secondary | ICD-10-CM | POA: Diagnosis not present

## 2018-06-22 DIAGNOSIS — R52 Pain, unspecified: Secondary | ICD-10-CM | POA: Diagnosis not present

## 2018-06-22 DIAGNOSIS — Z7982 Long term (current) use of aspirin: Secondary | ICD-10-CM | POA: Diagnosis not present

## 2018-06-22 DIAGNOSIS — Z7401 Bed confinement status: Secondary | ICD-10-CM | POA: Diagnosis not present

## 2018-06-22 DIAGNOSIS — Z880 Allergy status to penicillin: Secondary | ICD-10-CM

## 2018-06-22 DIAGNOSIS — Z66 Do not resuscitate: Secondary | ICD-10-CM | POA: Diagnosis present

## 2018-06-22 DIAGNOSIS — S72354A Nondisplaced comminuted fracture of shaft of right femur, initial encounter for closed fracture: Secondary | ICD-10-CM | POA: Diagnosis not present

## 2018-06-22 DIAGNOSIS — W19XXXD Unspecified fall, subsequent encounter: Secondary | ICD-10-CM | POA: Diagnosis not present

## 2018-06-22 DIAGNOSIS — F0281 Dementia in other diseases classified elsewhere with behavioral disturbance: Secondary | ICD-10-CM | POA: Diagnosis not present

## 2018-06-22 DIAGNOSIS — W109XXA Fall (on) (from) unspecified stairs and steps, initial encounter: Secondary | ICD-10-CM | POA: Diagnosis present

## 2018-06-22 DIAGNOSIS — F329 Major depressive disorder, single episode, unspecified: Secondary | ICD-10-CM | POA: Diagnosis not present

## 2018-06-22 DIAGNOSIS — Z9049 Acquired absence of other specified parts of digestive tract: Secondary | ICD-10-CM

## 2018-06-22 DIAGNOSIS — Z882 Allergy status to sulfonamides status: Secondary | ICD-10-CM | POA: Diagnosis not present

## 2018-06-22 DIAGNOSIS — Z419 Encounter for procedure for purposes other than remedying health state, unspecified: Secondary | ICD-10-CM

## 2018-06-22 DIAGNOSIS — Z79890 Hormone replacement therapy (postmenopausal): Secondary | ICD-10-CM | POA: Diagnosis not present

## 2018-06-22 DIAGNOSIS — S72141D Displaced intertrochanteric fracture of right femur, subsequent encounter for closed fracture with routine healing: Secondary | ICD-10-CM | POA: Diagnosis not present

## 2018-06-22 DIAGNOSIS — S0990XA Unspecified injury of head, initial encounter: Secondary | ICD-10-CM | POA: Diagnosis not present

## 2018-06-22 DIAGNOSIS — R0902 Hypoxemia: Secondary | ICD-10-CM | POA: Diagnosis not present

## 2018-06-22 DIAGNOSIS — S72354D Nondisplaced comminuted fracture of shaft of right femur, subsequent encounter for closed fracture with routine healing: Secondary | ICD-10-CM | POA: Diagnosis not present

## 2018-06-22 DIAGNOSIS — Z888 Allergy status to other drugs, medicaments and biological substances status: Secondary | ICD-10-CM

## 2018-06-22 DIAGNOSIS — G309 Alzheimer's disease, unspecified: Secondary | ICD-10-CM | POA: Diagnosis present

## 2018-06-22 DIAGNOSIS — W19XXXA Unspecified fall, initial encounter: Secondary | ICD-10-CM | POA: Diagnosis not present

## 2018-06-22 DIAGNOSIS — F419 Anxiety disorder, unspecified: Secondary | ICD-10-CM | POA: Diagnosis not present

## 2018-06-22 DIAGNOSIS — F02818 Dementia in other diseases classified elsewhere, unspecified severity, with other behavioral disturbance: Secondary | ICD-10-CM | POA: Diagnosis present

## 2018-06-22 DIAGNOSIS — E038 Other specified hypothyroidism: Secondary | ICD-10-CM | POA: Diagnosis present

## 2018-06-22 DIAGNOSIS — S72141A Displaced intertrochanteric fracture of right femur, initial encounter for closed fracture: Secondary | ICD-10-CM | POA: Diagnosis not present

## 2018-06-22 DIAGNOSIS — S199XXA Unspecified injury of neck, initial encounter: Secondary | ICD-10-CM | POA: Diagnosis not present

## 2018-06-22 DIAGNOSIS — E119 Type 2 diabetes mellitus without complications: Secondary | ICD-10-CM | POA: Diagnosis present

## 2018-06-22 DIAGNOSIS — R404 Transient alteration of awareness: Secondary | ICD-10-CM | POA: Diagnosis not present

## 2018-06-22 DIAGNOSIS — E785 Hyperlipidemia, unspecified: Secondary | ICD-10-CM | POA: Diagnosis not present

## 2018-06-22 DIAGNOSIS — E46 Unspecified protein-calorie malnutrition: Secondary | ICD-10-CM | POA: Diagnosis not present

## 2018-06-22 DIAGNOSIS — Z5189 Encounter for other specified aftercare: Secondary | ICD-10-CM | POA: Diagnosis not present

## 2018-06-22 DIAGNOSIS — I10 Essential (primary) hypertension: Secondary | ICD-10-CM | POA: Diagnosis present

## 2018-06-22 DIAGNOSIS — M62838 Other muscle spasm: Secondary | ICD-10-CM | POA: Diagnosis not present

## 2018-06-22 DIAGNOSIS — M25551 Pain in right hip: Secondary | ICD-10-CM | POA: Diagnosis not present

## 2018-06-22 DIAGNOSIS — Z4789 Encounter for other orthopedic aftercare: Secondary | ICD-10-CM | POA: Diagnosis not present

## 2018-06-22 DIAGNOSIS — S72009A Fracture of unspecified part of neck of unspecified femur, initial encounter for closed fracture: Secondary | ICD-10-CM | POA: Diagnosis present

## 2018-06-22 DIAGNOSIS — H409 Unspecified glaucoma: Secondary | ICD-10-CM | POA: Diagnosis not present

## 2018-06-22 DIAGNOSIS — E039 Hypothyroidism, unspecified: Secondary | ICD-10-CM | POA: Diagnosis not present

## 2018-06-22 LAB — CBC WITH DIFFERENTIAL/PLATELET
ABS IMMATURE GRANULOCYTES: 0.07 10*3/uL (ref 0.00–0.07)
BASOS ABS: 0 10*3/uL (ref 0.0–0.1)
Basophils Relative: 0 %
EOS ABS: 0.1 10*3/uL (ref 0.0–0.5)
Eosinophils Relative: 1 %
HCT: 38.4 % (ref 36.0–46.0)
HEMOGLOBIN: 11.9 g/dL — AB (ref 12.0–15.0)
Immature Granulocytes: 1 %
LYMPHS ABS: 1.2 10*3/uL (ref 0.7–4.0)
LYMPHS PCT: 16 %
MCH: 28.3 pg (ref 26.0–34.0)
MCHC: 31 g/dL (ref 30.0–36.0)
MCV: 91.4 fL (ref 80.0–100.0)
MONOS PCT: 8 %
Monocytes Absolute: 0.6 10*3/uL (ref 0.1–1.0)
Neutro Abs: 5.9 10*3/uL (ref 1.7–7.7)
Neutrophils Relative %: 74 %
Platelets: 275 10*3/uL (ref 150–400)
RBC: 4.2 MIL/uL (ref 3.87–5.11)
RDW: 13.8 % (ref 11.5–15.5)
WBC: 7.9 10*3/uL (ref 4.0–10.5)
nRBC: 0 % (ref 0.0–0.2)

## 2018-06-22 LAB — BASIC METABOLIC PANEL
Anion gap: 9 (ref 5–15)
BUN: 25 mg/dL — AB (ref 8–23)
CHLORIDE: 103 mmol/L (ref 98–111)
CO2: 27 mmol/L (ref 22–32)
CREATININE: 0.9 mg/dL (ref 0.44–1.00)
Calcium: 8.9 mg/dL (ref 8.9–10.3)
GFR, EST NON AFRICAN AMERICAN: 56 mL/min — AB (ref >60)
Glucose, Bld: 120 mg/dL — ABNORMAL HIGH (ref 70–99)
POTASSIUM: 4.1 mmol/L (ref 3.5–5.1)
SODIUM: 139 mmol/L (ref 135–145)

## 2018-06-22 LAB — TSH: TSH: 3.949 u[IU]/mL (ref 0.350–4.500)

## 2018-06-22 MED ORDER — SODIUM CHLORIDE 0.9 % IV BOLUS
500.0000 mL | Freq: Once | INTRAVENOUS | Status: AC
  Administered 2018-06-22: 500 mL via INTRAVENOUS

## 2018-06-22 MED ORDER — ACETAMINOPHEN 650 MG RE SUPP: 650.0000 mg | Freq: Four times a day (QID) | RECTAL | Status: DC | PRN

## 2018-06-22 MED ORDER — ALBUTEROL SULFATE (2.5 MG/3ML) 0.083% IN NEBU: 2.5000 mg | INHALATION_SOLUTION | RESPIRATORY_TRACT | Status: DC | PRN

## 2018-06-22 MED ORDER — SENNOSIDES-DOCUSATE SODIUM 8.6-50 MG PO TABS
2.0000 | ORAL_TABLET | Freq: Two times a day (BID) | ORAL | Status: DC
  Administered 2018-06-22 – 2018-06-26 (×5): 2 via ORAL
  Filled 2018-06-22 (×5): qty 2

## 2018-06-22 MED ORDER — CLINDAMYCIN PHOSPHATE 900 MG/50ML IV SOLN
900.0000 mg | INTRAVENOUS | Status: AC
  Administered 2018-06-23: 900 mg via INTRAVENOUS
  Filled 2018-06-22: qty 50

## 2018-06-22 MED ORDER — POLYETHYLENE GLYCOL 3350 17 G PO PACK
17.0000 g | PACK | Freq: Every day | ORAL | Status: DC | PRN
  Administered 2018-06-26: 17 g via ORAL
  Filled 2018-06-22: qty 1

## 2018-06-22 MED ORDER — METHOCARBAMOL 500 MG PO TABS
500.0000 mg | ORAL_TABLET | Freq: Three times a day (TID) | ORAL | Status: DC
  Administered 2018-06-22 – 2018-06-26 (×10): 500 mg via ORAL
  Filled 2018-06-22 (×10): qty 1

## 2018-06-22 MED ORDER — FENTANYL CITRATE (PF) 100 MCG/2ML IJ SOLN
50.0000 ug | Freq: Once | INTRAMUSCULAR | Status: AC
  Administered 2018-06-22: 50 ug via INTRAVENOUS
  Filled 2018-06-22: qty 2

## 2018-06-22 MED ORDER — LATANOPROST 0.005 % OP SOLN
1.0000 [drp] | Freq: Every day | OPHTHALMIC | Status: DC
  Administered 2018-06-23 – 2018-06-25 (×3): 1 [drp] via OPHTHALMIC
  Filled 2018-06-22: qty 2.5

## 2018-06-22 MED ORDER — HYDRALAZINE HCL 20 MG/ML IJ SOLN: 10.0000 mg | Freq: Four times a day (QID) | INTRAMUSCULAR | Status: DC | PRN

## 2018-06-22 MED ORDER — SODIUM CHLORIDE 0.9% FLUSH: 3.0000 mL | INTRAVENOUS | Status: DC | PRN

## 2018-06-22 MED ORDER — SERTRALINE HCL 100 MG PO TABS
100.0000 mg | ORAL_TABLET | Freq: Every day | ORAL | Status: DC
  Administered 2018-06-24 – 2018-06-26 (×3): 100 mg via ORAL
  Filled 2018-06-22 (×3): qty 1

## 2018-06-22 MED ORDER — SODIUM CHLORIDE 0.9% FLUSH
3.0000 mL | Freq: Two times a day (BID) | INTRAVENOUS | Status: DC
  Administered 2018-06-25 – 2018-06-26 (×2): 3 mL via INTRAVENOUS

## 2018-06-22 MED ORDER — SODIUM CHLORIDE 0.9 % IV SOLN: 250.0000 mL | INTRAVENOUS | Status: DC | PRN

## 2018-06-22 MED ORDER — BISACODYL 10 MG RE SUPP: 10.0000 mg | Freq: Once | RECTAL | Status: DC

## 2018-06-22 MED ORDER — ONDANSETRON HCL 4 MG/2ML IJ SOLN
4.0000 mg | Freq: Four times a day (QID) | INTRAMUSCULAR | Status: DC | PRN
  Administered 2018-06-23 – 2018-06-24 (×5): 4 mg via INTRAVENOUS
  Filled 2018-06-22 (×5): qty 2

## 2018-06-22 MED ORDER — ACETAMINOPHEN 325 MG PO TABS
650.0000 mg | ORAL_TABLET | Freq: Four times a day (QID) | ORAL | Status: DC | PRN
  Administered 2018-06-22 – 2018-06-26 (×5): 650 mg via ORAL
  Filled 2018-06-22 (×6): qty 2

## 2018-06-22 MED ORDER — DEXTROSE-NACL 5-0.45 % IV SOLN
INTRAVENOUS | Status: DC
  Administered 2018-06-22 – 2018-06-24 (×4): via INTRAVENOUS

## 2018-06-22 MED ORDER — MORPHINE SULFATE (PF) 2 MG/ML IV SOLN
2.0000 mg | INTRAVENOUS | Status: DC | PRN
  Administered 2018-06-22 – 2018-06-24 (×8): 2 mg via INTRAVENOUS
  Filled 2018-06-22 (×8): qty 1

## 2018-06-22 MED ORDER — LORAZEPAM 0.5 MG PO TABS
0.5000 mg | ORAL_TABLET | Freq: Two times a day (BID) | ORAL | Status: DC
  Administered 2018-06-22 – 2018-06-26 (×6): 0.5 mg via ORAL
  Filled 2018-06-22 (×6): qty 1

## 2018-06-22 MED ORDER — HEPARIN SODIUM (PORCINE) 5000 UNIT/ML IJ SOLN
5000.0000 [IU] | Freq: Three times a day (TID) | INTRAMUSCULAR | Status: DC
  Administered 2018-06-23 – 2018-06-26 (×8): 5000 [IU] via SUBCUTANEOUS
  Filled 2018-06-22 (×9): qty 1

## 2018-06-22 MED ORDER — ADULT MULTIVITAMIN LIQUID CH
Freq: Every day | ORAL | Status: DC
  Administered 2018-06-25 – 2018-06-26 (×2): 15 mL via ORAL
  Filled 2018-06-22 (×4): qty 15

## 2018-06-22 MED ORDER — LEVOTHYROXINE SODIUM 88 MCG PO TABS
88.0000 ug | ORAL_TABLET | Freq: Every day | ORAL | Status: DC
  Administered 2018-06-24 – 2018-06-26 (×3): 88 ug via ORAL
  Filled 2018-06-22 (×3): qty 1

## 2018-06-22 MED ORDER — ONDANSETRON HCL 4 MG/2ML IJ SOLN
2.0000 mg | Freq: Once | INTRAMUSCULAR | Status: AC
  Administered 2018-06-22: 2 mg via INTRAVENOUS
  Filled 2018-06-22: qty 2

## 2018-06-22 MED ORDER — TRAZODONE HCL 50 MG PO TABS: 50.0000 mg | ORAL_TABLET | Freq: Every evening | ORAL | Status: DC | PRN

## 2018-06-22 MED ORDER — AMLODIPINE BESYLATE 10 MG PO TABS
10.0000 mg | ORAL_TABLET | Freq: Every day | ORAL | Status: DC
  Administered 2018-06-23 – 2018-06-25 (×3): 10 mg via ORAL
  Filled 2018-06-22 (×4): qty 1

## 2018-06-22 MED ORDER — LORAZEPAM 2 MG/ML IJ SOLN: 0.5000 mg | INTRAMUSCULAR | Status: DC | PRN

## 2018-06-22 NOTE — H&P (Signed)
Patient Demographics:    Colleen Huang, is a 85 y.o. female  MRN: [id number]   DOB - 02/20/1924  Admit Date - 06/22/2018  Outpatient Primary MD for the patient is Blair HeysEhinger, Robert, MD   Assessment & Plan:    Principal Problem:   Hip fracture [facility](HCC) Active Problems:   Benign essential HTN   Other specified hypothyroidism   Alzheimer's dementia with behavioral disturbance (HCC)    1)Rt Hip Fx--post mechanical fall--- this appears to be purely mechanical fall no evidence of syncope or other concerns, from a preop standpoint patient has no cardiovascular or respiratory complaints at this time, Creatinine 0.9, hemoglobin is 11.9 with white count of 7.9, platelets are 275, EKG not available at this time------ no significant contraindications to hip surgery identified at this time--- further management per orthopedic team and anesthesia team  2)HTN--stable, give amlodipine 10 mg daily,  may use IV Hydralazine 10 mg  Every 4 hours Prn for systolic blood pressure over 160 mmhg  3)Social/Ethics--- patient is a DNR/DNI, verified with patient's son Fayrene FearingJames at bedside, patient's other [place] is the primary decision maker  4) dementia with advanced cognitive deficits-------- patient may have sundowning may use lorazepam as needed   5) hypothyroidism--- check TSH, continue levothyroxine 88 mcg daily    With History of - Reviewed by me  Past Medical History:  Diagnosis Date  . Anxiety    anxiety with episodes of angry outbursts" feels as being treated as a child"  . Dementia (HCC)   . Diabetes mellitus without complication (HCC)    past history in Epic note from Neurlogy visit  . Falls   . Headache    recent neurology visit to evaluate headaches.  . Hearing loss    wears hearing aids  .  Memory difficulties    "repeats self alot"- has increasing levels of memory issues  . Pancreatitis 04/2015   [facility]      Past Surgical History:  Procedure Laterality Date  . APPENDECTOMY  1935  . CHOLECYSTECTOMY    . COLONOSCOPY  2005, 2009  . EUS N/A 09/18/2015   Procedure: UPPER ENDOSCOPIC ULTRASOUND (EUS) LINEAR;  Surgeon: Jeani HawkingPatrick Hung, MD;  Location: [facility] ENDOSCOPY;  Service: Endoscopy;  Laterality: N/A;  . FLEXIBLE SIGMOIDOSCOPY  2005  . GALLBLADDER SURGERY  1997  . PARATHYROIDECTOMY / EXPLORATION OF PARATHYROIDS  2003  . RECTAL POLYPECTOMY  2005  . TONSILLECTOMY  1949      Chief Complaint  Patient presents with  . Fall  . Hip Pain      HPI:    Colleen HailMarion Huang  is a 85 y.o. female with past medical history relevant for hypertension, advanced dementia with significant cognitive deficits at baseline as well as hypothyroidism who presents to the ED after falling----patient was ambulating with a caregiver as she was stepping down the curb she fell backwards----caregiver noticed hip  pain and right lower extremity deformity   this appears to be purely mechanical fall no evidence of syncope or other concerns... Obvious bleeding, no seizure concerns, no loss of consciousness, no incontinence  Patient is a very poor historian due to advanced dementia with significant cognitive deficits, most of the history is obtained from patient's son [name] at bedside as well as patient's female caregiver who was actually present when she fell  In ED--- CT head and CT of the C-spine without acute findings Hip and pelvic imaging studies demonstrate comminuted fracture of the right femoral neck--orthopedic consult from Dr. Susa Simmonds requested-  Plan is for surgical correction possibly on 06/23/2018--- patient's son is agreeable with plan  Patient son tells me that patient is a DNR/DNI  Patient apparently has intolerances to oral opiates will use IV  morphine sulfate IV fentanyl as needed  From a preop standpoint patient has no cardiovascular or respiratory complaints at this time Creatinine 0.9, hemoglobin is 11.9 with white count of 7.9, platelets are 275, EKG not available at this time     Review of systems:    In addition to the HPI above,   A full Review of  Systems was done, all other systems reviewed are negative except as noted above in HPI , .    Social History:  Reviewed by me    Social History   Tobacco Use  . Smoking status: Never Smoker  . Smokeless tobacco: Never Used  Substance Use Topics  . Alcohol use: No    Alcohol/week: 0.0 standard drinks       Family History :  Reviewed by me    Family History  Problem Relation Age of Onset  . Dementia Brother      Home Medications:   Prior to Admission medications   Medication Sig Start Date End Date Taking? Authorizing Provider  amLODipine (NORVASC) 10 MG tablet Take 10 mg by mouth at bedtime.  04/09/16  Yes [provider]  latanoprost (XALATAN) 0.005 % ophthalmic solution Place 1 drop into both eyes at bedtime.    Yes [provider]  levothyroxine (SYNTHROID, LEVOTHROID) 88 MCG tablet Take 88 mcg by mouth daily.  03/19/17  Yes [provider]  LORazepam (ATIVAN) 0.5 MG tablet Take 0.5 mg by mouth 2 (two) times daily.  03/24/16  Yes [provider]  Multiple Vitamins-Minerals (MULTIVITAMIN PO) Take 1 tablet by mouth daily.    Yes [provider]  sertraline (ZOLOFT) 100 MG tablet Take 100 mg by mouth daily.  03/16/18  Yes [provider]  triamcinolone cream (KENALOG) 0.1 % Apply 1 application topically 2 (two) times daily as needed. 05/15/18  Yes [provider]  sertraline (ZOLOFT) 50 MG tablet Take 1 tablet (50 mg total) by mouth daily. Patient not taking: Reported on 06/22/2018 10/10/17   Anson Fret, MD     Allergies:     Allergies  Allergen Reactions  . Codeine Other (See  Comments)    Really bad reaction - that's all she can remember   . Hydrocodone Other (See Comments)    Really bad reaction - that's all she can remember   . Penicillins Itching, Swelling and Rash    Has patient had a PCN reaction causing immediate rash, facial/tongue/throat swelling, SOB or lightheadedness with hypotension: Yes  Has patient had a PCN reaction causing severe rash involving mucus membranes or skin necrosis: Yes  Has patient had a PCN reaction that required hospitalization: No  Has patient  had a PCN reaction occurring within the last 10 years: No  If all of the above answers are "NO", then may proceed with Cephalosporin use.   [name] Percocet [Oxycodone-Acetaminophen] Other (See Comments)    Unknown  . Sulfa Antibiotics Other (See Comments)    Unknown reaction      Physical Exam:   Vitals  Blood pressure (!) 133/55, pulse 71, temperature (!) 97.5 F (36.4 C), temperature source Oral, resp. rate 18, SpO2 96 %.  Physical Examination: General appearance -awake, elderly and in no distress Mental status - alert, oriented to person, disoriented to place , baseline cognitive deficits eyes - sclera anicteric Neck - supple, no JVD elevation , Chest - clear  to auscultation bilaterally, symmetrical air movement,  Heart - S1 and S2 normal, regular  Abdomen - soft, nontender, nondistended, no masses or organomegaly Neurological - screening mental status exam normal, neck supple without rigidity, cranial nerves II through XII intact, DTR's normal and symmetric Extremities - no pedal edema noted, intact peripheral pulses  Skin - warm, dry MSK--right lower extremity is shortened and externally rotated    Data Review:    CBC Recent Labs  Lab 06/22/18 1507  WBC 7.9  HGB 11.9*  HCT 38.4  PLT 275  MCV 91.4  MCH 28.3  MCHC 31.0  RDW 13.8  LYMPHSABS 1.2  MONOABS 0.6  EOSABS 0.1  BASOSABS 0.0    ------------------------------------------------------------------------------------------------------------------  Chemistries  Recent Labs  Lab 06/22/18 1507  NA 139  K 4.1  CL 103  CO2 27  GLUCOSE 120*  BUN 25*  CREATININE 0.90  CALCIUM 8.9   ---------------------------------------------------------------------------------------------------------------  Urinalysis    Component Value Date/Time   COLORURINE YELLOW 02/21/2018 1700   APPEARANCEUR CLEAR 02/21/2018 1700   LABSPEC 1.006 02/21/2018 1700   PHURINE 8.0 02/21/2018 1700   GLUCOSEU NEGATIVE 02/21/2018 1700   HGBUR NEGATIVE 02/21/2018 1700   BILIRUBINUR NEGATIVE 02/21/2018 1700   KETONESUR NEGATIVE 02/21/2018 1700   PROTEINUR NEGATIVE 02/21/2018 1700   NITRITE NEGATIVE 02/21/2018 1700   LEUKOCYTESUR TRACE (A) 02/21/2018 1700    ----------------------------------------------------------------------------------------------------------------   Imaging Results:    Ct Head Wo Contrast  Result Date: 06/22/2018 CLINICAL DATA:  Fell onto asphalt while attempting to step up onto a curb minor head trauma, high clinical risk; cervical spine trauma, high clinical risk; history dementia, diabetes mellitus, hypertension EXAM: CT HEAD WITHOUT CONTRAST CT CERVICAL SPINE WITHOUT CONTRAST TECHNIQUE: Multidetector CT imaging of the head and cervical spine was performed following the standard protocol without intravenous contrast. Multiplanar CT image reconstructions of the cervical spine were also generated. COMPARISON:  02/21/2018 FINDINGS: CT HEAD FINDINGS Brain: Generalized atrophy. Normal ventricular morphology. No midline shift or mass effect. Small vessel chronic ischemic changes of deep cerebral white matter. No intracranial hemorrhage, mass lesion, evidence of acute infarction, or extra-axial fluid collection. Vascular: Atherosclerotic calcifications of internal carotid arteries at skull base Skull: Demineralized but intact  Sinuses/Orbits: Partial opacification of LEFT sphenoid sinus, chronic. Remaining visualized paranasal sinuses and mastoid air cells clear Other: N/A CT CERVICAL SPINE FINDINGS Alignment: Normal Skull base and vertebrae: Diffuse osseous demineralization. Skull base intact. Multilevel disc space narrowing and endplate spur formation. Vertebral body heights maintained without fracture or bone destruction. Scattered facet degenerative changes bilaterally. Encroachment upon cervical neural foramina bilaterally by uncovertebral spurs. Soft tissues and spinal canal: Prevertebral soft tissues normal thickness. Atherosclerotic calcifications at carotid bifurcations. Small isthmic thyroid nodule 16 mm diameter unchanged Disc levels:  No additional abnormalities Upper chest: Lung  apices clear Other: Atherosclerotic calcifications aortic arch. IMPRESSION: Atrophy with small vessel chronic ischemic changes of deep cerebral white matter. No acute intracranial abnormalities. Degenerative disc and facet disease changes of the cervical spine. No acute cervical spine abnormalities. Stable isthmic thyroid nodule. Aortic Atherosclerosis (ICD10-I70.0). Electronically Signed   By: Ulyses SouthwardMark  Boles M.D.   On: 06/22/2018 15:51   Ct Cervical Spine Wo Contrast  Result Date: 06/22/2018 CLINICAL DATA:  Larey SeatFell onto asphalt while attempting to step up onto a curb minor head trauma, high clinical risk; cervical spine trauma, high clinical risk; history dementia, diabetes mellitus, hypertension EXAM: CT HEAD WITHOUT CONTRAST CT CERVICAL SPINE WITHOUT CONTRAST TECHNIQUE: Multidetector CT imaging of the head and cervical spine was performed following the standard protocol without intravenous contrast. Multiplanar CT image reconstructions of the cervical spine were also generated. COMPARISON:  02/21/2018 FINDINGS: CT HEAD FINDINGS Brain: Generalized atrophy. Normal ventricular morphology. No midline shift or mass effect. Small vessel chronic ischemic  changes of deep cerebral white matter. No intracranial hemorrhage, mass lesion, evidence of acute infarction, or extra-axial fluid collection. Vascular: Atherosclerotic calcifications of internal carotid arteries at skull base Skull: Demineralized but intact Sinuses/Orbits: Partial opacification of LEFT sphenoid sinus, chronic. Remaining visualized paranasal sinuses and mastoid air cells clear Other: N/A CT CERVICAL SPINE FINDINGS Alignment: Normal Skull base and vertebrae: Diffuse osseous demineralization. Skull base intact. Multilevel disc space narrowing and endplate spur formation. Vertebral body heights maintained without fracture or bone destruction. Scattered facet degenerative changes bilaterally. Encroachment upon cervical neural foramina bilaterally by uncovertebral spurs. Soft tissues and spinal canal: Prevertebral soft tissues normal thickness. Atherosclerotic calcifications at carotid bifurcations. Small isthmic thyroid nodule 16 mm diameter unchanged Disc levels:  No additional abnormalities Upper chest: Lung apices clear Other: Atherosclerotic calcifications aortic arch. IMPRESSION: Atrophy with small vessel chronic ischemic changes of deep cerebral white matter. No acute intracranial abnormalities. Degenerative disc and facet disease changes of the cervical spine. No acute cervical spine abnormalities. Stable isthmic thyroid nodule. Aortic Atherosclerosis (ICD10-I70.0). Electronically Signed   By: Ulyses SouthwardMark  Boles M.D.   On: 06/22/2018 15:51   Dg Hip Unilat  With Pelvis 2-3 Views Right  Result Date: 06/22/2018 CLINICAL DATA:  Acute RIGHT hip pain following fall today. Initial encounter. EXAM: DG HIP (WITH OR WITHOUT PELVIS) 2-3V RIGHT COMPARISON:  None. FINDINGS: A comminuted intertrochanteric RIGHT femur fracture is noted with varus angulation. No dislocation. No other fractures are noted. IMPRESSION: Comminuted intertrochanteric RIGHT femur fracture with varus angulation. Electronically Signed   By:  Harmon PierJeffrey  Hu M.D.   On: 06/22/2018 17:04   Dg Femur Min 2 Views Right  Result Date: 06/22/2018 CLINICAL DATA:  Acute RIGHT leg pain following fall. Initial encounter. EXAM: RIGHT FEMUR 2 VIEWS COMPARISON:  None. FINDINGS: A comminuted intertrochanteric femur fracture is noted with varus angulation. No other fracture identified. No dislocation. Degenerative changes in the knee are present. IMPRESSION: Comminuted intertrochanteric RIGHT femur fracture with varus angulation. Electronically Signed   By: Harmon PierJeffrey  Hu M.D.   On: 06/22/2018 17:05    Radiological Exams on Admission: Ct Head Wo Contrast  Result Date: 06/22/2018 CLINICAL DATA:  Larey SeatFell onto asphalt while attempting to step up onto a curb minor head trauma, high clinical risk; cervical spine trauma, high clinical risk; history dementia, diabetes mellitus, hypertension EXAM: CT HEAD WITHOUT CONTRAST CT CERVICAL SPINE WITHOUT CONTRAST TECHNIQUE: Multidetector CT imaging of the head and cervical spine was performed following the standard protocol without intravenous contrast. Multiplanar CT image reconstructions of the cervical spine  were also generated. COMPARISON:  02/21/2018 FINDINGS: CT HEAD FINDINGS Brain: Generalized atrophy. Normal ventricular morphology. No midline shift or mass effect. Small vessel chronic ischemic changes of deep cerebral white matter. No intracranial hemorrhage, mass lesion, evidence of acute infarction, or extra-axial fluid collection. Vascular: Atherosclerotic calcifications of internal carotid arteries at skull base Skull: Demineralized but intact Sinuses/Orbits: Partial opacification of LEFT sphenoid sinus, chronic. Remaining visualized paranasal sinuses and mastoid air cells clear Other: N/A CT CERVICAL SPINE FINDINGS Alignment: Normal Skull base and vertebrae: Diffuse osseous demineralization. Skull base intact. Multilevel disc space narrowing and endplate spur formation. Vertebral body heights maintained without fracture or  bone destruction. Scattered facet degenerative changes bilaterally. Encroachment upon cervical neural foramina bilaterally by uncovertebral spurs. Soft tissues and spinal canal: Prevertebral soft tissues normal thickness. Atherosclerotic calcifications at carotid bifurcations. Small isthmic thyroid nodule 16 mm diameter unchanged Disc levels:  No additional abnormalities Upper chest: Lung apices clear Other: Atherosclerotic calcifications aortic arch. IMPRESSION: Atrophy with small vessel chronic ischemic changes of deep cerebral white matter. No acute intracranial abnormalities. Degenerative disc and facet disease changes of the cervical spine. No acute cervical spine abnormalities. Stable isthmic thyroid nodule. Aortic Atherosclerosis (ICD10-I70.0). Electronically Signed   By: Ulyses Southward M.D.   On: 06/22/2018 15:51   Ct Cervical Spine Wo Contrast  Result Date: 06/22/2018 CLINICAL DATA:  Larey Seat onto asphalt while attempting to step up onto a curb minor head trauma, high clinical risk; cervical spine trauma, high clinical risk; history dementia, diabetes mellitus, hypertension EXAM: CT HEAD WITHOUT CONTRAST CT CERVICAL SPINE WITHOUT CONTRAST TECHNIQUE: Multidetector CT imaging of the head and cervical spine was performed following the standard protocol without intravenous contrast. Multiplanar CT image reconstructions of the cervical spine were also generated. COMPARISON:  02/21/2018 FINDINGS: CT HEAD FINDINGS Brain: Generalized atrophy. Normal ventricular morphology. No midline shift or mass effect. Small vessel chronic ischemic changes of deep cerebral white matter. No intracranial hemorrhage, mass lesion, evidence of acute infarction, or extra-axial fluid collection. Vascular: Atherosclerotic calcifications of internal carotid arteries at skull base Skull: Demineralized but intact Sinuses/Orbits: Partial opacification of LEFT sphenoid sinus, chronic. Remaining visualized paranasal sinuses and mastoid air cells  clear Other: N/A CT CERVICAL SPINE FINDINGS Alignment: Normal Skull base and vertebrae: Diffuse osseous demineralization. Skull base intact. Multilevel disc space narrowing and endplate spur formation. Vertebral body heights maintained without fracture or bone destruction. Scattered facet degenerative changes bilaterally. Encroachment upon cervical neural foramina bilaterally by uncovertebral spurs. Soft tissues and spinal canal: Prevertebral soft tissues normal thickness. Atherosclerotic calcifications at carotid bifurcations. Small isthmic thyroid nodule 16 mm diameter unchanged Disc levels:  No additional abnormalities Upper chest: Lung apices clear Other: Atherosclerotic calcifications aortic arch. IMPRESSION: Atrophy with small vessel chronic ischemic changes of deep cerebral white matter. No acute intracranial abnormalities. Degenerative disc and facet disease changes of the cervical spine. No acute cervical spine abnormalities. Stable isthmic thyroid nodule. Aortic Atherosclerosis (ICD10-I70.0). Electronically Signed   By: Ulyses Southward M.D.   On: 06/22/2018 15:51   Dg Hip Unilat  With Pelvis 2-3 Views Right  Result Date: 06/22/2018 CLINICAL DATA:  Acute RIGHT hip pain following fall today. Initial encounter. EXAM: DG HIP (WITH OR WITHOUT PELVIS) 2-3V RIGHT COMPARISON:  None. FINDINGS: A comminuted intertrochanteric RIGHT femur fracture is noted with varus angulation. No dislocation. No other fractures are noted. IMPRESSION: Comminuted intertrochanteric RIGHT femur fracture with varus angulation. Electronically Signed   By: Harmon Pier M.D.   On: 06/22/2018 17:04   Dg Femur  Min 2 Views Right  Result Date: 06/22/2018 CLINICAL DATA:  Acute RIGHT leg pain following fall. Initial encounter. EXAM: RIGHT FEMUR 2 VIEWS COMPARISON:  None. FINDINGS: A comminuted intertrochanteric femur fracture is noted with varus angulation. No other fracture identified. No dislocation. Degenerative changes in the knee are  present. IMPRESSION: Comminuted intertrochanteric RIGHT femur fracture with varus angulation. Electronically Signed   By: Harmon Pier M.D.   On: 06/22/2018 17:05    DVT Prophylaxis -SCD --subcu heparin postop all other DVT prophylaxis measures determined by Ortho team AM Labs Ordered, also please review Full Orders  Family Communication: Admission, patients condition and plan of care including tests being ordered have been discussed with the patient and son [name]) who indicate understanding and agree with the plan   Code Status - DNR (verified with patient's son [name] at bedside) Likely DC to home  Condition  -stable  Shon Hale M.D on 06/22/2018 at 8:28 PM Go to [email or web address] -  for contact info  [facility] - Office  [phone]

## 2018-06-22 NOTE — ED Notes (Signed)
Bed: [facility] Expected date:  Expected time:  Means of arrival:  Comments: EMD-fall-hip injury-

## 2018-06-22 NOTE — Consult Note (Signed)
85 year old female status post ground-level fall with a right intertrochanteric femur fracture.  Was called by the ER due to radiographic findings.  I reviewed the images.  Patient is indicated for cephalo-medullary fixation of her intertrochanteric femur fracture.  We will plan for surgical fixation in the morning on 12/22/2018 after medical optimization by hospitalist team.  Once patient arrives to her hospital room will call to discuss with the family if present.  Bedrest for now N.p.o. past midnight

## 2018-06-22 NOTE — ED Notes (Signed)
Hospitalist at bedside 

## 2018-06-22 NOTE — ED Triage Notes (Signed)
Patient fell foward on asphalt while attempting to step up onto curb. It is believed that she fell on her hands and arms. She now complains of right hip pain. She is currently at baseline. EMS gave the patient a total of Fentanyl and 4mg  of Zofran.  EMS vitals: 128/56- BP  76- HR 98% on 2L- O2 sats

## 2018-06-22 NOTE — ED Notes (Signed)
Bed: [facility] Expected date:  Expected time:  Means of arrival:  Comments: 

## 2018-06-22 NOTE — ED Notes (Signed)
ED TO INPATIENT HANDOFF REPORT  Name/Age/Gender Colleen KaufmanMarion S Huang 85 y.o. female  Code Status Code Status History    Date Active Date Inactive Code Status Order ID Comments User Context   08/31/2015 0645 09/02/2015 1857 DNR [id number]  Colleen Huang, Arshad N, MD Inpatient   08/31/2015 [phone] 08/31/2015 0644 Full Code [id number]  Colleen Huang, Arshad N, MD ED   05/18/2015 0828 05/19/2015 1655 Full Code [id number]  Colleen Huang, Alma M, MD Inpatient   05/14/2015 1215 05/17/2015 2113 Full Code [id number]  Colleen Huang, Nishant, MD Inpatient    Questions for Most Recent Historical Code Status (Order [id number])    Question Answer Comment   In the event of cardiac or respiratory ARREST Do not call a "code blue"    In the event of cardiac or respiratory ARREST Do not perform Intubation, CPR, defibrillation or ACLS    In the event of cardiac or respiratory ARREST Use medication by any route, position, wound care, and other measures to relive pain and suffering. May use oxygen, suction and manual treatment of airway obstruction as needed for comfort.       Home/SNF/Other Home  Chief Complaint Fall; Hip Pain  Level of Care/Admitting Diagnosis ED Disposition    ED Disposition Condition Comment   Admit  [facility][id number]  Level of Care: Med-Surg [16]  Diagnosis: Hip fracture [facility](HCC) [[id number]]) [[id number]]  Admitting Physician: Colleen IshiharaEMOKPAE, COURAGE [[id number]]  Attending Physician: Colleen IshiharaEMOKPAE, COURAGE [[id number]]  Estimated length of stay: 3 - 4 days  Certification:: I certify this patient will need inpatient services for at least 2 midnights  PT Class (Do Not Modify): Inpatient [101]  PT Acc Code (Do Not Modify): Private [1]       Medical History Past Medical History:  Diagnosis Date  . Anxiety    anxiety with episodes of angry outbursts" feels as being treated as a child"  . Dementia (HCC)   . Diabetes mellitus without complication (HCC)    past history in Epic note from Neurlogy visit  .  Falls   . Headache    recent neurology visit to evaluate headaches.  . Hearing loss    wears hearing aids  . Memory difficulties    "repeats self alot"- has increasing levels of memory issues  . Pancreatitis 04/2015   [facility]    Allergies Allergies  Allergen Reactions  . Codeine Other (See Comments)    Really bad reaction - that's all she can remember   . Hydrocodone Other (See Comments)    Really bad reaction - that's all she can remember   . Penicillins Itching, Swelling and Rash    Has patient had a PCN reaction causing immediate rash, facial/tongue/throat swelling, SOB or lightheadedness with hypotension: Yes  Has patient had a PCN reaction causing severe rash involving mucus membranes or skin necrosis: Yes  Has patient had a PCN reaction that required hospitalization: No  Has patient had a PCN reaction occurring within the last 10 years: No  If all of the above answers are "NO", then may proceed with Cephalosporin use.   [name]. Percocet [Oxycodone-Acetaminophen] Other (See Comments)    Unknown  . Sulfa Antibiotics Other (See Comments)    Unknown reaction     IV Location/Drains/Wounds Patient Lines/Drains/Airways Status   Active Line/Drains/Airways    Name:   Placement date:   Placement time:   Site:   Days:   Peripheral IV 06/22/18 Left Antecubital   06/22/18    -  Antecubital   less than 1   AIRWAYS   09/18/15    1207     1008   AIRWAYS   09/18/15    1230     1008          Labs/Imaging Results for orders placed or performed during the hospital encounter of 06/22/18 (from the past 48 hour(s))  CBC with Differential     Status: Abnormal   Collection Time: 06/22/18  3:07 PM  Result Value Ref Range   WBC 7.9 4.0 - 10.5 K/uL   RBC 4.20 3.87 - 5.11 MIL/uL   Hemoglobin 11.9 (L) 12.0 - 15.0 g/dL   HCT 95.6 21.3 - 08.6 %   MCV 91.4 80.0 - 100.0 fL   MCH 28.3 26.0 - 34.0 pg   MCHC 31.0 30.0 - 36.0 g/dL   RDW 57.8 46.9 - 62.9  %   Platelets 275 150 - 400 K/uL   nRBC 0.0 0.0 - 0.2 %   Neutrophils Relative % 74 %   Neutro Abs 5.9 1.7 - 7.7 K/uL   Lymphocytes Relative 16 %   Lymphs Abs 1.2 0.7 - 4.0 K/uL   Monocytes Relative 8 %   Monocytes Absolute 0.6 0.1 - 1.0 K/uL   Eosinophils Relative 1 %   Eosinophils Absolute 0.1 0.0 - 0.5 K/uL   Basophils Relative 0 %   Basophils Absolute 0.0 0.0 - 0.1 K/uL   Immature Granulocytes 1 %   Abs Immature Granulocytes 0.07 0.00 - 0.07 K/uL    Comment: Performed at [facility], [address] [place], [place] [address]  Basic metabolic panel     Status: Abnormal   Collection Time: 06/22/18  3:07 PM  Result Value Ref Range   Sodium 139 135 - 145 mmol/L   Potassium 4.1 3.5 - 5.1 mmol/L   Chloride 103 98 - 111 mmol/L   CO2 27 22 - 32 mmol/L   Glucose, Bld 120 (H) 70 - 99 mg/dL   BUN 25 (H) 8 - 23 mg/dL   Creatinine, Ser 3.24 0.44 - 1.00 mg/dL   Calcium 8.9 8.9 - 40.1 mg/dL   GFR calc non Af Amer 56 (L) >60 mL/min   GFR calc Af Amer >60 >60 mL/min   Anion gap 9 5 - 15    Comment: Performed at [facility], [address] [place], [place] [address]   Ct Head Wo Contrast  Result Date: 06/22/2018 CLINICAL DATA:  Larey Seat onto asphalt while attempting to step up onto a curb minor head trauma, high clinical risk; cervical spine trauma, high clinical risk; history dementia, diabetes mellitus, hypertension EXAM: CT HEAD WITHOUT CONTRAST CT CERVICAL SPINE WITHOUT CONTRAST TECHNIQUE: Multidetector CT imaging of the head and cervical spine was performed following the standard protocol without intravenous contrast. Multiplanar CT image reconstructions of the cervical spine were also generated. COMPARISON:  02/21/2018 FINDINGS: CT HEAD FINDINGS Brain: Generalized atrophy. Normal ventricular morphology. No midline shift or mass effect. Small vessel chronic ischemic changes of deep cerebral white matter. No intracranial hemorrhage, mass lesion, evidence  of acute infarction, or extra-axial fluid collection. Vascular: Atherosclerotic calcifications of internal carotid arteries at skull base Skull: Demineralized but intact Sinuses/Orbits: Partial opacification of LEFT sphenoid sinus, chronic. Remaining visualized paranasal sinuses and mastoid air cells clear Other: N/A CT CERVICAL SPINE FINDINGS Alignment: Normal Skull base and vertebrae: Diffuse osseous demineralization. Skull base intact. Multilevel disc space narrowing and endplate spur formation. Vertebral body heights maintained without fracture  or bone destruction. Scattered facet degenerative changes bilaterally. Encroachment upon cervical neural foramina bilaterally by uncovertebral spurs. Soft tissues and spinal canal: Prevertebral soft tissues normal thickness. Atherosclerotic calcifications at carotid bifurcations. Small isthmic thyroid nodule 16 mm diameter unchanged Disc levels:  No additional abnormalities Upper chest: Lung apices clear Other: Atherosclerotic calcifications aortic arch. IMPRESSION: Atrophy with small vessel chronic ischemic changes of deep cerebral white matter. No acute intracranial abnormalities. Degenerative disc and facet disease changes of the cervical spine. No acute cervical spine abnormalities. Stable isthmic thyroid nodule. Aortic Atherosclerosis (ICD10-I70.0). Electronically Signed   By: Ulyses SouthwardMark  Boles M.D.   On: 06/22/2018 15:51   Ct Cervical Spine Wo Contrast  Result Date: 06/22/2018 CLINICAL DATA:  Larey SeatFell onto asphalt while attempting to step up onto a curb minor head trauma, high clinical risk; cervical spine trauma, high clinical risk; history dementia, diabetes mellitus, hypertension EXAM: CT HEAD WITHOUT CONTRAST CT CERVICAL SPINE WITHOUT CONTRAST TECHNIQUE: Multidetector CT imaging of the head and cervical spine was performed following the standard protocol without intravenous contrast. Multiplanar CT image reconstructions of the cervical spine were also generated.  COMPARISON:  02/21/2018 FINDINGS: CT HEAD FINDINGS Brain: Generalized atrophy. Normal ventricular morphology. No midline shift or mass effect. Small vessel chronic ischemic changes of deep cerebral white matter. No intracranial hemorrhage, mass lesion, evidence of acute infarction, or extra-axial fluid collection. Vascular: Atherosclerotic calcifications of internal carotid arteries at skull base Skull: Demineralized but intact Sinuses/Orbits: Partial opacification of LEFT sphenoid sinus, chronic. Remaining visualized paranasal sinuses and mastoid air cells clear Other: N/A CT CERVICAL SPINE FINDINGS Alignment: Normal Skull base and vertebrae: Diffuse osseous demineralization. Skull base intact. Multilevel disc space narrowing and endplate spur formation. Vertebral body heights maintained without fracture or bone destruction. Scattered facet degenerative changes bilaterally. Encroachment upon cervical neural foramina bilaterally by uncovertebral spurs. Soft tissues and spinal canal: Prevertebral soft tissues normal thickness. Atherosclerotic calcifications at carotid bifurcations. Small isthmic thyroid nodule 16 mm diameter unchanged Disc levels:  No additional abnormalities Upper chest: Lung apices clear Other: Atherosclerotic calcifications aortic arch. IMPRESSION: Atrophy with small vessel chronic ischemic changes of deep cerebral white matter. No acute intracranial abnormalities. Degenerative disc and facet disease changes of the cervical spine. No acute cervical spine abnormalities. Stable isthmic thyroid nodule. Aortic Atherosclerosis (ICD10-I70.0). Electronically Signed   By: Ulyses SouthwardMark  Boles M.D.   On: 06/22/2018 15:51   Dg Hip Unilat  With Pelvis 2-3 Views Right  Result Date: 06/22/2018 CLINICAL DATA:  Acute RIGHT hip pain following fall today. Initial encounter. EXAM: DG HIP (WITH OR WITHOUT PELVIS) 2-3V RIGHT COMPARISON:  None. FINDINGS: A comminuted intertrochanteric RIGHT femur fracture is noted with  varus angulation. No dislocation. No other fractures are noted. IMPRESSION: Comminuted intertrochanteric RIGHT femur fracture with varus angulation. Electronically Signed   By: Harmon PierJeffrey  Hu M.D.   On: 06/22/2018 17:04   Dg Femur Min 2 Views Right  Result Date: 06/22/2018 CLINICAL DATA:  Acute RIGHT leg pain following fall. Initial encounter. EXAM: RIGHT FEMUR 2 VIEWS COMPARISON:  None. FINDINGS: A comminuted intertrochanteric femur fracture is noted with varus angulation. No other fracture identified. No dislocation. Degenerative changes in the knee are present. IMPRESSION: Comminuted intertrochanteric RIGHT femur fracture with varus angulation. Electronically Signed   By: Harmon PierJeffrey  Hu M.D.   On: 06/22/2018 17:05   None  Pending Labs Unresulted Labs (From admission, onward)   None      Vitals/Pain Today's Vitals   06/22/18 1730 06/22/18 1736 06/22/18 1800 06/22/18 1830  BP: [name](!)  125/49 (!) 131/50 (!) 115/52 123/76  Pulse: 66 67 67   Resp: 11 (!) 22 18 17   Temp:      TempSrc:      SpO2: 100% 98% 99%   PainSc:        Isolation Precautions No active isolations  Medications Medications  fentaNYL (SUBLIMAZE) injection 50 mcg (50 mcg Intravenous Given 06/22/18 1516)  sodium chloride 0.9 % bolus 500 mL (0 mLs Intravenous Stopped 06/22/18 1921)  ondansetron (ZOFRAN) injection 2 mg (2 mg Intravenous Given 06/22/18 1648)  fentaNYL (SUBLIMAZE) injection 50 mcg (50 mcg Intravenous Given 06/22/18 1650)  fentaNYL (SUBLIMAZE) injection 50 mcg (50 mcg Intravenous Given 06/22/18 1738)    Mobility walks with person assist

## 2018-06-22 NOTE — ED Notes (Signed)
Attempted report call to RN 3W, stated to call back after shift change.

## 2018-06-22 NOTE — ED Notes (Signed)
Family at bedside. 

## 2018-06-22 NOTE — ED Notes (Signed)
Transport called for patient to go upstairs 

## 2018-06-22 NOTE — ED Notes (Signed)
Patient transported to CT 

## 2018-06-22 NOTE — ED Notes (Signed)
Pt requested to use the restroom. A purewick was placed after being explained to pt and family.

## 2018-06-22 NOTE — ED Notes (Signed)
ED Provider at bedside. 

## 2018-06-22 NOTE — ED Provider Notes (Signed)
[facility]-EMERGENCY DEPT Provider Note   CSN: [id number] Arrival date & time: 06/22/18  1414     History   Chief Complaint Chief Complaint  Colleen Huang presents with  . Fall  . Hip Pain    HPI Colleen Huang is a 85 y.o. female with history of Alzheimer's dementia presenting via EMS after fall today.  Per triage note Colleen Huang fell backwards while attempting to step onto a curb and caught herself on hands.  Colleen Huang chief complaint is right hip pain.  Denies any and all other complaints.  Colleen Huang received Zofran and fentanyl prior to arrival.  Vital signs stable.  Level 5 caveat due to dementia. HPI  Past Medical History:  Diagnosis Date  . Anxiety    anxiety with episodes of angry outbursts" feels as being treated as a child"  . Dementia (HCC)   . Diabetes mellitus without complication (HCC)    past history in Epic note from Neurlogy visit  . Falls   . Headache    recent neurology visit to evaluate headaches.  . Hearing loss    wears hearing aids  . Memory difficulties    "repeats self alot"- has increasing levels of memory issues  . Pancreatitis 04/2015   [facility] [place] [facility]    Colleen Huang Active Problem List   Diagnosis Date Noted  . Hip fracture (HCC) 06/22/2018  . Alzheimer's dementia with behavioral disturbance (HCC) 04/07/2016  . Acute pancreatitis 08/31/2015  . Chest pain 08/31/2015  . Headache 06/18/2015  . Anxiety state 05/19/2015  . Pain in the chest   . Abdominal pain, epigastric 05/18/2015  . Chest pain at rest 05/18/2015  . Dyslipidemia 05/18/2015  . Benign essential HTN 05/18/2015  . Other specified hypothyroidism 05/18/2015    Past Surgical History:  Procedure Laterality Date  . APPENDECTOMY  1935  . CHOLECYSTECTOMY    . COLONOSCOPY  2005, 2009  . EUS N/A 09/18/2015   Procedure: UPPER ENDOSCOPIC ULTRASOUND (EUS) LINEAR;  Surgeon: Jeani HawkingPatrick Hung, MD;  Location: [facility] ENDOSCOPY;  Service: Endoscopy;   Laterality: N/A;  . FLEXIBLE SIGMOIDOSCOPY  2005  . GALLBLADDER SURGERY  1997  . PARATHYROIDECTOMY / EXPLORATION OF PARATHYROIDS  2003  . RECTAL POLYPECTOMY  2005  . TONSILLECTOMY  1949     OB History    Gravida      Para      Term      Preterm      AB      Living  3     SAB      TAB      Ectopic      Multiple      Live Births               Home Medications    Prior to Admission medications   Medication Sig Start Date End Date Taking? Authorizing Provider  amLODipine (NORVASC) 10 MG tablet Take 10 mg by mouth at bedtime.  04/09/16  Yes [provider]  latanoprost (XALATAN) 0.005 % ophthalmic solution Place 1 drop into both eyes at bedtime.    Yes [provider]  levothyroxine (SYNTHROID, LEVOTHROID) 88 MCG tablet Take 88 mcg by mouth daily.  03/19/17  Yes [provider]  LORazepam (ATIVAN) 0.5 MG tablet Take 0.5 mg by mouth 2 (two) times daily.  03/24/16  Yes [provider]  Multiple Vitamins-Minerals (MULTIVITAMIN PO) Take 1 tablet by mouth daily.    Yes [provider]  sertraline (ZOLOFT) 100 MG tablet Take 100 mg by mouth daily.  03/16/18  Yes [provider]  triamcinolone cream (KENALOG) 0.1 % Apply 1 application topically 2 (two) times daily as needed. 05/15/18  Yes [provider]  sertraline (ZOLOFT) 50 MG tablet Take 1 tablet (50 mg total) by mouth daily. Colleen Huang not taking: Reported on 06/22/2018 10/10/17   Anson Fret, MD    Family History Family History  Problem Relation Age of Onset  . Dementia Brother     Social History Social History   Tobacco Use  . Smoking status: Never Smoker  . Smokeless tobacco: Never Used  Substance Use Topics  . Alcohol use: No    Alcohol/week: 0.0 standard drinks  . Drug use: No     Allergies   Codeine; Hydrocodone; Penicillins; Percocet [oxycodone-acetaminophen]; and Sulfa antibiotics   Review of Systems Review of Systems  Unable  to perform ROS: Dementia  Cardiovascular: Negative for chest pain.  Gastrointestinal: Negative for abdominal pain.  Musculoskeletal: Positive for arthralgias (Right hip pain). Negative for neck pain.  Neurological: Negative for headaches.     Physical Exam Updated Vital Signs BP (!) 133/55   Pulse 71   Temp (!) 97.5 F (36.4 C) (Oral)   Resp 18   SpO2 96%   Physical Exam Constitutional:      General: Colleen Huang is not in acute distress.    Appearance: Normal appearance. Colleen Huang is well-developed, well-groomed and normal weight. Colleen Huang is not ill-appearing or toxic-appearing.  HENT:     Head: Normocephalic and atraumatic. No raccoon eyes, Battle's sign, abrasion or contusion.     Right Ear: Hearing, tympanic membrane, ear canal and external ear normal. No hemotympanum.     Left Ear: Hearing, tympanic membrane, ear canal and external ear normal. No hemotympanum.     Nose: Nose normal.     Mouth/Throat:     Lips: Pink.     Mouth: Mucous membranes are moist.     Pharynx: Oropharynx is clear. Uvula midline.  Eyes:     General: Vision grossly intact. Gaze aligned appropriately.     Extraocular Movements: Extraocular movements intact.     Conjunctiva/sclera: Conjunctivae normal.  Neck:     Musculoskeletal: Full passive range of motion without pain, normal range of motion and neck supple. No spinous process tenderness or muscular tenderness.     Trachea: Trachea and phonation normal. No tracheal tenderness or tracheal deviation.  Cardiovascular:     Rate and Rhythm: Normal rate and regular rhythm.     Pulses:          Dorsalis pedis pulses are 2+ on the right side and 2+ on the left side.       Posterior tibial pulses are 2+ on the right side and 2+ on the left side.     Heart sounds: Normal heart sounds.  Pulmonary:     Effort: Pulmonary effort is normal. No respiratory distress.     Breath sounds: Normal breath sounds and air entry. No decreased breath sounds.  Chest:     Chest wall: No  deformity, tenderness or crepitus.     Comments: No sign of injury to the Colleen Huang's chest Abdominal:     General: Bowel sounds are normal.     Palpations: Abdomen is soft.     Tenderness: There is no abdominal tenderness. There is no guarding or rebound.     Comments: No sign of injury to the Colleen Huang's abdomen  Musculoskeletal:  Comments: Colleen Huang rolled with assistance of to nurses.  No midline C/T/L spinal tenderness to palpation, no paraspinal muscle tenderness, no deformity, crepitus, or step-off noted. No sign of injury to the neck or back.  Hips stable to compression however there is acute pain to the right hip.  There is shortening and rotation of the right leg.   Feet:     Right foot:     Protective Sensation: 3 sites tested. 3 sites sensed.     Left foot:     Protective Sensation: 3 sites tested. 3 sites sensed.  Neurological:     Mental Status: Colleen Huang is alert. Mental status is at baseline.     GCS: GCS eye subscore is 4. GCS verbal subscore is 5. GCS motor subscore is 6.     Comments: Speech is clear, follows commands, Colleen Huang at baseline per caregiver at bedside Major Cranial nerves without deficit, no facial droop Normal strength of upper extremities bilaterally including grip strength.  Plantar and dorsiflexion intact however Colleen Huang with increased pain with movement of the right lower extremity. Sensation normal to light touch Moves extremities without ataxia, coordination intact    ED Treatments / Results  Labs (all labs ordered are listed, but only abnormal results are displayed) Labs Reviewed  CBC WITH DIFFERENTIAL/PLATELET - Abnormal; Notable for the following components:      Result Value   Hemoglobin 11.9 (*)    All other components within normal limits  BASIC METABOLIC PANEL - Abnormal; Notable for the following components:   Glucose, Bld 120 (*)    BUN 25 (*)    GFR calc non Af Amer 56 (*)    All other components within normal limits  TSH  BASIC  METABOLIC PANEL  CBC    EKG None  Radiology Ct Head Wo Contrast  Result Date: 06/22/2018 CLINICAL DATA:  Fell onto asphalt while attempting to step up onto a curb minor head trauma, high clinical risk; cervical spine trauma, high clinical risk; history dementia, diabetes mellitus, hypertension EXAM: CT HEAD WITHOUT CONTRAST CT CERVICAL SPINE WITHOUT CONTRAST TECHNIQUE: Multidetector CT imaging of the head and cervical spine was performed following the standard protocol without intravenous contrast. Multiplanar CT image reconstructions of the cervical spine were also generated. COMPARISON:  02/21/2018 FINDINGS: CT HEAD FINDINGS Brain: Generalized atrophy. Normal ventricular morphology. No midline shift or mass effect. Small vessel chronic ischemic changes of deep cerebral white matter. No intracranial hemorrhage, mass lesion, evidence of acute infarction, or extra-axial fluid collection. Vascular: Atherosclerotic calcifications of internal carotid arteries at skull base Skull: Demineralized but intact Sinuses/Orbits: Partial opacification of LEFT sphenoid sinus, chronic. Remaining visualized paranasal sinuses and mastoid air cells clear Other: N/A CT CERVICAL SPINE FINDINGS Alignment: Normal Skull base and vertebrae: Diffuse osseous demineralization. Skull base intact. Multilevel disc space narrowing and endplate spur formation. Vertebral body heights maintained without fracture or bone destruction. Scattered facet degenerative changes bilaterally. Encroachment upon cervical neural foramina bilaterally by uncovertebral spurs. Soft tissues and spinal canal: Prevertebral soft tissues normal thickness. Atherosclerotic calcifications at carotid bifurcations. Small isthmic thyroid nodule 16 mm diameter unchanged Disc levels:  No additional abnormalities Upper chest: Lung apices clear Other: Atherosclerotic calcifications aortic arch. IMPRESSION: Atrophy with small vessel chronic ischemic changes of deep cerebral  white matter. No acute intracranial abnormalities. Degenerative disc and facet disease changes of the cervical spine. No acute cervical spine abnormalities. Stable isthmic thyroid nodule. Aortic Atherosclerosis (ICD10-I70.0). Electronically Signed   By: Ulyses SouthwardMark  Boles M.D.   On:  06/22/2018 15:51   Ct Cervical Spine Wo Contrast  Result Date: 06/22/2018 CLINICAL DATA:  Larey Seat onto asphalt while attempting to step up onto a curb minor head trauma, high clinical risk; cervical spine trauma, high clinical risk; history dementia, diabetes mellitus, hypertension EXAM: CT HEAD WITHOUT CONTRAST CT CERVICAL SPINE WITHOUT CONTRAST TECHNIQUE: Multidetector CT imaging of the head and cervical spine was performed following the standard protocol without intravenous contrast. Multiplanar CT image reconstructions of the cervical spine were also generated. COMPARISON:  02/21/2018 FINDINGS: CT HEAD FINDINGS Brain: Generalized atrophy. Normal ventricular morphology. No midline shift or mass effect. Small vessel chronic ischemic changes of deep cerebral white matter. No intracranial hemorrhage, mass lesion, evidence of acute infarction, or extra-axial fluid collection. Vascular: Atherosclerotic calcifications of internal carotid arteries at skull base Skull: Demineralized but intact Sinuses/Orbits: Partial opacification of LEFT sphenoid sinus, chronic. Remaining visualized paranasal sinuses and mastoid air cells clear Other: N/A CT CERVICAL SPINE FINDINGS Alignment: Normal Skull base and vertebrae: Diffuse osseous demineralization. Skull base intact. Multilevel disc space narrowing and endplate spur formation. Vertebral body heights maintained without fracture or bone destruction. Scattered facet degenerative changes bilaterally. Encroachment upon cervical neural foramina bilaterally by uncovertebral spurs. Soft tissues and spinal canal: Prevertebral soft tissues normal thickness. Atherosclerotic calcifications at carotid bifurcations.  Small isthmic thyroid nodule 16 mm diameter unchanged Disc levels:  No additional abnormalities Upper chest: Lung apices clear Other: Atherosclerotic calcifications aortic arch. IMPRESSION: Atrophy with small vessel chronic ischemic changes of deep cerebral white matter. No acute intracranial abnormalities. Degenerative disc and facet disease changes of the cervical spine. No acute cervical spine abnormalities. Stable isthmic thyroid nodule. Aortic Atherosclerosis (ICD10-I70.0). Electronically Signed   By: Ulyses Southward M.D.   On: 06/22/2018 15:51   Dg Hip Unilat  With Pelvis 2-3 Views Right  Result Date: 06/22/2018 CLINICAL DATA:  Acute RIGHT hip pain following fall today. Initial encounter. EXAM: DG HIP (WITH OR WITHOUT PELVIS) 2-3V RIGHT COMPARISON:  None. FINDINGS: A comminuted intertrochanteric RIGHT femur fracture is noted with varus angulation. No dislocation. No other fractures are noted. IMPRESSION: Comminuted intertrochanteric RIGHT femur fracture with varus angulation. Electronically Signed   By: Harmon Pier M.D.   On: 06/22/2018 17:04   Dg Femur Min 2 Views Right  Result Date: 06/22/2018 CLINICAL DATA:  Acute RIGHT leg pain following fall. Initial encounter. EXAM: RIGHT FEMUR 2 VIEWS COMPARISON:  None. FINDINGS: A comminuted intertrochanteric femur fracture is noted with varus angulation. No other fracture identified. No dislocation. Degenerative changes in the knee are present. IMPRESSION: Comminuted intertrochanteric RIGHT femur fracture with varus angulation. Electronically Signed   By: Harmon Pier M.D.   On: 06/22/2018 17:05    Procedures Procedures (including critical care time)  Medications Ordered in ED Medications  clindamycin (CLEOCIN) IVPB 900 mg (has no administration in time range)  morphine 2 MG/ML injection 2 mg (2 mg Intravenous Given 06/22/18 2002)  methocarbamol (ROBAXIN) tablet 500 mg (500 mg Oral Given 06/22/18 2122)  senna-docusate (Senokot-S) tablet 2 tablet (2 tablets  Oral Given 06/22/18 2123)  bisacodyl (DULCOLAX) suppository 10 mg (10 mg Rectal Not Given 06/22/18 2126)  ondansetron (ZOFRAN) injection 4 mg (has no administration in time range)  latanoprost (XALATAN) 0.005 % ophthalmic solution 1 drop (has no administration in time range)  LORazepam (ATIVAN) tablet 0.5 mg (0.5 mg Oral Given 06/22/18 2123)  amLODipine (NORVASC) tablet 10 mg (has no administration in time range)  levothyroxine (SYNTHROID, LEVOTHROID) tablet 88 mcg (has no administration in time range)  multivitamin liquid (has no administration in time range)  sertraline (ZOLOFT) tablet 100 mg (has no administration in time range)  sodium chloride flush (NS) 0.9 % injection 3 mL (3 mLs Intravenous Not Given 06/22/18 2126)  sodium chloride flush (NS) 0.9 % injection 3 mL (has no administration in time range)  0.9 %  sodium chloride infusion (has no administration in time range)  acetaminophen (TYLENOL) tablet 650 mg (650 mg Oral Given 06/22/18 2123)    Or  acetaminophen (TYLENOL) suppository 650 mg ( Rectal See Alternative 06/22/18 2123)  traZODone (DESYREL) tablet 50 mg (has no administration in time range)  polyethylene glycol (MIRALAX / GLYCOLAX) packet 17 g (has no administration in time range)  albuterol (PROVENTIL) (2.5 MG/3ML) 0.083% nebulizer solution 2.5 mg (has no administration in time range)  heparin injection 5,000 Units (has no administration in time range)  dextrose 5 %-0.45 % sodium chloride infusion ( Intravenous New Bag/Given 06/22/18 2113)  LORazepam (ATIVAN) injection 0.5 mg (has no administration in time range)  hydrALAZINE (APRESOLINE) injection 10 mg (has no administration in time range)  fentaNYL (SUBLIMAZE) injection 50 mcg (50 mcg Intravenous Given 06/22/18 1516)  sodium chloride 0.9 % bolus 500 mL (0 mLs Intravenous Stopped 06/22/18 1921)  ondansetron (ZOFRAN) injection 2 mg (2 mg Intravenous Given 06/22/18 1648)  fentaNYL (SUBLIMAZE) injection 50 mcg (50 mcg Intravenous  Given 06/22/18 1650)  fentaNYL (SUBLIMAZE) injection 50 mcg (50 mcg Intravenous Given 06/22/18 1738)     Initial Impression / Assessment and Plan / ED Course  I have reviewed the triage vital signs and the nursing notes.  Pertinent labs & imaging results that were available during my care of the Colleen Huang were reviewed by me and considered in my medical decision making (see chart for details).    63:34 PM: 85 year old female with history of dementia presenting after fall.  Level 5 caveat, no EMS at bedside, only history from triage note.  Colleen Huang complaining of right hip/upper thigh pain, shortening and rotation noted.  Pain controlled, additional imaging ordered.  Blood work ordered.  Colleen Huang is alert moving all extremities spontaneously, no neuro deficits.  Colleen Huang is agitated and minimally compliant with exam. ------------------ 3:20 PM: Colleen Huang's son and caregiver are at bedside.  Caregiver reports that Colleen Huang was walking with the Colleen Huang when Colleen Huang fell backwards off a step after missing her footing, fell back to her right side twisting and catching herself on her arms before falling to the floor.  No loss of consciousness or change from baseline mental status per caregiver. ------------------ CBC nonacute BMP nonacute Vital signs of remained stable in emergency department CT head/cervical spine without acute findings Colleen Huang without sign of injury to the chest, abdomen or back. ---------------------------------- Colleen Huang returned from x-ray, Colleen Huang with fracture of the right hip.  Colleen Huang and family members informed of results.  Additional pain and nausea medication given.  Discussed plan to admit for Ortho consult, family agreeable to plan of care. ---------- 4:50 PM: Imaging reviewed with Dr. Rush Landmark.  Colleen Huang with commuted fracture of the right hip.  Consult called to Ortho, Dr. Susa Simmonds who advises admitting Colleen Huang to medicine team for him to consult.  Consult called to  hospitalist. ---------- Colleen Huang has been admitted by Dr. Mariea Clonts to hospitalist service.   Colleen Huang seen and evaluated by Dr. Rush Landmark during this visit and agrees with admission at this time.  Note: Portions of this report may have been transcribed using voice recognition software. Every effort was made to ensure accuracy; however, inadvertent  computerized transcription errors may still be present. Final Clinical Impressions(s) / ED Diagnoses   Final diagnoses:  Closed nondisplaced comminuted fracture of shaft of right femur, initial encounter [facility])    ED Discharge Orders    None       Elizabeth Palau 06/22/18 2259    Tegeler, Canary Brim, MD 06/23/18 [phone]

## 2018-06-22 NOTE — ED Notes (Signed)
RN made aware patient received 2mg  Morphine prior to transport.

## 2018-06-23 ENCOUNTER — Inpatient Hospital Stay (HOSPITAL_COMMUNITY): Payer: Medicare Other | Admitting: Anesthesiology

## 2018-06-23 ENCOUNTER — Encounter (HOSPITAL_COMMUNITY)
Admission: EM | Disposition: A | Discharge status: Skilled Nursing Facility | Payer: Self-pay | Source: Home / Self Care | Attending: Family Medicine

## 2018-06-23 ENCOUNTER — Other Ambulatory Visit: Payer: Self-pay

## 2018-06-23 ENCOUNTER — Inpatient Hospital Stay (HOSPITAL_COMMUNITY): Payer: Medicare Other

## 2018-06-23 HISTORY — PX: FEMUR IM NAIL: SHX1597

## 2018-06-23 LAB — BASIC METABOLIC PANEL
Anion gap: 8 (ref 5–15)
BUN: 21 mg/dL (ref 8–23)
CO2: 26 mmol/L (ref 22–32)
Calcium: 8.6 mg/dL — ABNORMAL LOW (ref 8.9–10.3)
Chloride: 102 mmol/L (ref 98–111)
Creatinine, Ser: 0.83 mg/dL (ref 0.44–1.00)
GFR calc Af Amer: >60 mL/min (ref >60)
GFR calc non Af Amer: >60 mL/min (ref >60)
Glucose, Bld: 144 mg/dL — ABNORMAL HIGH (ref 70–99)
POTASSIUM: 4.3 mmol/L (ref 3.5–5.1)
Sodium: 136 mmol/L (ref 135–145)

## 2018-06-23 LAB — CBC
HCT: 32.9 % — ABNORMAL LOW (ref 36.0–46.0)
Hemoglobin: 10.4 g/dL — ABNORMAL LOW (ref 12.0–15.0)
MCH: 28.6 pg (ref 26.0–34.0)
MCHC: 31.6 g/dL (ref 30.0–36.0)
MCV: 90.4 fL (ref 80.0–100.0)
NRBC: 0 % (ref 0.0–0.2)
Platelets: 231 10*3/uL (ref 150–400)
RBC: 3.64 MIL/uL — ABNORMAL LOW (ref 3.87–5.11)
RDW: 13.9 % (ref 11.5–15.5)
WBC: 9 10*3/uL (ref 4.0–10.5)

## 2018-06-23 LAB — GLUCOSE, CAPILLARY
Glucose-Capillary: 129 mg/dL — ABNORMAL HIGH (ref 70–99)
Glucose-Capillary: 96 mg/dL (ref 70–99)

## 2018-06-23 LAB — SURGICAL PCR SCREEN
MRSA, PCR: NEGATIVE
STAPHYLOCOCCUS AUREUS: NEGATIVE

## 2018-06-23 SURGERY — INSERTION, INTRAMEDULLARY ROD, FEMUR
Anesthesia: Spinal | Site: Hip | Laterality: Right

## 2018-06-23 SURGERY — FIXATION, FRACTURE, INTERTROCHANTERIC, WITH INTRAMEDULLARY ROD
Anesthesia: Choice | Laterality: Right

## 2018-06-23 MED ORDER — ONDANSETRON HCL 4 MG/2ML IJ SOLN: 4.0000 mg | Freq: Once | INTRAMUSCULAR | Status: DC | PRN

## 2018-06-23 MED ORDER — ONDANSETRON HCL 4 MG/2ML IJ SOLN
INTRAMUSCULAR | Status: DC | PRN
  Administered 2018-06-23: 4 mg via INTRAVENOUS

## 2018-06-23 MED ORDER — PROPOFOL 10 MG/ML IV BOLUS
INTRAVENOUS | Status: AC
  Filled 2018-06-23: qty 20

## 2018-06-23 MED ORDER — FENTANYL CITRATE (PF) 100 MCG/2ML IJ SOLN
INTRAMUSCULAR | Status: AC
  Filled 2018-06-23: qty 2

## 2018-06-23 MED ORDER — PROPOFOL 500 MG/50ML IV EMUL
INTRAVENOUS | Status: DC | PRN
  Administered 2018-06-23: 10 mg via INTRAVENOUS
  Administered 2018-06-23: 20 mg via INTRAVENOUS
  Administered 2018-06-23 (×2): 10 mg via INTRAVENOUS

## 2018-06-23 MED ORDER — ONDANSETRON HCL 4 MG/2ML IJ SOLN
INTRAMUSCULAR | Status: AC
  Filled 2018-06-23: qty 2

## 2018-06-23 MED ORDER — SODIUM CHLORIDE 0.9 % IV SOLN
INTRAVENOUS | Status: DC | PRN
  Administered 2018-06-23: 07:00:00 via INTRAVENOUS

## 2018-06-23 MED ORDER — PROPOFOL 500 MG/50ML IV EMUL
INTRAVENOUS | Status: DC | PRN
  Administered 2018-06-23: 25 ug/kg/min via INTRAVENOUS

## 2018-06-23 MED ORDER — BUPIVACAINE IN DEXTROSE 0.75-8.25 % IT SOLN
INTRATHECAL | Status: DC | PRN
  Administered 2018-06-23: 1.6 mL via INTRATHECAL

## 2018-06-23 MED ORDER — CLINDAMYCIN PHOSPHATE 900 MG/50ML IV SOLN
INTRAVENOUS | Status: AC
  Filled 2018-06-23: qty 50

## 2018-06-23 MED ORDER — SODIUM CHLORIDE 0.9 % IV SOLN
INTRAVENOUS | Status: DC | PRN
  Administered 2018-06-23: 25 ug/min via INTRAVENOUS

## 2018-06-23 MED ORDER — 0.9 % SODIUM CHLORIDE (POUR BTL) OPTIME
TOPICAL | Status: DC | PRN
  Administered 2018-06-23: 1000 mL

## 2018-06-23 MED ORDER — FENTANYL CITRATE (PF) 100 MCG/2ML IJ SOLN
25.0000 ug | INTRAMUSCULAR | Status: DC | PRN
  Administered 2018-06-23 (×2): 25 ug via INTRAVENOUS

## 2018-06-23 SURGICAL SUPPLY — 39 items
BAG SPEC THK2 15X12 ZIP CLS (MISCELLANEOUS)
BAG ZIPLOCK 12X15 (MISCELLANEOUS) IMPLANT
BIT DRILL CANN LG 4.3MM (BIT) IMPLANT
BNDG GAUZE ELAST 4 BULKY (GAUZE/BANDAGES/DRESSINGS) IMPLANT
BOOTIES KNEE HIGH SLOAN (MISCELLANEOUS) ×2 IMPLANT
CHLORAPREP W/TINT 26ML (MISCELLANEOUS) ×1 IMPLANT
COVER BEARD FULL WHITE LF (PROTECTIVE WEAR) ×2 IMPLANT
COVER PERINEAL POST (MISCELLANEOUS) ×2 IMPLANT
COVER SURGICAL LIGHT HANDLE (MISCELLANEOUS) ×2 IMPLANT
COVER WAND RF STERILE (DRAPES) IMPLANT
DRAPE C-ARM 42X120 X-RAY (DRAPES) ×2 IMPLANT
DRAPE INCISE IOBAN 66X45 STRL (DRAPES) ×2 IMPLANT
DRAPE SHEET LG 3/4 BI-LAMINATE (DRAPES) ×2 IMPLANT
DRILL BIT CANN LG 4.3MM (BIT) ×2
DRSG MEPILEX BORDER 4X4 (GAUZE/BANDAGES/DRESSINGS) ×1 IMPLANT
DRSG MEPILEX BORDER 4X8 (GAUZE/BANDAGES/DRESSINGS) ×1 IMPLANT
DRSG TEGADERM 4X4.75 (GAUZE/BANDAGES/DRESSINGS) ×8 IMPLANT
DURAPREP 26ML APPLICATOR (WOUND CARE) ×2 IMPLANT
ELECT REM PT RETURN 15FT ADLT (MISCELLANEOUS) ×2 IMPLANT
GAUZE SPONGE 4X4 12PLY STRL (GAUZE/BANDAGES/DRESSINGS) ×2 IMPLANT
GLOVE BIOGEL M STRL SZ7.5 (GLOVE) ×2 IMPLANT
GLOVE BIOGEL PI IND STRL 8 (GLOVE) IMPLANT
GLOVE BIOGEL PI INDICATOR 8 (GLOVE)
GOWN STRL REUS W/TWL XL LVL3 (GOWN DISPOSABLE) IMPLANT
GUIDEPIN 3.2X17.5 THRD DISP (PIN) ×1 IMPLANT
GUIDEWIRE BALL NOSE 80CM (WIRE) ×1 IMPLANT
KIT BASIN OR (CUSTOM PROCEDURE TRAY) ×2 IMPLANT
MANIFOLD NEPTUNE II (INSTRUMENTS) ×2 IMPLANT
NAIL HIP FRACT 130D 11X180 (Screw) ×1 IMPLANT
PACK GENERAL/GYN (CUSTOM PROCEDURE TRAY) ×2 IMPLANT
PROTECTOR NERVE ULNAR (MISCELLANEOUS) ×3 IMPLANT
SCREW BONE CORTICAL 5.0X36 (Screw) ×1 IMPLANT
SCREW LAG HIP NAIL 10.5X95 (Screw) ×1 IMPLANT
SOL PREP PROV IODINE SCRUB 4OZ (MISCELLANEOUS) ×1 IMPLANT
STAPLER VISISTAT 35W (STAPLE) ×2 IMPLANT
SUT MNCRL AB 4-0 PS2 18 (SUTURE) IMPLANT
SUT NYLON 3 0 (SUTURE) IMPLANT
SUT PDS AB 2-0 CT 36 (SUTURE) IMPLANT
TOWEL OR 17X26 10 PK STRL BLUE (TOWEL DISPOSABLE) ×2 IMPLANT

## 2018-06-23 NOTE — Plan of Care (Signed)
Pt stable though post op pain remains. Family at bedside. No s/s of distress.

## 2018-06-23 NOTE — Progress Notes (Signed)
Pt off floor in pacu. Report received from [facility] on pt.

## 2018-06-23 NOTE — Anesthesia Procedure Notes (Signed)
Date/Time: 06/23/2018 7:31 AM Performed by: Thornell Mule, CRNA Oxygen Delivery Method: Simple face mask

## 2018-06-23 NOTE — Consult Note (Signed)
Reason for Consult: Right intertrochanteric hip fracture Referring Physician: [facility]  Colleen Huang is an 85 y.o. female.  HPI: 85 year old female with history of dementia who fell going up the stairs on her right hip.  She had immediate pain in her hip.  She was brought to the emergency department diagnosed with an intertrochanteric hip fracture.  She was admitted to the medicine service and orthopedics was consulted for evaluation.  On evaluation patient complains of pain in the right hip.  She denies pain in her upper extremities or left lower extremity.  She does have a history of dementia therefore questioning and history is difficult to obtain.  She is accompanied by her son at bedside.  He reports a baseline ambulation occasionally with a walker at home and with assistance when outside of the home.  She has a caretaker.  Past Medical History:  Diagnosis Date  . Anxiety    anxiety with episodes of angry outbursts" feels as being treated as a child"  . Dementia (HCC)   . Diabetes mellitus without complication (HCC)    past history in Epic note from Neurlogy visit  . Falls   . Headache    recent neurology visit to evaluate headaches.  . Hearing loss    wears hearing aids  . Memory difficulties    "repeats self alot"- has increasing levels of memory issues  . Pancreatitis 04/2015   [facility]    Past Surgical History:  Procedure Laterality Date  . APPENDECTOMY  1935  . CHOLECYSTECTOMY    . COLONOSCOPY  2005, 2009  . EUS N/A 09/18/2015   Procedure: UPPER ENDOSCOPIC ULTRASOUND (EUS) LINEAR;  Surgeon: Jeani Hawking, MD;  Location: [facility];  Service: Endoscopy;  Laterality: N/A;  . FLEXIBLE SIGMOIDOSCOPY  2005  . GALLBLADDER SURGERY  1997  . PARATHYROIDECTOMY / EXPLORATION OF PARATHYROIDS  2003  . RECTAL POLYPECTOMY  2005  . TONSILLECTOMY  1949    Family History  Problem Relation Age of Onset  .  Dementia Brother     Social History:  reports that she has never smoked. She has never used smokeless tobacco. She reports that she does not drink alcohol or use drugs.  Allergies:  Allergies  Allergen Reactions  . Codeine Other (See Comments)    Really bad reaction - that's all she can remember   . Hydrocodone Other (See Comments)    Really bad reaction - that's all she can remember   . Penicillins Itching, Swelling and Rash    Has patient had a PCN reaction causing immediate rash, facial/tongue/throat swelling, SOB or lightheadedness with hypotension: Yes  Has patient had a PCN reaction causing severe rash involving mucus membranes or skin necrosis: Yes  Has patient had a PCN reaction that required hospitalization: No  Has patient had a PCN reaction occurring within the last 10 years: No  If all of the above answers are "NO", then may proceed with Cephalosporin use.   [name] Percocet [Oxycodone-Acetaminophen] Other (See Comments)    Unknown  . Sulfa Antibiotics Other (See Comments)    Unknown reaction     Medications: I have reviewed the patient's current medications.  Results for orders placed or performed during the hospital encounter of 06/22/18 (from the past 48 hour(s))  CBC with Differential     Status: Abnormal   Collection Time: 06/22/18  3:07 PM  Result Value Ref Range   WBC 7.9 4.0 - 10.5 K/uL  RBC 4.20 3.87 - 5.11 MIL/uL   Hemoglobin 11.9 (L) 12.0 - 15.0 g/dL   HCT 82.9 56.2 - 13.0 %   MCV 91.4 80.0 - 100.0 fL   MCH 28.3 26.0 - 34.0 pg   MCHC 31.0 30.0 - 36.0 g/dL   RDW 86.5 78.4 - 69.6 %   Platelets 275 150 - 400 K/uL   nRBC 0.0 0.0 - 0.2 %   Neutrophils Relative % 74 %   Neutro Abs 5.9 1.7 - 7.7 K/uL   Lymphocytes Relative 16 %   Lymphs Abs 1.2 0.7 - 4.0 K/uL   Monocytes Relative 8 %   Monocytes Absolute 0.6 0.1 - 1.0 K/uL   Eosinophils Relative 1 %   Eosinophils Absolute 0.1 0.0 - 0.5 K/uL   Basophils Relative 0 %   Basophils Absolute 0.0 0.0 - 0.1 K/uL    Immature Granulocytes 1 %   Abs Immature Granulocytes 0.07 0.00 - 0.07 K/uL    Comment: Performed at [facility], [address] [place], [place] [address]  Basic metabolic panel     Status: Abnormal   Collection Time: 06/22/18  3:07 PM  Result Value Ref Range   Sodium 139 135 - 145 mmol/L   Potassium 4.1 3.5 - 5.1 mmol/L   Chloride 103 98 - 111 mmol/L   CO2 27 22 - 32 mmol/L   Glucose, Bld 120 (H) 70 - 99 mg/dL   BUN 25 (H) 8 - 23 mg/dL   Creatinine, Ser 4.13 0.44 - 1.00 mg/dL   Calcium 8.9 8.9 - 24.4 mg/dL   GFR calc non Af Amer 56 (L) >60 mL/min   GFR calc Af Amer >60 >60 mL/min   Anion gap 9 5 - 15    Comment: Performed at [facility], [address], [place], [place] [address]  TSH     Status: None   Collection Time: 06/22/18  9:23 PM  Result Value Ref Range   TSH 3.949 0.350 - 4.500 uIU/mL    Comment: Performed by a 3rd Generation assay with a functional sensitivity of <=0.01 uIU/mL. Performed at [facility], [address] [place], [place] [address]   Surgical pcr screen     Status: None   Collection Time: 06/23/18  2:47 AM  Result Value Ref Range   MRSA, PCR NEGATIVE NEGATIVE   Staphylococcus aureus NEGATIVE NEGATIVE    Comment: (NOTE) The Xpert SA Assay (FDA approved for NASAL specimens in patients 57 years of age and older), is one component of a comprehensive surveillance program. It is not intended to diagnose infection nor to guide or monitor treatment. Performed at [facility], [address] [place], [place] [address]   Basic metabolic panel     Status: Abnormal   Collection Time: 06/23/18  3:41 AM  Result Value Ref Range   Sodium 136 135 - 145 mmol/L   Potassium 4.3 3.5 - 5.1 mmol/L   Chloride 102 98 - 111 mmol/L   CO2 26 22 - 32 mmol/L   Glucose, Bld 144 (H) 70 - 99 mg/dL   BUN 21 8 - 23 mg/dL   Creatinine, Ser 7.42 0.44 - 1.00 mg/dL   Calcium 8.6 (L) 8.9 - 10.3 mg/dL    GFR calc non Af Amer >60 >60 mL/min   GFR calc Af Amer >60 >60 mL/min   Anion gap 8 5 - 15    Comment: Performed at [facility], [address] [place], [place] [address]  CBC     Status: Abnormal   Collection Time: 06/23/18  3:41 AM  Result Value Ref Range   WBC 9.0 4.0 - 10.5 K/uL   RBC 3.64 (L) 3.87 - 5.11 MIL/uL   Hemoglobin 10.4 (L) 12.0 - 15.0 g/dL   HCT 60.432.9 (L) 54.036.0 - 98.146.0 %   MCV 90.4 80.0 - 100.0 fL   MCH 28.6 26.0 - 34.0 pg   MCHC 31.6 30.0 - 36.0 g/dL   RDW 19.113.9 47.811.5 - 29.515.5 %   Platelets 231 150 - 400 K/uL   nRBC 0.0 0.0 - 0.2 %    Comment: Performed at [facility], [address] [place], [place] [address]  Glucose, capillary     Status: Abnormal   Collection Time: 06/23/18  6:27 AM  Result Value Ref Range   Glucose-Capillary 129 (H) 70 - 99 mg/dL    Ct Head Wo Contrast  Result Date: 06/22/2018 CLINICAL DATA:  Fell onto asphalt while attempting to step up onto a curb minor head trauma, high clinical risk; cervical spine trauma, high clinical risk; history dementia, diabetes mellitus, hypertension EXAM: CT HEAD WITHOUT CONTRAST CT CERVICAL SPINE WITHOUT CONTRAST TECHNIQUE: Multidetector CT imaging of the head and cervical spine was performed following the standard protocol without intravenous contrast. Multiplanar CT image reconstructions of the cervical spine were also generated. COMPARISON:  02/21/2018 FINDINGS: CT HEAD FINDINGS Brain: Generalized atrophy. Normal ventricular morphology. No midline shift or mass effect. Small vessel chronic ischemic changes of deep cerebral white matter. No intracranial hemorrhage, mass lesion, evidence of acute infarction, or extra-axial fluid collection. Vascular: Atherosclerotic calcifications of internal carotid arteries at skull base Skull: Demineralized but intact Sinuses/Orbits: Partial opacification of LEFT sphenoid sinus, chronic. Remaining visualized paranasal sinuses and mastoid air cells  clear Other: N/A CT CERVICAL SPINE FINDINGS Alignment: Normal Skull base and vertebrae: Diffuse osseous demineralization. Skull base intact. Multilevel disc space narrowing and endplate spur formation. Vertebral body heights maintained without fracture or bone destruction. Scattered facet degenerative changes bilaterally. Encroachment upon cervical neural foramina bilaterally by uncovertebral spurs. Soft tissues and spinal canal: Prevertebral soft tissues normal thickness. Atherosclerotic calcifications at carotid bifurcations. Small isthmic thyroid nodule 16 mm diameter unchanged Disc levels:  No additional abnormalities Upper chest: Lung apices clear Other: Atherosclerotic calcifications aortic arch. IMPRESSION: Atrophy with small vessel chronic ischemic changes of deep cerebral white matter. No acute intracranial abnormalities. Degenerative disc and facet disease changes of the cervical spine. No acute cervical spine abnormalities. Stable isthmic thyroid nodule. Aortic Atherosclerosis (ICD10-I70.0). Electronically Signed   By: Ulyses SouthwardMark  Boles M.D.   On: 06/22/2018 15:51   Ct Cervical Spine Wo Contrast  Result Date: 06/22/2018 CLINICAL DATA:  Larey SeatFell onto asphalt while attempting to step up onto a curb minor head trauma, high clinical risk; cervical spine trauma, high clinical risk; history dementia, diabetes mellitus, hypertension EXAM: CT HEAD WITHOUT CONTRAST CT CERVICAL SPINE WITHOUT CONTRAST TECHNIQUE: Multidetector CT imaging of the head and cervical spine was performed following the standard protocol without intravenous contrast. Multiplanar CT image reconstructions of the cervical spine were also generated. COMPARISON:  02/21/2018 FINDINGS: CT HEAD FINDINGS Brain: Generalized atrophy. Normal ventricular morphology. No midline shift or mass effect. Small vessel chronic ischemic changes of deep cerebral white matter. No intracranial hemorrhage, mass lesion, evidence of acute infarction, or extra-axial fluid  collection. Vascular: Atherosclerotic calcifications of internal carotid arteries at skull base Skull: Demineralized but intact Sinuses/Orbits: Partial opacification of LEFT sphenoid sinus,  chronic. Remaining visualized paranasal sinuses and mastoid air cells clear Other: N/A CT CERVICAL SPINE FINDINGS Alignment: Normal Skull base and vertebrae: Diffuse osseous demineralization. Skull base intact. Multilevel disc space narrowing and endplate spur formation. Vertebral body heights maintained without fracture or bone destruction. Scattered facet degenerative changes bilaterally. Encroachment upon cervical neural foramina bilaterally by uncovertebral spurs. Soft tissues and spinal canal: Prevertebral soft tissues normal thickness. Atherosclerotic calcifications at carotid bifurcations. Small isthmic thyroid nodule 16 mm diameter unchanged Disc levels:  No additional abnormalities Upper chest: Lung apices clear Other: Atherosclerotic calcifications aortic arch. IMPRESSION: Atrophy with small vessel chronic ischemic changes of deep cerebral white matter. No acute intracranial abnormalities. Degenerative disc and facet disease changes of the cervical spine. No acute cervical spine abnormalities. Stable isthmic thyroid nodule. Aortic Atherosclerosis (ICD10-I70.0). Electronically Signed   By: Ulyses Southward M.D.   On: 06/22/2018 15:51   Dg Hip Unilat  With Pelvis 2-3 Views Right  Result Date: 06/22/2018 CLINICAL DATA:  Acute RIGHT hip pain following fall today. Initial encounter. EXAM: DG HIP (WITH OR WITHOUT PELVIS) 2-3V RIGHT COMPARISON:  None. FINDINGS: A comminuted intertrochanteric RIGHT femur fracture is noted with varus angulation. No dislocation. No other fractures are noted. IMPRESSION: Comminuted intertrochanteric RIGHT femur fracture with varus angulation. Electronically Signed   By: Harmon Pier M.D.   On: 06/22/2018 17:04   Dg Femur Min 2 Views Right  Result Date: 06/22/2018 CLINICAL DATA:  Acute RIGHT leg  pain following fall. Initial encounter. EXAM: RIGHT FEMUR 2 VIEWS COMPARISON:  None. FINDINGS: A comminuted intertrochanteric femur fracture is noted with varus angulation. No other fracture identified. No dislocation. Degenerative changes in the knee are present. IMPRESSION: Comminuted intertrochanteric RIGHT femur fracture with varus angulation. Electronically Signed   By: Harmon Pier M.D.   On: 06/22/2018 17:05    Review of Systems  Constitutional: Negative.   HENT: Negative.   Eyes: Negative.   Cardiovascular: Negative.   Gastrointestinal: Negative.   Musculoskeletal:       Right hip pains  Skin: Negative.   Neurological: Negative.   Psychiatric/Behavioral: Positive for memory loss.   Blood pressure (!) 133/55, pulse 71, temperature (!) 97.5 F (36.4 C), temperature source Oral, resp. rate 18, SpO2 96 %. Physical Exam  Constitutional: She appears well-developed.  HENT:  Head: Normocephalic.  Eyes: Conjunctivae are normal.  Neck: Neck supple.  Cardiovascular: Normal rate.  Respiratory: Breath sounds normal.  GI: Soft.  Musculoskeletal:     Comments: Evaluation of right lower extremity demonstrates tenderness palpation about the hip region.  No tenderness about the knee, leg or ankle or foot.  Did not attempt hip logroll due to for underlying fracture.  No evidence of left lower extremity injury.  She is able to move bilateral upper extremities without sign of injury.  She is able to wiggle toes on the right lower extremity.  Endorses sensation light touch about the foot.  Foot is warm and well perfused.    Assessment/Plan: I had a lengthy discussion with the family.  We will plan for cephalo-medullary nailing of her right intertrochanteric hip fracture.  This will be to stabilize the fracture and allow for early weightbearing.  We discussed the risk, benefits and alternatives to the surgery which included wound healing complications, infection, nonunion, malunion, need for further  surgery, damage surrounding structures.  We also discussed the perioperative risks including death.  At this point she will remain n.p.o. until surgery.  She will remain on bedrest until surgery.  After weighing the risk, benefits alternatives the patient and family consented for surgery.  Terance Hart 06/23/2018, 7:11 AM

## 2018-06-23 NOTE — Anesthesia Preprocedure Evaluation (Addendum)
Anesthesia Evaluation  Patient identified by MRN, date of birth, ID band Patient awake    Reviewed: Allergy & Precautions, NPO status , Patient's Chart, lab work & pertinent test results  Airway Mallampati: III  TM Distance: >3 FB Neck ROM: Full    Dental no notable dental hx.    Pulmonary neg pulmonary ROS,    Pulmonary exam normal breath sounds clear to auscultation       Cardiovascular hypertension, Pt. on medications Normal cardiovascular exam Rhythm:Regular Rate:Normal  ECG: SR, rate 60   Neuro/Psych  Headaches, PSYCHIATRIC DISORDERS Anxiety Dementia Memory difficulties    GI/Hepatic negative GI ROS, Neg liver ROS,   Endo/Other  diabetesHypothyroidism   Renal/GU negative Renal ROS     Musculoskeletal negative musculoskeletal ROS (+)   Abdominal   Peds  Hematology  (+) anemia ,   Anesthesia Other Findings RIGHT INTERTROCHANTERIC FRACTURE  Reproductive/Obstetrics                             Anesthesia Physical Anesthesia Plan  ASA: III  Anesthesia Plan: Spinal   Post-op Pain Management:    Induction:   PONV Risk Score and Plan: 2 and Ondansetron and Treatment may vary due to age or medical condition  Airway Management Planned: Natural Airway  Additional Equipment:   Intra-op Plan:   Post-operative Plan:   Informed Consent: I have reviewed the patients History and Physical, chart, labs and discussed the procedure including the risks, benefits and alternatives for the proposed anesthesia with the patient or authorized representative who has indicated his/her understanding and acceptance.   Dental advisory given  Plan Discussed with: CRNA  Anesthesia Plan Comments: (Intraoperative suspension of DNR discussed with patient and family)        Anesthesia Quick Evaluation

## 2018-06-23 NOTE — Progress Notes (Signed)
Pt back from surgery with stable vs. Pt did c/o pain and nausea on return from pacu. Pt continues to be weak with memory issues. Family at bedside.

## 2018-06-23 NOTE — Op Note (Signed)
Colleen Huang female 85 y.o. 06/23/2018  PreOperative Diagnosis: Right intertrochanteric femur fracture, comminuted.  PostOperative Diagnosis: Same  PROCEDURE: Cephalo-medullary nailing of right intertrochanteric femur fracture  ANESTHESIA: Spinal  FINDINGS: Right displaced comminuted intertrochanteric femur fracture  IMPLANTS: [employer] affixes 11 x 180  INDICATIONS:85 y.o. female fell while going up some stairs onto her right hip.  She had immediate pain and swelling.  She was taken emergency department diagnosed with a right intertrochanteric femur fracture.  She was admitted to the medicine team for medical evaluation and orthopedics was consulted.  On evaluation given her baseline ambulatory status she was indicated for fixation of her femur fracture.  I discussed with the patient and her family the risk, benefits and alternatives to the surgery which included but were not limited to wound healing complication, infection, nonunion, malunion, need for further surgery, damage surrounding structures.  We also briefly discussed the perioperative risks including death.  After weighing these risks he opted to proceed with surgery.  PROCEDURE: Patient was identified the preoperative holding area.  The right hip was marked by myself.  The consent was signed by myself and the patient's family members given her demented status.  She was taken to the operative suite and spinal anesthesia was performed by anesthesia.  She was moved in the supine position onto a Hana table.  The left leg was padded and strapped to the left crossbar.  The right lower extremity was placed in a traction boot.  Preoperative antibiotics were given.  Gentle traction was performed through the right lower extremity after a timeout was performed.  Then using fluoroscopy appropriate fracture reduction was confirmed.  The right lower extremity was prepped and draped in usual sterile fashion.  I began by placing the  guidewire at the tip of the greater trochanter confirming appropriate placement AP and lateral planes.  This was then inserted into the proximal femur down to the level of the lesser trochanter.  Then a skin incision was made at the skin site of the insertion of the guidepin and the soft tissue guide and entry reamer was introduced.  The proximal part of the femur was opened with the entry reamer.  Then a 11 x 180 mm nail was placed without difficulty.  This was taken to the appropriate position.  Then the cephalo-medullary screw was placed without difficulty.  Fluoroscopy was used to confirm appropriate placement center center within the head of the femur in the AP and lateral planes.  Then the guide was used to place a distal interlocking screw without difficulty.  The wounds were then copiously irrigated with normal saline.  Deep tissue was closed with 2-0 PDS the subcuticular tissue was closed with 4-0 Monocryl and the skin with staples.  Final films were obtained.  The patient was then awakened from anesthesia moved to the hospital bed and taken to recovery room in stable condition.  She tolerated the procedure well.  There were no complications.  Counts were correct at the end of the case.  POST OPERATIVE INSTRUCTIONS: Weight-bear as tolerated on the right lower extremity Mobilize with assistance Reinforce dressing as needed She will follow-up with me in 2 weeks for x-rays, wound check and staple removal. She will be discharged on 325 mg aspirin for 2 weeks postoperatively for DVT prophylaxis.  TOURNIQUET TIME:none  BLOOD LOSS:          DRAINS: none         SPECIMEN: none  COMPLICATIONS:  * No complications entered in OR log *         Disposition: PACU - hemodynamically stable.         Condition: stable

## 2018-06-23 NOTE — Progress Notes (Signed)
Patient Demographics:    Chaquita Basques, is a 85 y.o. female, DOB - 1924-03-25, VFI:[id number]  Admit date - 06/22/2018   Admitting Physician Milan Perkins Mariea Clonts, MD  Outpatient Primary MD for the patient is [name], MD  LOS - 1   Chief Complaint  Patient presents with  . Fall  . Hip Pain        Subjective:    [name] today has no fevers, no emesis,  No chest pain, no new concerns,  sleepy postop  Assessment  & Plan :    Principal Problem:   Hip fracture (HCC) Active Problems:   Benign essential HTN   Other specified hypothyroidism   Alzheimer's dementia with behavioral disturbance [facility])   Brief Summary: 85 y.o. female with past medical history relevant for hypertension, advanced dementia with significant cognitive deficits at baseline as well as hypothyroidism admitted on 06/22/2018 with right hip fracture secondary to mechanical fall underwent surgical fixation on 06/23/2018  Plan:- 1)Rt Hip Fx--post mechanical fall--- s/p Cephalo-medullary nailing of right intertrochanteric femur fracture on 06/23/2018--further management per orthopedic team  2)HTN--stable, c/n  amlodipine 10 mg daily,  may use IV Hydralazine 10 mg  Every 4 hours Prn for systolic blood pressure over 160 mmhg  3)Social/Ethics--- patient is a DNR/DNI, verified with patient's son [name] at bedside, patient's other son who lives in [place] is the primary decision maker  4) dementia with advanced cognitive deficits-------- patient may have sundowning may use lorazepam as needed   5) hypothyroidism---  TSH is 3.9, stable,  continue levothyroxine 88 mcg daily  Code Status : DNR  Family Communication:   son   Disposition Plan  : await PT/OT Eval   Consults  :  ortho  DVT Prophylaxis  :   - Heparin -    Lab Results  Component Value Date   PLT 231 06/23/2018    Inpatient  Medications  Scheduled Meds: . amLODipine  10 mg Oral QHS  . bisacodyl  10 mg Rectal Once  . fentaNYL      . heparin  5,000 Units Subcutaneous Q8H  . latanoprost  1 drop Both Eyes QHS  . levothyroxine  88 mcg Oral QAC breakfast  . LORazepam  0.5 mg Oral BID  . methocarbamol  500 mg Oral TID  . multivitamin   Oral Daily  . senna-docusate  2 tablet Oral BID  . sertraline  100 mg Oral Daily  . sodium chloride flush  3 mL Intravenous Q12H   Continuous Infusions: . sodium chloride    . dextrose 5 % and 0.45% NaCl 50 mL/hr at 06/23/18 1722   PRN Meds:.sodium chloride, acetaminophen **OR** acetaminophen, albuterol, hydrALAZINE, LORazepam, morphine injection, ondansetron (ZOFRAN) IV, polyethylene glycol, sodium chloride flush, traZODone   Anti-infectives (From admission, onward)   Start     Dose/Rate Route Frequency Ordered Stop   06/23/18 0710  clindamycin (CLEOCIN) 900 MG/50ML IVPB    Note to Pharmacy:  Veva Holes: cabinet override      06/23/18 0710 06/23/18 0746   06/23/18 0600  clindamycin (CLEOCIN) IVPB 900 mg     900 mg 100 mL/hr over 30 Minutes Intravenous On call to O.R. 06/22/18 2100 06/23/18 0816        Objective:  Vitals:   06/23/18 1147 06/23/18 1236 06/23/18 1339 06/23/18 1738  BP: (!) 108/48 (!) 111/50 (!) 127/59 113/75  Pulse: 64 64 69 65  Resp: 16 16 16 16   Temp: 98.6 F (37 C) 97.9 F (36.6 C) 98.2 F (36.8 C) 98.5 F (36.9 C)  TempSrc:    Oral  SpO2: 94% 94% 100% 100%    Wt Readings from Last 3 Encounters:  04/26/18 68.6 kg  10/10/17 71.7 kg  04/10/17 69.9 kg     Intake/Output Summary (Last 24 hours) at 06/23/2018 1858 Last data filed at 06/23/2018 1722 Gross per 24 hour  Intake 2284.32 ml  Output 1075 ml  Net 1209.32 ml     Physical Exam Patient is examined daily including today on  06/23/18 , exams remain the same as of yesterday except that has changed   Physical Examination: General appearance -awake, elderly and in no  distress Mental status - alert, oriented to person, disoriented to place , baseline cognitive deficits eyes - sclera anicteric Neck - supple, no JVD elevation , Chest - clear  to auscultation bilaterally, symmetrical air movement,  Heart - S1 and S2 normal, regular  Abdomen - soft, nontender, nondistended, no masses or organomegaly Neurological -  neck supple without rigidity, cranial nerves II through XII intact, DTR's normal and symmetric Extremities - no pedal edema noted, intact peripheral pulses  Skin - warm, dry MSK--right hip postop wound is intact  Data Review:   Micro Results Recent Results (from the past 240 hour(s))  Surgical pcr screen     Status: None   Collection Time: 06/23/18  2:47 AM  Result Value Ref Range Status   MRSA, PCR NEGATIVE NEGATIVE Final   Staphylococcus aureus NEGATIVE NEGATIVE Final    Comment: (NOTE) The Xpert SA Assay (FDA approved for NASAL specimens in patients 68 years of age and older), is one component of a comprehensive surveillance program. It is not intended to diagnose infection nor to guide or monitor treatment. Performed at [facility], [address] [place], [place] [address]     Radiology Reports Ct Head Wo Contrast  Result Date: 06/22/2018 CLINICAL DATA:  Larey Seat onto asphalt while attempting to step up onto a curb minor head trauma, high clinical risk; cervical spine trauma, high clinical risk; history dementia, diabetes mellitus, hypertension EXAM: CT HEAD WITHOUT CONTRAST CT CERVICAL SPINE WITHOUT CONTRAST TECHNIQUE: Multidetector CT imaging of the head and cervical spine was performed following the standard protocol without intravenous contrast. Multiplanar CT image reconstructions of the cervical spine were also generated. COMPARISON:  02/21/2018 FINDINGS: CT HEAD FINDINGS Brain: Generalized atrophy. Normal ventricular morphology. No midline shift or mass effect. Small vessel chronic ischemic changes of deep  cerebral white matter. No intracranial hemorrhage, mass lesion, evidence of acute infarction, or extra-axial fluid collection. Vascular: Atherosclerotic calcifications of internal carotid arteries at skull base Skull: Demineralized but intact Sinuses/Orbits: Partial opacification of LEFT sphenoid sinus, chronic. Remaining visualized paranasal sinuses and mastoid air cells clear Other: N/A CT CERVICAL SPINE FINDINGS Alignment: Normal Skull base and vertebrae: Diffuse osseous demineralization. Skull base intact. Multilevel disc space narrowing and endplate spur formation. Vertebral body heights maintained without fracture or bone destruction. Scattered facet degenerative changes bilaterally. Encroachment upon cervical neural foramina bilaterally by uncovertebral spurs. Soft tissues and spinal canal: Prevertebral soft tissues normal thickness. Atherosclerotic calcifications at carotid bifurcations. Small isthmic thyroid nodule 16 mm diameter unchanged Disc levels:  No additional abnormalities Upper chest: Lung apices clear Other: Atherosclerotic calcifications  aortic arch. IMPRESSION: Atrophy with small vessel chronic ischemic changes of deep cerebral white matter. No acute intracranial abnormalities. Degenerative disc and facet disease changes of the cervical spine. No acute cervical spine abnormalities. Stable isthmic thyroid nodule. Aortic Atherosclerosis (ICD10-I70.0). Electronically Signed   By: Ulyses SouthwardMark  Boles M.D.   On: 06/22/2018 15:51   Ct Cervical Spine Wo Contrast  Result Date: 06/22/2018 CLINICAL DATA:  Larey SeatFell onto asphalt while attempting to step up onto a curb minor head trauma, high clinical risk; cervical spine trauma, high clinical risk; history dementia, diabetes mellitus, hypertension EXAM: CT HEAD WITHOUT CONTRAST CT CERVICAL SPINE WITHOUT CONTRAST TECHNIQUE: Multidetector CT imaging of the head and cervical spine was performed following the standard protocol without intravenous contrast. Multiplanar  CT image reconstructions of the cervical spine were also generated. COMPARISON:  02/21/2018 FINDINGS: CT HEAD FINDINGS Brain: Generalized atrophy. Normal ventricular morphology. No midline shift or mass effect. Small vessel chronic ischemic changes of deep cerebral white matter. No intracranial hemorrhage, mass lesion, evidence of acute infarction, or extra-axial fluid collection. Vascular: Atherosclerotic calcifications of internal carotid arteries at skull base Skull: Demineralized but intact Sinuses/Orbits: Partial opacification of LEFT sphenoid sinus, chronic. Remaining visualized paranasal sinuses and mastoid air cells clear Other: N/A CT CERVICAL SPINE FINDINGS Alignment: Normal Skull base and vertebrae: Diffuse osseous demineralization. Skull base intact. Multilevel disc space narrowing and endplate spur formation. Vertebral body heights maintained without fracture or bone destruction. Scattered facet degenerative changes bilaterally. Encroachment upon cervical neural foramina bilaterally by uncovertebral spurs. Soft tissues and spinal canal: Prevertebral soft tissues normal thickness. Atherosclerotic calcifications at carotid bifurcations. Small isthmic thyroid nodule 16 mm diameter unchanged Disc levels:  No additional abnormalities Upper chest: Lung apices clear Other: Atherosclerotic calcifications aortic arch. IMPRESSION: Atrophy with small vessel chronic ischemic changes of deep cerebral white matter. No acute intracranial abnormalities. Degenerative disc and facet disease changes of the cervical spine. No acute cervical spine abnormalities. Stable isthmic thyroid nodule. Aortic Atherosclerosis (ICD10-I70.0). Electronically Signed   By: Ulyses SouthwardMark  Boles M.D.   On: 06/22/2018 15:51   Dg C-arm 1-60 Min-no Report  Result Date: 06/23/2018 Fluoroscopy was utilized by the requesting physician.  No radiographic interpretation.   Dg Hip Operative Unilat W Or W/o Pelvis Right  Result Date: 06/23/2018 CLINICAL  DATA:  Right femoral nail EXAM: OPERATIVE RIGHT HIP (WITH PELVIS IF PERFORMED) 4 VIEWS TECHNIQUE: Fluoroscopic spot image(s) were submitted for interpretation post-operatively. COMPARISON:  the previous day's study FINDINGS: 4 fluoroscopic spot images document placement of IM rod and interlocking sliding screw across the intertrochanteric fracture, major fracture fragments in near anatomic alignment IMPRESSION: Internal fixation of intertrochanteric femur fracture. Electronically Signed   By: Corlis Leak  Hassell M.D.   On: 06/23/2018 09:27   Dg Hip Unilat  With Pelvis 2-3 Views Right  Result Date: 06/22/2018 CLINICAL DATA:  Acute RIGHT hip pain following fall today. Initial encounter. EXAM: DG HIP (WITH OR WITHOUT PELVIS) 2-3V RIGHT COMPARISON:  None. FINDINGS: A comminuted intertrochanteric RIGHT femur fracture is noted with varus angulation. No dislocation. No other fractures are noted. IMPRESSION: Comminuted intertrochanteric RIGHT femur fracture with varus angulation. Electronically Signed   By: Harmon PierJeffrey  Hu M.D.   On: 06/22/2018 17:04   Dg Femur Min 2 Views Right  Result Date: 06/22/2018 CLINICAL DATA:  Acute RIGHT leg pain following fall. Initial encounter. EXAM: RIGHT FEMUR 2 VIEWS COMPARISON:  None. FINDINGS: A comminuted intertrochanteric femur fracture is noted with varus angulation. No other fracture identified. No dislocation. Degenerative changes in the  knee are present. IMPRESSION: Comminuted intertrochanteric RIGHT femur fracture with varus angulation. Electronically Signed   By: Harmon Pier M.D.   On: 06/22/2018 17:05     CBC Recent Labs  Lab 06/22/18 1507 06/23/18 0341  WBC 7.9 9.0  HGB 11.9* 10.4*  HCT 38.4 32.9*  PLT 275 231  MCV 91.4 90.4  MCH 28.3 28.6  MCHC 31.0 31.6  RDW 13.8 13.9  LYMPHSABS 1.2  --   MONOABS 0.6  --   EOSABS 0.1  --   BASOSABS 0.0  --     Chemistries  Recent Labs  Lab 06/22/18 1507 06/23/18 0341  NA 139 136  K 4.1 4.3  CL 103 102  CO2 27 26   GLUCOSE 120* 144*  BUN 25* 21  CREATININE 0.90 0.83  CALCIUM 8.9 8.6*   ------------------------------------------------------------------------------------------------------------------ Recent Labs    06/22/18 2123  TSH 3.949   ------------------------------------------------------------------------------------------------------------------ No results for input(s): VITAMINB12, FOLATE, FERRITIN, TIBC, IRON, RETICCTPCT in the last 72 hours.  Coagulation profile No results for input(s): INR, PROTIME in the last 168 hours.  No results for input(s): DDIMER in the last 72 hours.  Cardiac Enzymes No results for input(s): CKMB, TROPONINI, MYOGLOBIN in the last 168 hours.  Invalid input(s): CK ------------------------------------------------------------------------------------------------------------------ No results found for: BNP   Shon Hale M.D on 06/23/2018 at 6:58 PM  Go to [email or web address] - for contact info  [facility] - Office  [phone]

## 2018-06-23 NOTE — Anesthesia Procedure Notes (Signed)
Spinal  Patient location during procedure: OR Start time: 06/23/2018 7:40 AM End time: 06/23/2018 7:50 AM Staffing Anesthesiologist: Leonides Grills, MD Performed: anesthesiologist  Preanesthetic Checklist Completed: patient identified, surgical consent, pre-op evaluation, timeout performed, IV checked, risks and benefits discussed and monitors and equipment checked Spinal Block Patient position: right lateral decubitus Prep: DuraPrep Patient monitoring: cardiac monitor, continuous pulse ox and blood pressure Approach: right paramedian Location: L4-5 Injection technique: single-shot Needle Needle type: Pencan  Needle gauge: 24 G Needle length: 9 cm Assessment Sensory level: T10 Additional Notes Functioning IV was confirmed and monitors were applied. Sterile prep and drape, including hand hygiene and sterile gloves were used. The patient was positioned and the spine was prepped. The skin was anesthetized with lidocaine.  Free flow of clear CSF was obtained prior to injecting local anesthetic into the CSF.  The spinal needle aspirated freely following injection.  The needle was carefully withdrawn.  The patient tolerated the procedure well.

## 2018-06-23 NOTE — Transfer of Care (Signed)
Immediate Anesthesia Transfer of Care Note  Patient: Colleen Huang  Procedure(s) Performed: INTERTROCHANTERIC NAILING RIGHT FEMUR (Right Hip)  Patient Location: PACU  Anesthesia Type:MAC and Spinal  Level of Consciousness: awake, alert  and oriented  Airway & Oxygen Therapy: Patient Spontanous Breathing and Patient connected to face mask oxygen  Post-op Assessment: Report given to RN and Post -op Vital signs reviewed and stable  Post vital signs: Reviewed and stable  Last Vitals:  Vitals Value Taken Time  BP 99/53 06/23/2018  8:57 AM  Temp    Pulse 60 06/23/2018  8:58 AM  Resp 16 06/23/2018  8:58 AM  SpO2 100 % 06/23/2018  8:58 AM  Vitals shown include unvalidated device data.  Last Pain:  Vitals:   06/23/18 0036  TempSrc:   PainSc: Asleep      Patients Stated Pain Goal: 3 (25/85/27 7824)  Complications: No apparent anesthesia complications

## 2018-06-23 NOTE — Anesthesia Postprocedure Evaluation (Signed)
Anesthesia Post Note  Patient: Gwyndolyn KaufmanMarion S Tomasik  Procedure(s) Performed: INTERTROCHANTERIC NAILING RIGHT FEMUR (Right Hip)     Patient location during evaluation: PACU Anesthesia Type: Spinal Level of consciousness: oriented and awake Pain management: pain level controlled Vital Signs Assessment: post-procedure vital signs reviewed and stable Respiratory status: spontaneous breathing, respiratory function stable and patient connected to nasal cannula oxygen Cardiovascular status: blood pressure returned to baseline and stable Postop Assessment: no headache, no backache and no apparent nausea or vomiting Anesthetic complications: no    Last Vitals:  Vitals:   06/23/18 1236 06/23/18 1339  BP: (!) 111/50 (!) 127/59  Pulse: 64 69  Resp: 16 16  Temp: 36.6 C 36.8 C  SpO2: 94% 100%    Last Pain:  Vitals:   06/23/18 1457  TempSrc:   PainSc: 2                  Jannatul Wojdyla P Ibrahima Holberg

## 2018-06-24 LAB — CBC
HCT: 31.5 % — ABNORMAL LOW (ref 36.0–46.0)
Hemoglobin: 9.8 g/dL — ABNORMAL LOW (ref 12.0–15.0)
MCH: 28.7 pg (ref 26.0–34.0)
MCHC: 31.1 g/dL (ref 30.0–36.0)
MCV: 92.4 fL (ref 80.0–100.0)
NRBC: 0 % (ref 0.0–0.2)
Platelets: 164 10*3/uL (ref 150–400)
RBC: 3.41 MIL/uL — ABNORMAL LOW (ref 3.87–5.11)
RDW: 14 % (ref 11.5–15.5)
WBC: 9.2 10*3/uL (ref 4.0–10.5)

## 2018-06-24 MED ORDER — CALCIUM CARBONATE ANTACID 500 MG PO CHEW
1.0000 | CHEWABLE_TABLET | Freq: Two times a day (BID) | ORAL | Status: DC
  Administered 2018-06-24 – 2018-06-26 (×5): 200 mg via ORAL
  Filled 2018-06-24 (×5): qty 1

## 2018-06-24 NOTE — Progress Notes (Signed)
Subjective: 1 Day Post-Op Procedure(s) (LRB): INTERTROCHANTERIC NAILING RIGHT FEMUR (Right) Patient reports no pain in the right hip.  She has not worked with physical therapy yet.  The proximal dressing did have some saturations overnight.  Difficult to obtain interval history due to demented status.  Family at bedside states she has been doing well.  Objective: Vital signs in last 24 hours: Temp:  [97.4 F (36.3 C)-98.6 F (37 C)] 98.5 F (36.9 C) (01/12 0958) Pulse Rate:  [64-69] 64 (01/12 0958) Resp:  [15-18] 18 (01/12 0958) BP: (108-132)/(48-75) 117/51 (01/12 0958) SpO2:  [94 %-100 %] 100 % (01/12 0958)  Intake/Output from previous day: 01/11 0701 - 01/12 0700 In: 2756.3 [P.O.:730; I.V.:1976.3; IV Piggyback:50] Out: 1875 [Urine:1850; Blood:25] Intake/Output this shift: Total I/O In: 623.1 [P.O.:360; I.V.:263.1] Out: 200 [Urine:200]  Recent Labs    06/22/18 1507 06/23/18 0341  HGB 11.9* 10.4*   Recent Labs    06/22/18 1507 06/23/18 0341  WBC 7.9 9.0  RBC 4.20 3.64*  HCT 38.4 32.9*  PLT 275 231   Recent Labs    06/22/18 1507 06/23/18 0341  NA 139 136  K 4.1 4.3  CL 103 102  CO2 27 26  BUN 25* 21  CREATININE 0.90 0.83  GLUCOSE 120* 144*  CALCIUM 8.9 8.6*    Evaluation of the right lower extremity demonstrates good resting posture of the hip and thigh.  Saturations to the proximal dressing that is been reinforced.  Small spot of saturation of the distal dressing.  No tenderness palpation about the knee, leg or ankle.  She is able to actively move the right lower extremity but does not follow detailed motor and sensation exam.  Assessment/Plan: 1 Day Post-Op Procedure(s) (LRB): INTERTROCHANTERIC NAILING RIGHT FEMUR (Right)  Patient is doing well overall.  Her hemoglobin last night was 10.4 awaiting hemoglobin today.  The proximal dressing is oozing somewhat we will continue to reinforce the dressing.  She will mobilize with physical therapy and work on  disposition.  She will likely require SNF placement.  She is weightbearing as tolerated on the right lower extremity.  Once discharged she will follow-up with me in 2 weeks for x-rays and suture removal. For discharge DVT prophylaxis she will be given 1 325 mg aspirin per day.      Terance Hart 06/24/2018, 11:31 AM

## 2018-06-24 NOTE — Evaluation (Signed)
Physical Therapy Evaluation Patient Details Name: Colleen Huang MRN: [id number] DOB: 06-01-1924 Today's Date: 06/24/2018   History of Present Illness  85 year old female with history of dementia, DM, HTN,  who fell going up the stairs on her right hip resulting in R IT  hip fx; s/p IT nail R femur per Dr Susa Simmonds oin 06/23/18  Clinical Impression  Pt admitted with above diagnosis. Pt currently with functional limitations due to the deficits listed below (see PT Problem List). Limited ability to participate d/t dementia and issues with pain, also with some nausea and dry heaves while sitting EOB, RN aware. Pt did have nausea meds before PT session.  Pt will benefit from skilled PT to increase their independence and safety with mobility to allow discharge to the venue listed below.       Follow Up Recommendations SNF    Equipment Recommendations  None recommended by PT    Recommendations for Other Services       Precautions / Restrictions Precautions Precautions: Fall Restrictions RLE Weight Bearing: Weight bearing as tolerated      Mobility  Bed Mobility Overal bed mobility: Needs Assistance Bed Mobility: Supine to Sit;Sit to Supine     Supine to sit: +2 for safety/equipment;+2 for physical assistance;Max assist;Total assist Sit to supine: +2 for physical assistance;+2 for safety/equipment;Total assist   General bed mobility comments: pt able to flex left knee and initiate assist with scooting, bed pad utilized, assist with LEs and trunk in both directions  Transfers                 General transfer comment: attempted to stand, unable d/t pain  Ambulation/Gait                Stairs            Wheelchair Mobility    Modified Rankin (Stroke Patients Only)       Balance Overall balance assessment: Needs assistance;History of Falls Sitting-balance support: Bilateral upper extremity supported;Feet unsupported Sitting balance-Leahy Scale:  Poor Sitting balance - Comments: posterior LOB, able to briefly maintain static sit with close supervision Postural control: Posterior lean                                   Pertinent Vitals/Pain Pain Assessment: Faces Faces Pain Scale: Hurts worst Pain Location: right hip Pain Descriptors / Indicators: Grimacing;Guarding;Moaning Pain Intervention(s): Limited activity within patient's tolerance;Monitored during session;Premedicated before session;Repositioned;Ice applied    Home Living Family/patient expects to be discharged to:: Skilled nursing facility                 Additional Comments: lives with family, amb with and without RW, limited household amb per pt family    Prior Function                 Hand Dominance        Extremity/Trunk Assessment   Upper Extremity Assessment Upper Extremity Assessment: Overall WFL for tasks assessed    Lower Extremity Assessment Lower Extremity Assessment: RLE deficits/detail;LLE deficits/detail RLE: Unable to fully assess due to pain LLE Deficits / Details: WFL for activites tested       Communication   Communication: HOH  Cognition Arousal/Alertness: Awake/alert Behavior During Therapy: WFL for tasks assessed/performed Overall Cognitive Status: History of cognitive impairments - at baseline Area of Impairment: Following commands;Memory  Memory: Decreased short-term memory Following Commands: Follows one step commands inconsistently;Follows multi-step commands inconsistently              General Comments      Exercises     Assessment/Plan    PT Assessment Patient needs continued PT services  PT Problem List Decreased strength;Decreased mobility;Decreased activity tolerance;Decreased knowledge of use of DME;Decreased safety awareness;Decreased cognition       PT Treatment Interventions DME instruction;Gait training;Functional mobility training;Therapeutic  activities;Therapeutic exercise;Patient/family education;Balance training    PT Goals (Current goals can be found in the Care Plan section)  Acute Rehab PT Goals Patient Stated Goal: unable to state PT Goal Formulation: With family Time For Goal Achievement: 07/08/18 Potential to Achieve Goals: Fair    Frequency Min 3X/week   Barriers to discharge        Co-evaluation               AM-PAC PT "6 Clicks" Mobility  Outcome Measure Help needed turning from your back to your side while in a flat bed without using bedrails?: A Lot Help needed moving from lying on your back to sitting on the side of a flat bed without using bedrails?: A Lot Help needed moving to and from a bed to a chair (including a wheelchair)?: A Lot Help needed standing up from a chair using your arms (e.g., wheelchair or bedside chair)?: A Lot Help needed to walk in hospital room?: A Lot Help needed climbing 3-5 steps with a railing? : A Lot 6 Click Score: 12    End of Session Equipment Utilized During Treatment: Gait belt Activity Tolerance: Patient limited by pain;Other (comment)(cognition) Patient left: in bed;with bed alarm set;with call bell/phone within reach Nurse Communication: Mobility status PT Visit Diagnosis: Unsteadiness on feet (R26.81);Pain;History of falling (Z91.81) Pain - Right/Left: Right Pain - part of body: Hip    Time: 3875-6433 PT Time Calculation (min) (ACUTE ONLY): 23 min   Charges:   PT Evaluation $PT Eval Low Complexity: 1 Low PT Treatments $Neuromuscular Re-education: 8-22 mins        Drucilla Chalet, PT  Pager: [phone] Acute Rehab Dept [facility]): [phone]   06/24/2018   [facility] 06/24/2018, 5:25 PM

## 2018-06-24 NOTE — Plan of Care (Signed)
Pt stable with no needs. No changes to note. Pt remains weak, tired, and confused with intermittent nausea. Poor po intake also remains. No s/s of distress. Rn medicated for pain.

## 2018-06-24 NOTE — Progress Notes (Signed)
Dr. Cheryll Dessert notified of pt heartburn and feeling like something is stuck. Her swallowing and breathing are within normal limits. Dr. Susa Simmonds also notified of pt oozing upper hip wound. He wants nursing staff to reinforce dressing as needed for oozing. Pt stable at this time. Will continue to monitor.

## 2018-06-24 NOTE — Progress Notes (Signed)
Patient Demographics:    Colleen Huang, is a 85 y.o. female, DOB - 1924/04/25, ZDG:[id number]  Admit date - 06/22/2018   Admitting Physician Chalyn Amescua Mariea Clonts, MD  Outpatient Primary MD for the patient is [name], MD  LOS - 2   Chief Complaint  Patient presents with  . Fall  . Hip Pain        Subjective:    [name] today has no fevers, no emesis,  No chest pain, no new concerns, caregiver at bedside  Assessment  & Plan :    Principal Problem:   Hip fracture [facility]) Active Problems:   Benign essential HTN   Other specified hypothyroidism   Alzheimer's dementia with behavioral disturbance [facility])   Brief Summary: 85 y.o. female with past medical history relevant for hypertension, advanced dementia with significant cognitive deficits at baseline as well as hypothyroidism admitted on 06/22/2018 with right hip fracture secondary to mechanical fall underwent surgical fixation on 06/23/2018, now awaiting SNF placement for rehab  Plan:- 1)Rt Hip Fx--post mechanical fall--- s/p Cephalo-medullary nailing of right intertrochanteric femur fracture on 06/23/2018--further management per orthopedic team  2)HTN--stable, c/n  amlodipine 10 mg daily,  may use IV Hydralazine 10 mg  Every 4 hours Prn for systolic blood pressure over 160 mmhg  3)Social/Ethics--- patient is a DNR/DNI, verified with patient's son [name] at bedside, patient's other son who lives in [place] is the primary decision maker, patient has caregivers usually at bedside  4) dementia with advanced cognitive deficits-------- patient may have sundowning may use lorazepam as needed   5) hypothyroidism---  TSH is 3.9, stable,  continue levothyroxine 88 mcg daily  Code Status : DNR  Family Communication:   son   Disposition Plan  : PT/OT Eval noted, awaiting SNF rehab placement  Consults  :  ortho  DVT Prophylaxis  :    - Heparin -    Lab Results  Component Value Date   PLT 164 06/24/2018    Inpatient Medications  Scheduled Meds: . amLODipine  10 mg Oral QHS  . bisacodyl  10 mg Rectal Once  . calcium carbonate  1 tablet Oral BID  . heparin  5,000 Units Subcutaneous Q8H  . latanoprost  1 drop Both Eyes QHS  . levothyroxine  88 mcg Oral QAC breakfast  . LORazepam  0.5 mg Oral BID  . methocarbamol  500 mg Oral TID  . multivitamin   Oral Daily  . senna-docusate  2 tablet Oral BID  . sertraline  100 mg Oral Daily  . sodium chloride flush  3 mL Intravenous Q12H   Continuous Infusions: . sodium chloride    . dextrose 5 % and 0.45% NaCl 50 mL/hr at 06/24/18 1725   PRN Meds:.sodium chloride, acetaminophen **OR** acetaminophen, albuterol, hydrALAZINE, LORazepam, morphine injection, ondansetron (ZOFRAN) IV, polyethylene glycol, sodium chloride flush, traZODone   Anti-infectives (From admission, onward)   Start     Dose/Rate Route Frequency Ordered Stop   06/23/18 0710  clindamycin (CLEOCIN) 900 MG/50ML IVPB    Note to Pharmacy:  Veva Holes: cabinet override      06/23/18 0710 06/23/18 0746   06/23/18 0600  clindamycin (CLEOCIN) IVPB 900 mg     900 mg 100 mL/hr over 30 Minutes  Intravenous On call to O.R. 06/22/18 2100 06/23/18 0816        Objective:   Vitals:   06/23/18 2135 06/24/18 0503 06/24/18 0958 06/24/18 1519  BP: (!) 127/53 (!) 132/53 (!) 117/51 (!) 118/46  Pulse: 69 69 64 69  Resp: 16 15 18 16   Temp: (!) 97.4 F (36.3 C) 98.6 F (37 C) 98.5 F (36.9 C) 98.4 F (36.9 C)  TempSrc: Oral Oral    SpO2: 100% 99% 100% 92%    Wt Readings from Last 3 Encounters:  04/26/18 68.6 kg  10/10/17 71.7 kg  04/10/17 69.9 kg     Intake/Output Summary (Last 24 hours) at 06/24/2018 1845 Last data filed at 06/24/2018 1725 Gross per 24 hour  Intake 2408.08 ml  Output 2400 ml  Net 8.08 ml     Physical Exam Patient is examined daily including today on  06/24/18 , exams remain  the same as of yesterday except that has changed   Physical Examination: General appearance -awake, elderly and in no distress Mental status - alert, oriented to person, disoriented to place , baseline cognitive deficits eyes - sclera anicteric Neck - supple, no JVD elevation , Chest - clear  to auscultation bilaterally, symmetrical air movement,  Heart - S1 and S2 normal, regular  Abdomen - soft, nontender, nondistended, no masses or organomegaly Neurological -  neck supple without rigidity, cranial nerves II through XII intact, DTR's normal and symmetric Extremities - no pedal edema noted, intact peripheral pulses  Skin - warm, dry MSK--right hip postop wound is clean, dry, intact  Data Review:   Micro Results Recent Results (from the past 240 hour(s))  Surgical pcr screen     Status: None   Collection Time: 06/23/18  2:47 AM  Result Value Ref Range Status   MRSA, PCR NEGATIVE NEGATIVE Final   Staphylococcus aureus NEGATIVE NEGATIVE Final    Comment: (NOTE) The Xpert SA Assay (FDA approved for NASAL specimens in patients 83 years of age and older), is one component of a comprehensive surveillance program. It is not intended to diagnose infection nor to guide or monitor treatment. Performed at [facility], [address] [place], [place] [address]     Radiology Reports Ct Head Wo Contrast  Result Date: 06/22/2018 CLINICAL DATA:  Larey SeatFell onto asphalt while attempting to step up onto a curb minor head trauma, high clinical risk; cervical spine trauma, high clinical risk; history dementia, diabetes mellitus, hypertension EXAM: CT HEAD WITHOUT CONTRAST CT CERVICAL SPINE WITHOUT CONTRAST TECHNIQUE: Multidetector CT imaging of the head and cervical spine was performed following the standard protocol without intravenous contrast. Multiplanar CT image reconstructions of the cervical spine were also generated. COMPARISON:  02/21/2018 FINDINGS: CT HEAD FINDINGS Brain:  Generalized atrophy. Normal ventricular morphology. No midline shift or mass effect. Small vessel chronic ischemic changes of deep cerebral white matter. No intracranial hemorrhage, mass lesion, evidence of acute infarction, or extra-axial fluid collection. Vascular: Atherosclerotic calcifications of internal carotid arteries at skull base Skull: Demineralized but intact Sinuses/Orbits: Partial opacification of LEFT sphenoid sinus, chronic. Remaining visualized paranasal sinuses and mastoid air cells clear Other: N/A CT CERVICAL SPINE FINDINGS Alignment: Normal Skull base and vertebrae: Diffuse osseous demineralization. Skull base intact. Multilevel disc space narrowing and endplate spur formation. Vertebral body heights maintained without fracture or bone destruction. Scattered facet degenerative changes bilaterally. Encroachment upon cervical neural foramina bilaterally by uncovertebral spurs. Soft tissues and spinal canal: Prevertebral soft tissues normal thickness. Atherosclerotic calcifications at carotid  bifurcations. Small isthmic thyroid nodule 16 mm diameter unchanged Disc levels:  No additional abnormalities Upper chest: Lung apices clear Other: Atherosclerotic calcifications aortic arch. IMPRESSION: Atrophy with small vessel chronic ischemic changes of deep cerebral white matter. No acute intracranial abnormalities. Degenerative disc and facet disease changes of the cervical spine. No acute cervical spine abnormalities. Stable isthmic thyroid nodule. Aortic Atherosclerosis (ICD10-I70.0). Electronically Signed   By: Ulyses Southward M.D.   On: 06/22/2018 15:51   Ct Cervical Spine Wo Contrast  Result Date: 06/22/2018 CLINICAL DATA:  Larey Seat onto asphalt while attempting to step up onto a curb minor head trauma, high clinical risk; cervical spine trauma, high clinical risk; history dementia, diabetes mellitus, hypertension EXAM: CT HEAD WITHOUT CONTRAST CT CERVICAL SPINE WITHOUT CONTRAST TECHNIQUE: Multidetector  CT imaging of the head and cervical spine was performed following the standard protocol without intravenous contrast. Multiplanar CT image reconstructions of the cervical spine were also generated. COMPARISON:  02/21/2018 FINDINGS: CT HEAD FINDINGS Brain: Generalized atrophy. Normal ventricular morphology. No midline shift or mass effect. Small vessel chronic ischemic changes of deep cerebral white matter. No intracranial hemorrhage, mass lesion, evidence of acute infarction, or extra-axial fluid collection. Vascular: Atherosclerotic calcifications of internal carotid arteries at skull base Skull: Demineralized but intact Sinuses/Orbits: Partial opacification of LEFT sphenoid sinus, chronic. Remaining visualized paranasal sinuses and mastoid air cells clear Other: N/A CT CERVICAL SPINE FINDINGS Alignment: Normal Skull base and vertebrae: Diffuse osseous demineralization. Skull base intact. Multilevel disc space narrowing and endplate spur formation. Vertebral body heights maintained without fracture or bone destruction. Scattered facet degenerative changes bilaterally. Encroachment upon cervical neural foramina bilaterally by uncovertebral spurs. Soft tissues and spinal canal: Prevertebral soft tissues normal thickness. Atherosclerotic calcifications at carotid bifurcations. Small isthmic thyroid nodule 16 mm diameter unchanged Disc levels:  No additional abnormalities Upper chest: Lung apices clear Other: Atherosclerotic calcifications aortic arch. IMPRESSION: Atrophy with small vessel chronic ischemic changes of deep cerebral white matter. No acute intracranial abnormalities. Degenerative disc and facet disease changes of the cervical spine. No acute cervical spine abnormalities. Stable isthmic thyroid nodule. Aortic Atherosclerosis (ICD10-I70.0). Electronically Signed   By: Ulyses Southward M.D.   On: 06/22/2018 15:51   Dg C-arm 1-60 Min-no Report  Result Date: 06/23/2018 Fluoroscopy was utilized by the requesting  physician.  No radiographic interpretation.   Dg Hip Operative Unilat W Or W/o Pelvis Right  Result Date: 06/23/2018 CLINICAL DATA:  Right femoral nail EXAM: OPERATIVE RIGHT HIP (WITH PELVIS IF PERFORMED) 4 VIEWS TECHNIQUE: Fluoroscopic spot image(s) were submitted for interpretation post-operatively. COMPARISON:  the previous day's study FINDINGS: 4 fluoroscopic spot images document placement of IM rod and interlocking sliding screw across the intertrochanteric fracture, major fracture fragments in near anatomic alignment IMPRESSION: Internal fixation of intertrochanteric femur fracture. Electronically Signed   By: Corlis Leak M.D.   On: 06/23/2018 09:27   Dg Hip Unilat  With Pelvis 2-3 Views Right  Result Date: 06/22/2018 CLINICAL DATA:  Acute RIGHT hip pain following fall today. Initial encounter. EXAM: DG HIP (WITH OR WITHOUT PELVIS) 2-3V RIGHT COMPARISON:  None. FINDINGS: A comminuted intertrochanteric RIGHT femur fracture is noted with varus angulation. No dislocation. No other fractures are noted. IMPRESSION: Comminuted intertrochanteric RIGHT femur fracture with varus angulation. Electronically Signed   By: Harmon Pier M.D.   On: 06/22/2018 17:04   Dg Femur Min 2 Views Right  Result Date: 06/22/2018 CLINICAL DATA:  Acute RIGHT leg pain following fall. Initial encounter. EXAM: RIGHT FEMUR 2 VIEWS COMPARISON:  None. FINDINGS: A comminuted intertrochanteric femur fracture is noted with varus angulation. No other fracture identified. No dislocation. Degenerative changes in the knee are present. IMPRESSION: Comminuted intertrochanteric RIGHT femur fracture with varus angulation. Electronically Signed   By: Harmon Pier M.D.   On: 06/22/2018 17:05     CBC Recent Labs  Lab 06/22/18 1507 06/23/18 0341 06/24/18 1207  WBC 7.9 9.0 9.2  HGB 11.9* 10.4* 9.8*  HCT 38.4 32.9* 31.5*  PLT 275 231 164  MCV 91.4 90.4 92.4  MCH 28.3 28.6 28.7  MCHC 31.0 31.6 31.1  RDW 13.8 13.9 14.0  LYMPHSABS 1.2  --    --   MONOABS 0.6  --   --   EOSABS 0.1  --   --   BASOSABS 0.0  --   --     Chemistries  Recent Labs  Lab 06/22/18 1507 06/23/18 0341  NA 139 136  K 4.1 4.3  CL 103 102  CO2 27 26  GLUCOSE 120* 144*  BUN 25* 21  CREATININE 0.90 0.83  CALCIUM 8.9 8.6*   ------------------------------------------------------------------------------------------------------------------ Recent Labs    06/22/18 2123  TSH 3.949   Shon Hale M.D on 06/24/2018 at 6:45 PM  Go to [email or web address] - for contact info  [facility] - Office  [phone]

## 2018-06-25 MED ORDER — ASPIRIN 325 MG PO TABS: 325.0000 mg | ORAL_TABLET | Freq: Every day | ORAL | 11 refills | Status: DC

## 2018-06-25 NOTE — Progress Notes (Signed)
Physical Therapy Treatment Patient Details Name: Colleen KaufmanMarion S Huang MRN: [id number] DOB: 06/22/1923 Today's Date: 06/25/2018    History of Present Illness 85 year old female with history of dementia, DM, HTN,  who fell going up the stairs on her right hip resulting in R IT  hip fx; s/p IT nail R femur per Dr Susa SimmondsAdair oin 06/23/18    PT Comments    POD # 2 Assisted OOB to recliner only due to pain/anxiety/fear of falling. General bed mobility comments: with increased, increased time and functional VC's assisted pt from supine to EOB + 2 assist and using bed pad to swival.  Once EOB, pt present with Mod extensor tone/posterior lean.  Pt present with inablility to forward flex at hips and Mod guarding due to pain and fear of falling forward.  Pt sat EOB x 7 min to allow position change and to encourage 90 degree sitting.   Positioned in recliner and applied ICE.   Follow Up Recommendations  SNF     Equipment Recommendations  None recommended by PT    Recommendations for Other Services       Precautions / Restrictions Precautions Precautions: Fall Precaution Comments: Hx dementia but was following all instructions, HOH Restrictions Weight Bearing Restrictions: No RLE Weight Bearing: Weight bearing as tolerated    Mobility  Bed Mobility Overal bed mobility: Needs Assistance General transfer comment: attempted sit to stand with walker however limited by pain and fear.  "Bear Hug" 1/4 turn to pt's Left from elevated bed to recliner, pt grimacing with pain and pushing due to fear of falling. Bed Mobility: Supine to Sit     Supine to sit: Max assist;Total assist;+2 for physical assistance;+2 for safety/equipment     General bed mobility comments: with increased, increased time and functional VC's assisted pt from supine to EOB + 2 assist and using bed pad to swival.  Once EOB, pt present with Mod extensor tone/posterior lean.  Pt present with inablility to forward flex at hips and Mod  guarding due to pain and fear of falling forward.  Pt sat EOB x 7 min to allow position change and to encourage 90 degree sitting.    Transfers                 General transfer comment: attempted sit to stand with walker however limited by pain and fear.  "Bear Hug" 1/4 turn to pt's Left from elevated bed to recliner, pt grimacing with pain and pushing due to fear of falling.  Ambulation/Gait             General Gait Details: unable to attempt due to transfer level   Stairs             Wheelchair Mobility    Modified Rankin (Stroke Patients Only)       Balance                                            Cognition Arousal/Alertness: Awake/alert Behavior During Therapy: WFL for tasks assessed/performed Overall Cognitive Status: Within Functional Limits for tasks assessed                                 General Comments: Hx dementia however following all instructions      Exercises      General  Comments        Pertinent Vitals/Pain Pain Assessment: Faces Faces Pain Scale: Hurts whole lot Pain Location: right hip/right knee Pain Descriptors / Indicators: Grimacing;Guarding;Moaning;Operative site guarding Pain Intervention(s): Monitored during session;Repositioned;Ice applied;Premedicated before session    Home Living                      Prior Function            PT Goals (current goals can now be found in the care plan section) Progress towards PT goals: Progressing toward goals    Frequency    Min 3X/week      PT Plan Current plan remains appropriate    Co-evaluation              AM-PAC PT "6 Clicks" Mobility   Outcome Measure  Help needed turning from your back to your side while in a flat bed without using bedrails?: A Lot Help needed moving from lying on your back to sitting on the side of a flat bed without using bedrails?: A Lot Help needed moving to and from a bed to a chair  (including a wheelchair)?: Total Help needed standing up from a chair using your arms (e.g., wheelchair or bedside chair)?: Total Help needed to walk in hospital room?: Total Help needed climbing 3-5 steps with a railing? : Total 6 Click Score: 8    End of Session Equipment Utilized During Treatment: Gait belt Activity Tolerance: Patient limited by pain;Other (comment)(fear/anxiety) Patient left: in chair;with call bell/phone within reach;with chair alarm set;with family/visitor present Nurse Communication: Mobility status PT Visit Diagnosis: Unsteadiness on feet (R26.81);Pain;History of falling (Z91.81) Pain - Right/Left: Right Pain - part of body: Hip     Time: [id number] PT Time Calculation (min) (ACUTE ONLY): 24 min  Charges:  $Therapeutic Activity: 23-37 mins                     Felecia ShellingLori Aviyon Hocevar  PTA Acute  Rehabilitation Services Pager      [phone] Office      [phone]

## 2018-06-25 NOTE — Progress Notes (Signed)
Patient Demographics:    Colleen Huang, is a 85 y.o. female, DOB - [birth year], CVU:[id number]  Admit date - 06/22/2018   Admitting Physician Staisha Winiarski Mariea Clonts, MD  Outpatient Primary MD for the patient is [name], MD  LOS - 3  Chief Complaint  Patient presents with  . Fall  . Hip Pain        Subjective:    Colleen Huang today has no fevers, no emesis,  No chest pain, no new concerns, caregiver at bedside, eating well,   Assessment  & Plan :    Principal Problem:   Hip fracture (HCC) Active Problems:   Benign essential HTN   Other specified hypothyroidism   Alzheimer's dementia with behavioral disturbance [facility])   Brief Summary: 85 y.o. female with past medical history relevant for hypertension, advanced dementia with significant cognitive deficits at baseline as well as hypothyroidism admitted on 06/22/2018 with right hip fracture secondary to mechanical fall underwent surgical fixation on 06/23/2018, now awaiting SNF placement for rehab  Plan:- 1)Rt Hip Fx--post mechanical Fall--- s/p Cephalo-medullary nailing of right intertrochanteric femur fracture on 06/23/2018-- further management per orthopedic team  2)HTN--stable, c/n  amlodipine 10 mg daily, may use IV Hydralazine 10 mg  Every 4 hours Prn for systolic blood pressure over 160 mmhg  3)Social/Ethics--- patient is a DNR/DNI, verified with patient's son [name] at bedside, patient's other son who lives in [place] is the primary decision maker, patient has caregivers usually at bedside  4)Dementia with advanced cognitive deficits-------- patient may have sundowning may use lorazepam as needed   5)Hypothyroidism---  TSH is 3.9, stable,  continue levothyroxine 88 mcg daily  Code Status : DNR  Family Communication:   Son and caregiver at bedside   Disposition Plan  : PT/OT Eval noted, awaiting SNF rehab  placement  Consults  :  ortho  DVT Prophylaxis  :   - Heparin -    Lab Results  Component Value Date   PLT 164 06/24/2018    Inpatient Medications  Scheduled Meds: . amLODipine  10 mg Oral QHS  . bisacodyl  10 mg Rectal Once  . calcium carbonate  1 tablet Oral BID  . heparin  5,000 Units Subcutaneous Q8H  . latanoprost  1 drop Both Eyes QHS  . levothyroxine  88 mcg Oral QAC breakfast  . LORazepam  0.5 mg Oral BID  . methocarbamol  500 mg Oral TID  . multivitamin   Oral Daily  . senna-docusate  2 tablet Oral BID  . sertraline  100 mg Oral Daily  . sodium chloride flush  3 mL Intravenous Q12H   Continuous Infusions: . sodium chloride 10 mL/hr at 06/25/18 0939  . dextrose 5 % and 0.45% NaCl 10 mL/hr at 06/25/18 1001   PRN Meds:.sodium chloride, acetaminophen **OR** acetaminophen, albuterol, hydrALAZINE, LORazepam, morphine injection, ondansetron (ZOFRAN) IV, polyethylene glycol, sodium chloride flush, traZODone   Anti-infectives (From admission, onward)   Start     Dose/Rate Route Frequency Ordered Stop   06/23/18 0710  clindamycin (CLEOCIN) 900 MG/50ML IVPB    Note to Pharmacy:  Veva Holes: cabinet override      06/23/18 0710 06/23/18 0746   06/23/18 0600  clindamycin (CLEOCIN) IVPB 900 mg  900 mg 100 mL/hr over 30 Minutes Intravenous On call to O.R. 06/22/18 2100 06/23/18 0816        Objective:   Vitals:   06/24/18 1519 06/24/18 2116 06/25/18 0541 06/25/18 1335  BP: (!) 118/46 (!) 112/48 115/90 (!) 140/57  Pulse: 69 65 70 75  Resp: 16 16 16 16   Temp: 98.4 F (36.9 C) 98.5 F (36.9 C) 98.6 F (37 C) 97.8 F (36.6 C)  TempSrc:  Oral    SpO2: 92% 94% 95% 100%    Wt Readings from Last 3 Encounters:  04/26/18 68.6 kg  10/10/17 71.7 kg  04/10/17 69.9 kg    Intake/Output Summary (Last 24 hours) at 06/25/2018 1832 Last data filed at 06/25/2018 1805 Gross per 24 hour  Intake 1677.84 ml  Output 1150 ml  Net 527.84 ml   Physical Exam Patient  is examined daily including today on  06/25/18 , exams remain the same as of yesterday except that has changed   Physical Examination: General appearance -awake, elderly and in no distress Mental status - alert, oriented to person, disoriented to place , baseline cognitive deficits eyes - sclera anicteric Neck - supple, no JVD elevation , Chest - clear  to auscultation bilaterally, symmetrical air movement,  Heart - S1 and S2 normal, regular  Abdomen - soft, nontender, nondistended, no masses or organomegaly Neurological -  neck supple without rigidity, cranial nerves II through XII intact, DTR's normal and symmetric Extremities - no pedal edema noted, intact peripheral pulses  Skin - warm, dry MSK--right hip postop wound is clean, dry, intact  Data Review:   Micro Results Recent Results (from the past 240 hour(s))  Surgical pcr screen     Status: None   Collection Time: 06/23/18  2:47 AM  Result Value Ref Range Status   MRSA, PCR NEGATIVE NEGATIVE Final   Staphylococcus aureus NEGATIVE NEGATIVE Final    Comment: (NOTE) The Xpert SA Assay (FDA approved for NASAL specimens in patients 83 years of age and older), is one component of a comprehensive surveillance program. It is not intended to diagnose infection nor to guide or monitor treatment. Performed at [facility], [address] [place], [place] [address]     Radiology Reports Ct Head Wo Contrast  Result Date: 06/22/2018 CLINICAL DATA:  Larey SeatFell onto asphalt while attempting to step up onto a curb minor head trauma, high clinical risk; cervical spine trauma, high clinical risk; history dementia, diabetes mellitus, hypertension EXAM: CT HEAD WITHOUT CONTRAST CT CERVICAL SPINE WITHOUT CONTRAST TECHNIQUE: Multidetector CT imaging of the head and cervical spine was performed following the standard protocol without intravenous contrast. Multiplanar CT image reconstructions of the cervical spine were also generated.  COMPARISON:  02/21/2018 FINDINGS: CT HEAD FINDINGS Brain: Generalized atrophy. Normal ventricular morphology. No midline shift or mass effect. Small vessel chronic ischemic changes of deep cerebral white matter. No intracranial hemorrhage, mass lesion, evidence of acute infarction, or extra-axial fluid collection. Vascular: Atherosclerotic calcifications of internal carotid arteries at skull base Skull: Demineralized but intact Sinuses/Orbits: Partial opacification of LEFT sphenoid sinus, chronic. Remaining visualized paranasal sinuses and mastoid air cells clear Other: N/A CT CERVICAL SPINE FINDINGS Alignment: Normal Skull base and vertebrae: Diffuse osseous demineralization. Skull base intact. Multilevel disc space narrowing and endplate spur formation. Vertebral body heights maintained without fracture or bone destruction. Scattered facet degenerative changes bilaterally. Encroachment upon cervical neural foramina bilaterally by uncovertebral spurs. Soft tissues and spinal canal: Prevertebral soft tissues normal thickness. Atherosclerotic calcifications  at carotid bifurcations. Small isthmic thyroid nodule 16 mm diameter unchanged Disc levels:  No additional abnormalities Upper chest: Lung apices clear Other: Atherosclerotic calcifications aortic arch. IMPRESSION: Atrophy with small vessel chronic ischemic changes of deep cerebral white matter. No acute intracranial abnormalities. Degenerative disc and facet disease changes of the cervical spine. No acute cervical spine abnormalities. Stable isthmic thyroid nodule. Aortic Atherosclerosis (ICD10-I70.0). Electronically Signed   By: Ulyses Southward M.D.   On: 06/22/2018 15:51   Ct Cervical Spine Wo Contrast  Result Date: 06/22/2018 CLINICAL DATA:  Larey Seat onto asphalt while attempting to step up onto a curb minor head trauma, high clinical risk; cervical spine trauma, high clinical risk; history dementia, diabetes mellitus, hypertension EXAM: CT HEAD WITHOUT CONTRAST  CT CERVICAL SPINE WITHOUT CONTRAST TECHNIQUE: Multidetector CT imaging of the head and cervical spine was performed following the standard protocol without intravenous contrast. Multiplanar CT image reconstructions of the cervical spine were also generated. COMPARISON:  02/21/2018 FINDINGS: CT HEAD FINDINGS Brain: Generalized atrophy. Normal ventricular morphology. No midline shift or mass effect. Small vessel chronic ischemic changes of deep cerebral white matter. No intracranial hemorrhage, mass lesion, evidence of acute infarction, or extra-axial fluid collection. Vascular: Atherosclerotic calcifications of internal carotid arteries at skull base Skull: Demineralized but intact Sinuses/Orbits: Partial opacification of LEFT sphenoid sinus, chronic. Remaining visualized paranasal sinuses and mastoid air cells clear Other: N/A CT CERVICAL SPINE FINDINGS Alignment: Normal Skull base and vertebrae: Diffuse osseous demineralization. Skull base intact. Multilevel disc space narrowing and endplate spur formation. Vertebral body heights maintained without fracture or bone destruction. Scattered facet degenerative changes bilaterally. Encroachment upon cervical neural foramina bilaterally by uncovertebral spurs. Soft tissues and spinal canal: Prevertebral soft tissues normal thickness. Atherosclerotic calcifications at carotid bifurcations. Small isthmic thyroid nodule 16 mm diameter unchanged Disc levels:  No additional abnormalities Upper chest: Lung apices clear Other: Atherosclerotic calcifications aortic arch. IMPRESSION: Atrophy with small vessel chronic ischemic changes of deep cerebral white matter. No acute intracranial abnormalities. Degenerative disc and facet disease changes of the cervical spine. No acute cervical spine abnormalities. Stable isthmic thyroid nodule. Aortic Atherosclerosis (ICD10-I70.0). Electronically Signed   By: Ulyses Southward M.D.   On: 06/22/2018 15:51   Dg C-arm 1-60 Min-no Report  Result  Date: 06/23/2018 Fluoroscopy was utilized by the requesting physician.  No radiographic interpretation.   Dg Hip Operative Unilat W Or W/o Pelvis Right  Result Date: 06/23/2018 CLINICAL DATA:  Right femoral nail EXAM: OPERATIVE RIGHT HIP (WITH PELVIS IF PERFORMED) 4 VIEWS TECHNIQUE: Fluoroscopic spot image(s) were submitted for interpretation post-operatively. COMPARISON:  the previous day's study FINDINGS: 4 fluoroscopic spot images document placement of IM rod and interlocking sliding screw across the intertrochanteric fracture, major fracture fragments in near anatomic alignment IMPRESSION: Internal fixation of intertrochanteric femur fracture. Electronically Signed   By: Corlis Leak M.D.   On: 06/23/2018 09:27   Dg Hip Unilat  With Pelvis 2-3 Views Right  Result Date: 06/22/2018 CLINICAL DATA:  Acute RIGHT hip pain following fall today. Initial encounter. EXAM: DG HIP (WITH OR WITHOUT PELVIS) 2-3V RIGHT COMPARISON:  None. FINDINGS: A comminuted intertrochanteric RIGHT femur fracture is noted with varus angulation. No dislocation. No other fractures are noted. IMPRESSION: Comminuted intertrochanteric RIGHT femur fracture with varus angulation. Electronically Signed   By: Harmon Pier M.D.   On: 06/22/2018 17:04   Dg Femur Min 2 Views Right  Result Date: 06/22/2018 CLINICAL DATA:  Acute RIGHT leg pain following fall. Initial encounter. EXAM: RIGHT FEMUR 2  VIEWS COMPARISON:  None. FINDINGS: A comminuted intertrochanteric femur fracture is noted with varus angulation. No other fracture identified. No dislocation. Degenerative changes in the knee are present. IMPRESSION: Comminuted intertrochanteric RIGHT femur fracture with varus angulation. Electronically Signed   By: Harmon PierJeffrey  Hu M.D.   On: 06/22/2018 17:05    CBC Recent Labs  Lab 06/22/18 1507 06/23/18 0341 06/24/18 1207  WBC 7.9 9.0 9.2  HGB 11.9* 10.4* 9.8*  HCT 38.4 32.9* 31.5*  PLT 275 231 164  MCV 91.4 90.4 92.4  MCH 28.3 28.6 28.7   MCHC 31.0 31.6 31.1  RDW 13.8 13.9 14.0  LYMPHSABS 1.2  --   --   MONOABS 0.6  --   --   EOSABS 0.1  --   --   BASOSABS 0.0  --   --     Chemistries  Recent Labs  Lab 06/22/18 1507 06/23/18 0341  NA 139 136  K 4.1 4.3  CL 103 102  CO2 27 26  GLUCOSE 120* 144*  BUN 25* 21  CREATININE 0.90 0.83  CALCIUM 8.9 8.6*   ------------------------------------------------------------------------------------------------------------------ Recent Labs    06/22/18 2123  TSH 3.949   Shon Haleourage Amed Datta M.D on 06/25/2018 at 6:32 PM  Go to [email or web address] - for contact info  [facility] - Office  [phone]

## 2018-06-25 NOTE — Care Management Important Message (Signed)
Important Message  Patient Details  Name: Colleen Huang MRN: [id number] Date of Birth: 1923/11/23   Medicare Important Message Given:  Yes    Caren Macadam 06/25/2018, 12:54 PMImportant Message  Patient Details  Name: Colleen Huang MRN: [id number] Date of Birth: 1924-04-27   Medicare Important Message Given:  Yes    Caren Macadam 06/25/2018, 12:54 PM

## 2018-06-25 NOTE — [place] FL2 (Addendum)
[place] MEDICAID FL2 LEVEL OF CARE SCREENING TOOL     IDENTIFICATION  Patient Name: Colleen KaufmanMarion S Wuthrich Birthdate: 11/10/1923 Sex: female Admission Date (Current Location): 06/22/2018  [facility] [place]   Facility and Address:  [facility],  [address], [place] [id number]      Provider Number: [id number]  Attending Physician Name and Address:  Shon HaleEmokpae, Courage, MD  Relative Name and Phone Number:       Current Level of Care: Hospital Recommended Level of Care:   Prior Approval Number:    Date Approved/Denied:   PASRR Number: [id number]  Discharge Plan: SNF    Current Diagnoses: Patient Active Problem List   Diagnosis Date Noted  . Hip fracture (HCC) 06/22/2018  . Alzheimer's dementia with behavioral disturbance (HCC) 04/07/2016  . Acute pancreatitis 08/31/2015  . Chest pain 08/31/2015  . Headache 06/18/2015  . Anxiety state 05/19/2015  . Pain in the chest   . Abdominal pain, epigastric 05/18/2015  . Chest pain at rest 05/18/2015  . Dyslipidemia 05/18/2015  . Benign essential HTN 05/18/2015  . Other specified hypothyroidism 05/18/2015    Orientation RESPIRATION BLADDER Height & Weight     Self(Memory Impairment )  Normal Continent Weight:   Height:     BEHAVIORAL SYMPTOMS/MOOD NEUROLOGICAL BOWEL NUTRITION STATUS      Continent Diet(Regular )  AMBULATORY STATUS COMMUNICATION OF NEEDS Skin     Verbally Surgical wounds                       Personal Care Assistance Level of Assistance  Bathing, Feeding, Dressing Bathing Assistance: Limited assistance Feeding assistance: Independent Dressing Assistance: Maximum assistance     Functional Limitations Info  Sight, Hearing, Speech Sight Info: Adequate Hearing Info: Adequate Speech Info: Adequate    SPECIAL CARE FACTORS FREQUENCY  PT (By licensed PT), OT (By licensed OT)     PT Frequency: 5x/week OT Frequency: 5x/week            Contractures  Contractures Info: Not present    Additional Factors Info  Code Status, Allergies, Psychotropic Code Status Info: DNR  Allergies Info: Allergies: Codeine, Hydrocodone, Penicillins, Percocet Oxycodone-acetaminophen, Sulfa Antibiotics Psychotropic Info: Zoloft          Current Medications (06/25/2018):  This is the current hospital active medication list Current Facility-Administered Medications  Medication Dose Route Frequency Provider Last Rate Last Dose  . 0.9 %  sodium chloride infusion  250 mL Intravenous PRN Shon HaleEmokpae, Courage, MD 10 mL/hr at 06/25/18 0939    . acetaminophen (TYLENOL) tablet 650 mg  650 mg Oral Q6H PRN Shon HaleEmokpae, Courage, MD   650 mg at 06/24/18 1713   Or  . acetaminophen (TYLENOL) suppository 650 mg  650 mg Rectal Q6H PRN Emokpae, Courage, MD      . albuterol (PROVENTIL) (2.5 MG/3ML) 0.083% nebulizer solution 2.5 mg  2.5 mg Nebulization Q2H PRN Emokpae, Courage, MD      . amLODipine (NORVASC) tablet 10 mg  10 mg Oral QHS Emokpae, Courage, MD   10 mg at 06/24/18 2115  . bisacodyl (DULCOLAX) suppository 10 mg  10 mg Rectal Once Emokpae, Courage, MD      . calcium carbonate (TUMS - dosed in mg elemental calcium) chewable tablet 200 mg of elemental calcium  1 tablet Oral BID Emokpae, Courage, MD   200 mg of elemental calcium at 06/25/18 0959  . dextrose 5 %-0.45 % sodium chloride infusion   Intravenous  Continuous Shon HaleEmokpae, Courage, MD 10 mL/hr at 06/25/18 1001    . heparin injection 5,000 Units  5,000 Units Subcutaneous Q8H Shon HaleEmokpae, Courage, MD   5,000 Units at 06/25/18 0541  . hydrALAZINE (APRESOLINE) injection 10 mg  10 mg Intravenous Q6H PRN Emokpae, Courage, MD      . latanoprost (XALATAN) 0.005 % ophthalmic solution 1 drop  1 drop Both Eyes QHS Emokpae, Courage, MD   1 drop at 06/24/18 2120  . levothyroxine (SYNTHROID, LEVOTHROID) tablet 88 mcg  88 mcg Oral QAC breakfast Mariea ClontsEmokpae, Courage, MD   88 mcg at 06/25/18 0541  . LORazepam (ATIVAN) injection 0.5 mg  0.5 mg  Intravenous Q4H PRN Emokpae, Courage, MD      . LORazepam (ATIVAN) tablet 0.5 mg  0.5 mg Oral BID Emokpae, Courage, MD   0.5 mg at 06/25/18 1001  . methocarbamol (ROBAXIN) tablet 500 mg  500 mg Oral TID Shon HaleEmokpae, Courage, MD   500 mg at 06/25/18 0959  . morphine 2 MG/ML injection 2 mg  2 mg Intravenous Q4H PRN Shon HaleEmokpae, Courage, MD   2 mg at 06/24/18 2317  . multivitamin liquid   Oral Daily Emokpae, Courage, MD   15 mL at 06/25/18 1001  . ondansetron (ZOFRAN) injection 4 mg  4 mg Intravenous Q6H PRN Mariea ClontsEmokpae, Courage, MD   4 mg at 06/24/18 1430  . polyethylene glycol (MIRALAX / GLYCOLAX) packet 17 g  17 g Oral Daily PRN Emokpae, Courage, MD      . senna-docusate (Senokot-S) tablet 2 tablet  2 tablet Oral BID Shon HaleEmokpae, Courage, MD   2 tablet at 06/25/18 1000  . sertraline (ZOLOFT) tablet 100 mg  100 mg Oral Daily Emokpae, Courage, MD   100 mg at 06/25/18 1002  . sodium chloride flush (NS) 0.9 % injection 3 mL  3 mL Intravenous Q12H Emokpae, Courage, MD      . sodium chloride flush (NS) 0.9 % injection 3 mL  3 mL Intravenous PRN Emokpae, Courage, MD      . traZODone (DESYREL) tablet 50 mg  50 mg Oral QHS PRN Shon HaleEmokpae, Courage, MD         Discharge Medications: Please see discharge summary for a list of discharge medications.  Relevant Imaging Results:  Relevant Lab Results:   Additional Information SSN: [id number]  Clearance CootsNicole A Asencion Guisinger, LCSW

## 2018-06-25 NOTE — Progress Notes (Signed)
Subjective: 2 Days Post-Op Procedure(s) (LRB): INTERTROCHANTERIC NAILING RIGHT FEMUR (Right) Patient reports pain as 0 on 0-10 scale.  Slow to mobilize with physical therapy.  Otherwise doing well.  Objective: Vital signs in last 24 hours: Temp:  [98.4 F (36.9 C)-98.6 F (37 C)] 98.6 F (37 C) (01/13 0541) Pulse Rate:  [65-70] 70 (01/13 0541) Resp:  [16] 16 (01/13 0541) BP: (112-118)/(46-90) 115/90 (01/13 0541) SpO2:  [92 %-95 %] 95 % (01/13 0541)  Intake/Output from previous day: 01/12 0701 - 01/13 0700 In: 2752.1 [P.O.:1540; I.V.:1212.1] Out: 2550 [Urine:2550] Intake/Output this shift: Total I/O In: 120 [P.O.:120] Out: -   Recent Labs    06/22/18 1507 06/23/18 0341 06/24/18 1207  HGB 11.9* 10.4* 9.8*   Recent Labs    06/23/18 0341 06/24/18 1207  WBC 9.0 9.2  RBC 3.64* 3.41*  HCT 32.9* 31.5*  PLT 231 164   Recent Labs    06/22/18 1507 06/23/18 0341  NA 139 136  K 4.1 4.3  CL 103 102  CO2 27 26  BUN 25* 21  CREATININE 0.90 0.83  GLUCOSE 120* 144*  CALCIUM 8.9 8.6*   Awake and alert.  Family at bedside. Dressing intact to right lower extremity has been reinforced.  No new saturations.  Is able to move right lower extremity without difficulty.  Endorses sensation to light touch in the foot.  Assessment/Plan: 2 Days Post-Op Procedure(s) (LRB): INTERTROCHANTERIC NAILING RIGHT FEMUR (Right)  Continue to mobilize with physical therapy. Weightbearing as tolerated right lower extremity Plan is for SNF placement Will be discharged on 325 mg aspirin for DVT prophylaxis. Would avoid narcotics in this elderly patient.  She will follow-up with me in 2 weeks for x-rays, wound check and suture removal.    Terance Hart 06/25/2018, 10:03 AM

## 2018-06-25 NOTE — Clinical Social Work Note (Signed)
Clinical Social Work Assessment  Patient Details  Name: Colleen Huang MRN: [id number] Date of Birth: May 29, 1924  Date of referral:  06/25/18               Reason for consult:  Discharge Planning                Permission sought to share information with:    Permission granted to share information::  Yes, Verbal Permission Granted  Name::        Agency::  SNF  Relationship::  Son-Jim Mayson   Contact Information:     Housing/Transportation Living arrangements for the past 2 months:  Single Family Home Source of Information:  Power of Attorney Patient Interpreter Needed:  None Criminal Activity/Legal Involvement Pertinent to Current Situation/Hospitalization:  No - Comment as needed Significant Relationships:  Merchandiser, retail, Adult Children Lives with:  Self Do you feel safe going back to the place where you live?  Yes Need for family participation in patient care:  Yes (Comment)  Care giving concerns:   Patient admitted for Hip Fracture.  PT recommending short term rehab at SNF.   Social Worker assessment / plan: CSW discussed discharge planning with the patient son Rosanne Ashing. Per son, the patient lives alone and has 24 hour caregivers to assist with her care.  Patient son reports he discussed patient going SNF for therapy with the patient and other family members, they are agreeable. Patient son express they prefer [facility] for rehab. CSW explain SNF process and will follow up with bed offers.   Completed FL2 intiated SNF search for SNF beds.  PASRR done.  CSW started the insurance authorization process for BCBS-Medicare.   Plan: SNF Barriers to discharge: Insurance auth. Pending.  Employment status:  Retired Database administrator PT Recommendations:  Skilled Nursing Facility Information / Referral to community resources:  Skilled Nursing Facility  Patient/Family's Response to care: Agreeable and Responding well to care.   Patient/Family's  Understanding of and Emotional Response to Diagnosis, Current Treatment, and Prognosis:  Patient has dementia. Patient adult children understand her diagnosis and need [facility] at [facility].   Emotional Assessment Appearance:  Appears stated age Attitude/Demeanor/Rapport:    Affect (typically observed):  Accepting Orientation:  Oriented to Self, Oriented to Situation, Oriented to Place, Oriented to  Time Alcohol / Substance use:  Not Applicable Psych involvement (Current and /or in the community):  No (Comment)  Discharge Needs  Concerns to be addressed:  Discharge Planning Concerns Readmission within the last 30 days:  No Current discharge risk:  Physical Impairment, Dependent with Mobility Barriers to Discharge:  Continued Medical Work up, Insurance Authorization   Clearance Coots, LCSW 06/25/2018, 3:48 PM

## 2018-06-26 ENCOUNTER — Encounter (HOSPITAL_COMMUNITY): Payer: Self-pay | Admitting: Orthopaedic Surgery

## 2018-06-26 DIAGNOSIS — Z4789 Encounter for other orthopedic aftercare: Secondary | ICD-10-CM | POA: Diagnosis not present

## 2018-06-26 DIAGNOSIS — G309 Alzheimer's disease, unspecified: Secondary | ICD-10-CM | POA: Diagnosis not present

## 2018-06-26 DIAGNOSIS — K5900 Constipation, unspecified: Secondary | ICD-10-CM | POA: Diagnosis not present

## 2018-06-26 DIAGNOSIS — M62838 Other muscle spasm: Secondary | ICD-10-CM | POA: Diagnosis not present

## 2018-06-26 DIAGNOSIS — Z7982 Long term (current) use of aspirin: Secondary | ICD-10-CM | POA: Diagnosis not present

## 2018-06-26 DIAGNOSIS — H409 Unspecified glaucoma: Secondary | ICD-10-CM | POA: Diagnosis not present

## 2018-06-26 DIAGNOSIS — F329 Major depressive disorder, single episode, unspecified: Secondary | ICD-10-CM | POA: Diagnosis not present

## 2018-06-26 DIAGNOSIS — Z7401 Bed confinement status: Secondary | ICD-10-CM | POA: Diagnosis not present

## 2018-06-26 DIAGNOSIS — F419 Anxiety disorder, unspecified: Secondary | ICD-10-CM | POA: Diagnosis not present

## 2018-06-26 DIAGNOSIS — K5909 Other constipation: Secondary | ICD-10-CM | POA: Diagnosis not present

## 2018-06-26 DIAGNOSIS — S72141D Displaced intertrochanteric fracture of right femur, subsequent encounter for closed fracture with routine healing: Secondary | ICD-10-CM | POA: Diagnosis not present

## 2018-06-26 DIAGNOSIS — E039 Hypothyroidism, unspecified: Secondary | ICD-10-CM | POA: Diagnosis not present

## 2018-06-26 DIAGNOSIS — I10 Essential (primary) hypertension: Secondary | ICD-10-CM | POA: Diagnosis not present

## 2018-06-26 DIAGNOSIS — M25551 Pain in right hip: Secondary | ICD-10-CM | POA: Diagnosis not present

## 2018-06-26 DIAGNOSIS — E46 Unspecified protein-calorie malnutrition: Secondary | ICD-10-CM | POA: Diagnosis not present

## 2018-06-26 DIAGNOSIS — F0281 Dementia in other diseases classified elsewhere with behavioral disturbance: Secondary | ICD-10-CM | POA: Diagnosis not present

## 2018-06-26 DIAGNOSIS — S72001A Fracture of unspecified part of neck of right femur, initial encounter for closed fracture: Secondary | ICD-10-CM | POA: Diagnosis not present

## 2018-06-26 DIAGNOSIS — Z5189 Encounter for other specified aftercare: Secondary | ICD-10-CM | POA: Diagnosis not present

## 2018-06-26 DIAGNOSIS — S72354D Nondisplaced comminuted fracture of shaft of right femur, subsequent encounter for closed fracture with routine healing: Secondary | ICD-10-CM | POA: Diagnosis not present

## 2018-06-26 DIAGNOSIS — M2550 Pain in unspecified joint: Secondary | ICD-10-CM | POA: Diagnosis not present

## 2018-06-26 DIAGNOSIS — W19XXXD Unspecified fall, subsequent encounter: Secondary | ICD-10-CM | POA: Diagnosis not present

## 2018-06-26 DIAGNOSIS — W19XXXA Unspecified fall, initial encounter: Secondary | ICD-10-CM | POA: Diagnosis not present

## 2018-06-26 LAB — BASIC METABOLIC PANEL
Anion gap: 10 (ref 5–15)
BUN: 12 mg/dL (ref 8–23)
CHLORIDE: 101 mmol/L (ref 98–111)
CO2: 27 mmol/L (ref 22–32)
CREATININE: 0.76 mg/dL (ref 0.44–1.00)
Calcium: 8.9 mg/dL (ref 8.9–10.3)
GFR calc Af Amer: >60 mL/min (ref >60)
GFR calc non Af Amer: >60 mL/min (ref >60)
Glucose, Bld: 108 mg/dL — ABNORMAL HIGH (ref 70–99)
POTASSIUM: 3.5 mmol/L (ref 3.5–5.1)
Sodium: 138 mmol/L (ref 135–145)

## 2018-06-26 LAB — CBC
HCT: 29.4 % — ABNORMAL LOW (ref 36.0–46.0)
Hemoglobin: 9.4 g/dL — ABNORMAL LOW (ref 12.0–15.0)
MCH: 28.7 pg (ref 26.0–34.0)
MCHC: 32 g/dL (ref 30.0–36.0)
MCV: 89.9 fL (ref 80.0–100.0)
Platelets: 259 10*3/uL (ref 150–400)
RBC: 3.27 MIL/uL — AB (ref 3.87–5.11)
RDW: 13.7 % (ref 11.5–15.5)
WBC: 8.8 10*3/uL (ref 4.0–10.5)
nRBC: 0 % (ref 0.0–0.2)

## 2018-06-26 MED ORDER — CALCIUM CARBONATE ANTACID 500 MG PO CHEW: 1.0000 | CHEWABLE_TABLET | Freq: Two times a day (BID) | ORAL | 1 refills | Status: DC

## 2018-06-26 MED ORDER — METHOCARBAMOL 500 MG PO TABS: 500.0000 mg | ORAL_TABLET | Freq: Three times a day (TID) | ORAL | 1 refills | Status: DC

## 2018-06-26 MED ORDER — LORAZEPAM 0.5 MG PO TABS: 0.5000 mg | ORAL_TABLET | Freq: Two times a day (BID) | ORAL | 0 refills | Status: DC | PRN

## 2018-06-26 MED ORDER — LEVOTHYROXINE SODIUM 88 MCG PO TABS: 88.0000 ug | ORAL_TABLET | Freq: Every day | ORAL | 1 refills | Status: DC

## 2018-06-26 MED ORDER — SENNOSIDES-DOCUSATE SODIUM 8.6-50 MG PO TABS: 2.0000 | ORAL_TABLET | Freq: Two times a day (BID) | ORAL | 1 refills | Status: DC

## 2018-06-26 MED ORDER — ACETAMINOPHEN 325 MG PO TABS: 650.0000 mg | ORAL_TABLET | Freq: Four times a day (QID) | ORAL | 1 refills | Status: DC | PRN

## 2018-06-26 MED ORDER — ASPIRIN 325 MG PO TABS: 325.0000 mg | ORAL_TABLET | Freq: Every day | ORAL | 1 refills | Status: AC

## 2018-06-26 MED ORDER — ADULT MULTIVITAMIN LIQUID CH: 5.0000 mL | Freq: Every day | ORAL | 1 refills | Status: DC

## 2018-06-26 MED ORDER — TRAMADOL HCL 50 MG PO TABS: 50.0000 mg | ORAL_TABLET | Freq: Two times a day (BID) | ORAL | 0 refills | Status: AC | PRN

## 2018-06-26 NOTE — Progress Notes (Signed)
Report called to [facility]. Family notifdied

## 2018-06-26 NOTE — Discharge Summary (Signed)
Colleen Huang, is a 85 y.o. female  DOB 08/20/1923  MRN [id number].  Admission date:  06/22/2018  Admitting Physician  Shon Hale, MD  Discharge Date:  06/26/2018   Primary MD  [name], MD  Recommendations for primary care physician for things to follow:   1)Weightbearing as tolerated right lower extremity 2)Will be discharged on 325 mg aspirin for 1 month for DVT prophylaxis. 3) follow-up with Dr Susa Simmonds in 2 weeks for x-rays, wound check and suture removal. 4)Repeat BMP and CBC in 2 weeks with Dr Susa Simmonds   Admission Diagnosis  Closed nondisplaced comminuted fracture of shaft of right femur, initial encounter [facility]) [S72.354A]   Discharge Diagnosis  Closed nondisplaced comminuted fracture of shaft of right femur, initial encounter (HCC) [S72.354A]    Principal Problem:   Hip fracture (HCC) Active Problems:   Benign essential HTN   Other specified hypothyroidism   Alzheimer's dementia with behavioral disturbance [facility])      Past Medical History:  Diagnosis Date  . Anxiety    anxiety with episodes of angry outbursts" feels as being treated as a child"  . Dementia (HCC)   . Diabetes mellitus without complication (HCC)    past history in Epic note from Neurlogy visit  . Falls   . Headache    recent neurology visit to evaluate headaches.  . Hearing loss    wears hearing aids  . Memory difficulties    "repeats self alot"- has increasing levels of memory issues  . Pancreatitis 04/2015   [facility]    Past Surgical History:  Procedure Laterality Date  . APPENDECTOMY  1935  . CHOLECYSTECTOMY    . COLONOSCOPY  2005, 2009  . EUS N/A 09/18/2015   Procedure: UPPER ENDOSCOPIC ULTRASOUND (EUS) LINEAR;  Surgeon: Jeani Hawking, MD;  Location: [facility] ENDOSCOPY;  Service: Endoscopy;  Laterality: N/A;  . FEMUR IM NAIL Right 06/23/2018   Procedure:  INTERTROCHANTERIC NAILING RIGHT FEMUR;  Surgeon: Terance Hart, MD;  Location: [facility] ORS;  Service: Orthopedics;  Laterality: Right;  . FLEXIBLE SIGMOIDOSCOPY  2005  . GALLBLADDER SURGERY  1997  . PARATHYROIDECTOMY / EXPLORATION OF PARATHYROIDS  2003  . RECTAL POLYPECTOMY  2005  . TONSILLECTOMY  1949       HPI  from the history and physical done on the day of admission:   Colleen Huang  is a 85 y.o. female with past medical history relevant for hypertension, advanced dementia with significant cognitive deficits at baseline as well as hypothyroidism who presents to the ED after falling----patient was ambulating with a caregiver as she was stepping down the curb she fell backwards----caregiver noticed hip pain and right lower extremity deformity   this appears to be purely mechanical fall no evidence of syncope or other concerns... Obvious bleeding, no seizure concerns, no loss of consciousness, no incontinence  Patient is a very poor historian due to advanced dementia with significant cognitive deficits, most of the history is obtained from patient's son [name] at  bedside as well as patient's female caregiver who was actually present when she fell  In ED--- CT head and CT of the C-spine without acute findings Hip and pelvic imaging studies demonstrate comminuted fracture of the right femoral neck--orthopedic consult from Dr. Susa Simmonds requested-  Plan is for surgical correction possibly on 06/23/2018--- patient's son is agreeable with plan  Patient son tells me that patient is a DNR/DNI  Patient apparently has intolerances to oral opiates will use IV morphine sulfate IV fentanyl as needed  From a preop standpoint patient has no cardiovascular or respiratory complaints at this time Creatinine 0.9, hemoglobin is 11.9 with white count of 7.9, platelets are 275, EKG not available at this time      Hospital Course:   Brief Summary: 85 y.o.femalewith past medical history  relevant for hypertension, advanced dementia with significant cognitive deficits at baseline as well as hypothyroidism admitted on 06/22/2018 with right hip fracture secondary to mechanical fall underwent surgical fixation on 06/23/2018, transfer to SNF placement for rehab  Plan:- 1)Rt Hip Fx--post mechanical Fall--- s/p Cephalo-medullary nailing of right intertrochanteric femur fracture on 06/23/2018-- further management per orthopedic team, weightbearing as tolerated, follow-up with orthopedic surgeon in 2 weeks for recheck  2)HTN--stable, c/n  amlodipine 10 mg daily,  3)Social/Ethics---patient is a DNR/DNI, verified with patient's son [name] at bedside, patient's otherson who lives in [place] is the primary decision maker, patient has caregivers usually at bedside  4)Dementia with advanced cognitive deficits--------patient may have sundowning, may use lorazepam as needed   5)Hypothyroidism--- TSH is 3.9, stable,  continue levothyroxine 88 mcg daily  Code Status : DNR  Family Communication:   Son and caregiver at bedside   Disposition Plan  : PT/OT Eval noted, Transfer to SNF rehab placement  Consults  :  ortho  DVT Prophylaxis  :   -Aspirin for DVT prophylaxis per orthopedic surgeon  Discharge Condition: stable  Follow UP   Contact information for follow-up providers    Terance Hart, MD In 2 weeks.   Specialty:  Orthopedic Surgery Contact information: [address] [place] [address] [phone]            Contact information for after-discharge care    Destination    [facility] Preferred SNF .   Service:  Skilled Nursing Contact information: [address] [place] [address] [phone]                   Consults obtained - ortho  Diet and Activity recommendation:  As advised  Discharge Instructions    Discharge Instructions    Call MD for:  difficulty breathing, headache or  visual disturbances   Complete by:  As directed    Call MD for:  persistant dizziness or light-headedness   Complete by:  As directed    Call MD for:  persistant nausea and vomiting   Complete by:  As directed    Call MD for:  redness, tenderness, or signs of infection (pain, swelling, redness, odor or green/yellow discharge around incision site)   Complete by:  As directed    Call MD for:  severe uncontrolled pain   Complete by:  As directed    Call MD for:  temperature >100.4   Complete by:  As directed    Diet - low sodium heart healthy   Complete by:  As directed    Discharge instructions   Complete by:  As directed    1)Weightbearing as tolerated  right lower extremity 2)Will be discharged on 325 mg aspirin for 1 month for DVT prophylaxis. 3) follow-up with Dr Susa Simmonds in 2 weeks for x-rays, wound check and suture removal. 4)Repeat BMP and CBC in 2 weeks with Dr Susa Simmonds   Walk with assistance   Complete by:  As directed         Discharge Medications     Allergies as of 06/26/2018      Reactions   Codeine Other (See Comments)   Really bad reaction - that's all she can remember    Hydrocodone Other (See Comments)   Really bad reaction - that's all she can remember    Penicillins Itching, Swelling, Rash   Has patient had a PCN reaction causing immediate rash, facial/tongue/throat swelling, SOB or lightheadedness with hypotension: Yes  Has patient had a PCN reaction causing severe rash involving mucus membranes or skin necrosis: Yes  Has patient had a PCN reaction that required hospitalization: No  Has patient had a PCN reaction occurring within the last 10 years: No  If all of the above answers are "NO", then may proceed with Cephalosporin use.   Percocet [oxycodone-acetaminophen] Other (See Comments)   Unknown   Sulfa Antibiotics Other (See Comments)   Unknown reaction       Medication List    TAKE these medications   acetaminophen 325 MG tablet Commonly known as:   TYLENOL Take 2 tablets (650 mg total) by mouth every 6 (six) hours as needed for mild pain, fever or headache (or Fever >/= 101).   amLODipine 10 MG tablet Commonly known as:  NORVASC Take 10 mg by mouth at bedtime.   aspirin 325 MG tablet Commonly known as:  BAYER ASPIRIN Take 1 tablet (325 mg total) by mouth daily with breakfast.   calcium carbonate 500 MG chewable tablet Commonly known as:  TUMS - dosed in mg elemental calcium Chew 1 tablet (200 mg of elemental calcium total) by mouth 2 (two) times daily.   latanoprost 0.005 % ophthalmic solution Commonly known as:  XALATAN Place 1 drop into both eyes at bedtime.   levothyroxine 88 MCG tablet Commonly known as:  SYNTHROID, LEVOTHROID Take 1 tablet (88 mcg total) by mouth daily before breakfast. Start taking on:  June 27, 2018 What changed:  when to take this   LORazepam 0.5 MG tablet Commonly known as:  ATIVAN Take 1 tablet (0.5 mg total) by mouth 2 (two) times daily as needed for anxiety or sleep. What changed:    when to take this  reasons to take this   methocarbamol 500 MG tablet Commonly known as:  ROBAXIN Take 1 tablet (500 mg total) by mouth 3 (three) times daily.   multivitamin Liqd Take 5 mLs by mouth daily. Start taking on:  June 27, 2018   MULTIVITAMIN PO Take 1 tablet by mouth daily.   senna-docusate 8.6-50 MG tablet Commonly known as:  Senokot-S Take 2 tablets by mouth 2 (two) times daily.   sertraline 100 MG tablet Commonly known as:  ZOLOFT Take 100 mg by mouth daily. What changed:  Another medication with the same name was removed. Continue taking this medication, and follow the directions you see here.   traMADol 50 MG tablet Commonly known as:  ULTRAM Take 1 tablet (50 mg total) by mouth every 12 (twelve) hours as needed for moderate pain or severe pain.   triamcinolone cream 0.1 % Commonly known as:  KENALOG Apply 1 application topically 2 (two) times daily  as needed.        Major procedures and Radiology Reports - PLEASE review detailed and final reports for all details, in brief -   Ct Head Wo Contrast  Result Date: 06/22/2018 CLINICAL DATA:  Larey Seat onto asphalt while attempting to step up onto a curb minor head trauma, high clinical risk; cervical spine trauma, high clinical risk; history dementia, diabetes mellitus, hypertension EXAM: CT HEAD WITHOUT CONTRAST CT CERVICAL SPINE WITHOUT CONTRAST TECHNIQUE: Multidetector CT imaging of the head and cervical spine was performed following the standard protocol without intravenous contrast. Multiplanar CT image reconstructions of the cervical spine were also generated. COMPARISON:  02/21/2018 FINDINGS: CT HEAD FINDINGS Brain: Generalized atrophy. Normal ventricular morphology. No midline shift or mass effect. Small vessel chronic ischemic changes of deep cerebral white matter. No intracranial hemorrhage, mass lesion, evidence of acute infarction, or extra-axial fluid collection. Vascular: Atherosclerotic calcifications of internal carotid arteries at skull base Skull: Demineralized but intact Sinuses/Orbits: Partial opacification of LEFT sphenoid sinus, chronic. Remaining visualized paranasal sinuses and mastoid air cells clear Other: N/A CT CERVICAL SPINE FINDINGS Alignment: Normal Skull base and vertebrae: Diffuse osseous demineralization. Skull base intact. Multilevel disc space narrowing and endplate spur formation. Vertebral body heights maintained without fracture or bone destruction. Scattered facet degenerative changes bilaterally. Encroachment upon cervical neural foramina bilaterally by uncovertebral spurs. Soft tissues and spinal canal: Prevertebral soft tissues normal thickness. Atherosclerotic calcifications at carotid bifurcations. Small isthmic thyroid nodule 16 mm diameter unchanged Disc levels:  No additional abnormalities Upper chest: Lung apices clear Other: Atherosclerotic calcifications aortic arch. IMPRESSION:  Atrophy with small vessel chronic ischemic changes of deep cerebral white matter. No acute intracranial abnormalities. Degenerative disc and facet disease changes of the cervical spine. No acute cervical spine abnormalities. Stable isthmic thyroid nodule. Aortic Atherosclerosis (ICD10-I70.0). Electronically Signed   By: Ulyses Southward M.D.   On: 06/22/2018 15:51   Ct Cervical Spine Wo Contrast  Result Date: 06/22/2018 CLINICAL DATA:  Larey Seat onto asphalt while attempting to step up onto a curb minor head trauma, high clinical risk; cervical spine trauma, high clinical risk; history dementia, diabetes mellitus, hypertension EXAM: CT HEAD WITHOUT CONTRAST CT CERVICAL SPINE WITHOUT CONTRAST TECHNIQUE: Multidetector CT imaging of the head and cervical spine was performed following the standard protocol without intravenous contrast. Multiplanar CT image reconstructions of the cervical spine were also generated. COMPARISON:  02/21/2018 FINDINGS: CT HEAD FINDINGS Brain: Generalized atrophy. Normal ventricular morphology. No midline shift or mass effect. Small vessel chronic ischemic changes of deep cerebral white matter. No intracranial hemorrhage, mass lesion, evidence of acute infarction, or extra-axial fluid collection. Vascular: Atherosclerotic calcifications of internal carotid arteries at skull base Skull: Demineralized but intact Sinuses/Orbits: Partial opacification of LEFT sphenoid sinus, chronic. Remaining visualized paranasal sinuses and mastoid air cells clear Other: N/A CT CERVICAL SPINE FINDINGS Alignment: Normal Skull base and vertebrae: Diffuse osseous demineralization. Skull base intact. Multilevel disc space narrowing and endplate spur formation. Vertebral body heights maintained without fracture or bone destruction. Scattered facet degenerative changes bilaterally. Encroachment upon cervical neural foramina bilaterally by uncovertebral spurs. Soft tissues and spinal canal: Prevertebral soft tissues normal  thickness. Atherosclerotic calcifications at carotid bifurcations. Small isthmic thyroid nodule 16 mm diameter unchanged Disc levels:  No additional abnormalities Upper chest: Lung apices clear Other: Atherosclerotic calcifications aortic arch. IMPRESSION: Atrophy with small vessel chronic ischemic changes of deep cerebral white matter. No acute intracranial abnormalities. Degenerative disc and facet disease changes of the cervical spine. No acute cervical spine abnormalities. Stable  isthmic thyroid nodule. Aortic Atherosclerosis (ICD10-I70.0). Electronically Signed   By: Ulyses Southward M.D.   On: 06/22/2018 15:51   Dg C-arm 1-60 Min-no Report  Result Date: 06/23/2018 Fluoroscopy was utilized by the requesting physician.  No radiographic interpretation.   Dg Hip Operative Unilat W Or W/o Pelvis Right  Result Date: 06/23/2018 CLINICAL DATA:  Right femoral nail EXAM: OPERATIVE RIGHT HIP (WITH PELVIS IF PERFORMED) 4 VIEWS TECHNIQUE: Fluoroscopic spot image(s) were submitted for interpretation post-operatively. COMPARISON:  the previous day's study FINDINGS: 4 fluoroscopic spot images document placement of IM rod and interlocking sliding screw across the intertrochanteric fracture, major fracture fragments in near anatomic alignment IMPRESSION: Internal fixation of intertrochanteric femur fracture. Electronically Signed   By: Corlis Leak M.D.   On: 06/23/2018 09:27   Dg Hip Unilat  With Pelvis 2-3 Views Right  Result Date: 06/22/2018 CLINICAL DATA:  Acute RIGHT hip pain following fall today. Initial encounter. EXAM: DG HIP (WITH OR WITHOUT PELVIS) 2-3V RIGHT COMPARISON:  None. FINDINGS: A comminuted intertrochanteric RIGHT femur fracture is noted with varus angulation. No dislocation. No other fractures are noted. IMPRESSION: Comminuted intertrochanteric RIGHT femur fracture with varus angulation. Electronically Signed   By: Harmon Pier M.D.   On: 06/22/2018 17:04   Dg Femur Min 2 Views Right  Result Date:  06/22/2018 CLINICAL DATA:  Acute RIGHT leg pain following fall. Initial encounter. EXAM: RIGHT FEMUR 2 VIEWS COMPARISON:  None. FINDINGS: A comminuted intertrochanteric femur fracture is noted with varus angulation. No other fracture identified. No dislocation. Degenerative changes in the knee are present. IMPRESSION: Comminuted intertrochanteric RIGHT femur fracture with varus angulation. Electronically Signed   By: Harmon Pier M.D.   On: 06/22/2018 17:05    Micro Results    Recent Results (from the past 240 hour(s))  Surgical pcr screen     Status: None   Collection Time: 06/23/18  2:47 AM  Result Value Ref Range Status   MRSA, PCR NEGATIVE NEGATIVE Final   Staphylococcus aureus NEGATIVE NEGATIVE Final    Comment: (NOTE) The Xpert SA Assay (FDA approved for NASAL specimens in patients 3 years of age and older), is one component of a comprehensive surveillance program. It is not intended to diagnose infection nor to guide or monitor treatment. Performed at [facility], [address], [place], [place] [address]        Today   Subjective    [name] today has no new complaints, No fever  Or chills, nausea, vomiting, diarrhea , caregiver at bedside, questions answered          Patient has been seen and examined prior to discharge   Objective   Blood pressure (!) 132/57, pulse 72, temperature 98.6 F (37 C), temperature source Oral, resp. rate 16, height 5\' 5"  (1.651 m), weight 70 kg, SpO2 95 %.   Intake/Output Summary (Last 24 hours) at 06/26/2018 1316 Last data filed at 06/26/2018 1017 Gross per 24 hour  Intake 586 ml  Output 1700 ml  Net -1114 ml    Exam Physical Examination: General appearance -awake, elderlyand in no distress Mental status - alert, oriented to person,disoriented to place,baseline cognitive deficits eyes - sclera anicteric Neck - supple, no JVD elevation , Chest - clear to auscultation bilaterally, symmetrical  air movement,  Heart - S1 and S2 normal, regular  Abdomen - soft, nontender, nondistended, no masses or organomegaly Neurological -  neck supple without rigidity, cranial nerves II through XII intact, DTR's normal and  symmetric Extremities - no pedal edema noted, intact peripheral pulses  Skin - warm, dry MSK--right hip postop wound is clean, dry, intact   Data Review   CBC w Diff:  Lab Results  Component Value Date   WBC 8.8 06/26/2018   HGB 9.4 (L) 06/26/2018   HGB 12.6 07/29/2015   HCT 29.4 (L) 06/26/2018   HCT 38.1 07/29/2015   PLT 259 06/26/2018   PLT 287 07/29/2015   LYMPHOPCT 16 06/22/2018   MONOPCT 8 06/22/2018   EOSPCT 1 06/22/2018   BASOPCT 0 06/22/2018    CMP:  Lab Results  Component Value Date   NA 138 06/26/2018   NA 142 07/29/2015   K 3.5 06/26/2018   CL 101 06/26/2018   CO2 27 06/26/2018   BUN 12 06/26/2018   BUN 18 07/29/2015   CREATININE 0.76 06/26/2018   PROT 7.9 02/21/2018   PROT 6.3 07/29/2015   ALBUMIN 4.3 02/21/2018   ALBUMIN 4.2 07/29/2015   BILITOT 1.1 02/21/2018   BILITOT 0.8 07/29/2015   ALKPHOS 104 02/21/2018   AST 25 02/21/2018   ALT 17 02/21/2018  .   Total Discharge time is about 33 minutes  Shon Haleourage Bradshaw Minihan M.D on 06/26/2018 at 1:16 PM  Go to [email or web address] -  for contact info  [facility] - Office  [phone]

## 2018-06-26 NOTE — Clinical Social Work Placement (Addendum)
Patient insurance auth received. Approved starting 1/14-1/16 [id number] PT/OT-TE, Speech SD, Nursing PDE1, Nontherapy NF Nurse call report to: [phone] Room 404B   CLINICAL SOCIAL WORK PLACEMENT  NOTE  Date:  06/26/2018  Patient Details  Name: Colleen Huang MRN: [id number] Date of Birth: Jul 31, 1923  Clinical Social Work is seeking post-discharge placement for this patient at the Skilled  Nursing Facility level of care (*CSW will initial, date and re-position this form in  chart as items are completed):      Patient/family provided with [facility] Clinical Social Work Department's list of facilities offering this level of care within the geographic area requested by the patient (or if unable, by the patient's family).      Patient/family informed of their freedom to choose among providers that offer the needed level of care, that participate in Medicare, Medicaid or managed care program needed by the patient, have an available bed and are willing to accept the patient.      Patient/family informed of [facility]'s ownership interest in [facility] and [facility], as well as of the fact that they are under no obligation to receive care at these facilities.  PASRR submitted to EDS on       PASRR number received on       Existing PASRR number confirmed on       FL2 transmitted to all facilities in geographic area requested by pt/family on       FL2 transmitted to all facilities within larger geographic area on 06/26/18     Patient informed that his/her managed care company has contracts with or will negotiate with certain facilities, including the following:        Yes   Patient/family informed of bed offers received.  Patient chooses bed at [facility] [place]     Physician recommends and patient chooses bed at      Patient to be transferred to [facility] [place] on 06/26/18.  Patient to be transferred to facility by [facility]     Patient family notified on  06/26/18 of transfer.  Name of family member notified:    Son Rosanne Ashing    PHYSICIAN       Additional Comment:    _______________________________________________ Clearance Coots, LCSW 06/26/2018, 11:46 AM

## 2018-06-26 NOTE — Plan of Care (Signed)
Plan of care reviewed.  Patient unable to retain/understand new information d/t  diminished cognitive function.

## 2018-06-26 NOTE — Discharge Instructions (Signed)
1)Weightbearing as tolerated right lower extremity 2)Will be discharged on 325 mg aspirin for 1 month for DVT prophylaxis. 3) follow-up with Dr Susa SimmondsAdair in 2 weeks for x-rays, wound check and suture removal. 4)Repeat BMP and CBC in 2 weeks with Dr Susa SimmondsAdair  Hip Fracture Treated With ORIF  A hip fracture is a break in the upper part of the thigh bone (femur). This is usually the result of an injury, often a fall. Open reduction with internal fixation (ORIF) is a common surgery used to fix a hip fracture or a broken hip. A surgical cut (incision) in the skin will be made to open the fracture area. This lets your health care provider see the broken bone. The bone pieces will then be put back together. Hardware, such as nails, pins, or compression screws will be used to hold the bone pieces in place. Tell a health care provider about:  Any allergies you have.  All medicines you are taking, including vitamins, herbs, eye drops, creams, and over-the-counter medicines.  Any problems you or family members have had with anesthetic medicines.  Any blood disorders you have.  Any surgeries you have had.  Any medical conditions you have.  Whether you are pregnant or may be pregnant. What are the risks? Generally, this is a safe procedure. However, problems may occur, including:  Infection.  Bleeding.  Allergic reactions to medicines.  Blood clot. This can form in the leg and travel to the lungs.  Damage to nerves or other structures.  Lung infection (pneumonia).  Continuing pain.  Difficulty walking.  Failure of bones to heal. What happens before the procedure? Staying hydrated Follow instructions from your health care provider about hydration, which may include:  Up to 2 hours before the procedure - you may continue to drink clear liquids, such as water, clear fruit juice, black coffee, and plain tea. Eating and drinking restrictions Follow instructions from your health care provider  about eating and drinking, which may include:  8 hours before the procedure - stop eating heavy meals or foods such as meat, fried foods, or fatty foods.  6 hours before the procedure - stop eating light meals or foods, such as toast or cereal.  6 hours before the procedure - stop drinking milk or drinks that contain milk.  2 hours before the procedure - stop drinking clear liquids. Medicines Ask your health care provider about:  Changing or stopping your regular medicines. This is especially important if you are taking diabetes medicines or blood thinners.  Taking medicines such as aspirin and ibuprofen. These medicines can thin your blood. Do not take these medicines unless your health care provider tells you to take them.  Taking over-the-counter medicines, vitamins, herbs, and supplements. General instructions  A thorough medical exam will be done to ensure that you are healthy enough for surgery. Tests include: ? A physical exam. ? A test of how your nerves and veins work (neurovascular assessment). ? Blood tests. ? Urine test. ? Electrocardiogram (ECG).  You may have imaging tests such as X-rays, CT scan, or MRI.  Plan to have someone take you home from the hospital or clinic.  Plan to have someone care for you for several days after you leave the hospital or clinic. This is important.  Ask your health care provider what steps will be taken to prevent infection. These may include: ? Removing hair at the surgery site. ? Washing skin with a germ-killing soap. ? Antibiotic medicine. What happens during the  procedure?  An IV will be inserted into one of your veins.  You will be given one or more of the following: ? A medicine to help you relax (sedative). ? A medicine to numb the area (local anesthetic). ? A medicine that makes you fall asleep (general anesthetic). ? A medicine that is injected into your spine to numb the area below and slightly above the injection site  (spinal anesthetic). ? A medicine that is injected into an area of your body to numb everything below the injection site (regional anesthetic).  Small monitors will be attached to your body. They will be used to check your heart, blood pressure, and oxygen level.  You will receive an antibiotic through your IV during surgery.  A catheter may be inserted into your bladder to collect urine while you are asleep.  Your health care provider will: ? Make an incision over the hip. ? Move your bone pieces back into normal alignment and position. ? Secure the bone pieces with hardware (nails, pins, or compression screws).  X-rays may be taken to make sure the bone pieces are in the correct position.  The incision will be closed with small stitches (sutures) or staples.  A bandage (dressing) or some other kind of wound cover will be placed over the incision. The procedure may vary among health care providers and hospitals. What happens after the procedure?  Your blood pressure, heart rate, breathing rate, and blood oxygen level will be monitored until you leave the hospital or clinic.  You may continue to receive fluids through the IV.  You will receive additional antibiotics through your IV for the next 24 hours.  You will be given medicine for pain as needed.  You may receive a blood thinner to prevent blood clots.  You may have to wear compression stockings. These stockings help to prevent blood clots and reduce swelling in your legs.  You may be given instructions about how much body weight you can safely support on your injured leg (weight-bearing restrictions). You may be given crutches, a cane, or a walker to help you move around so that you do not bear any weight on your injured leg.  You will be helped out of bed so you can begin moving around. This is important to improve blood flow and breathing. Summary  Open reduction with internal fixation (ORIF) is a common surgery used to  fix a hip fracture or a broken hip.  You will be given anesthetic medicines to make you fall asleep, and your health care provider will fix your fracture with nails, pins, or compression screws.  After the surgery, you may receive a blood thinner or wear compression stockings to prevent blood clots. This information is not intended to replace advice given to you by your health care provider. Make sure you discuss any questions you have with your health care provider. Document Released: 05/18/2009 Document Revised: 07/10/2017 Document Reviewed: 07/10/2017 [employer] Interactive Patient Education  2019 [employer]   1)Weightbearing as tolerated right lower extremity 2)Will be discharged on 325 mg aspirin for 1 month for DVT prophylaxis. 3) follow-up with Dr Susa SimmondsAdair in 2 weeks for x-rays, wound check and suture removal. 4)Repeat BMP and CBC in 2 weeks with Dr Susa SimmondsAdair

## 2018-07-01 DIAGNOSIS — E46 Unspecified protein-calorie malnutrition: Secondary | ICD-10-CM | POA: Diagnosis not present

## 2018-07-01 DIAGNOSIS — F0281 Dementia in other diseases classified elsewhere with behavioral disturbance: Secondary | ICD-10-CM | POA: Diagnosis not present

## 2018-07-01 DIAGNOSIS — K5909 Other constipation: Secondary | ICD-10-CM | POA: Diagnosis not present

## 2018-07-01 DIAGNOSIS — S72354D Nondisplaced comminuted fracture of shaft of right femur, subsequent encounter for closed fracture with routine healing: Secondary | ICD-10-CM | POA: Diagnosis not present

## 2018-07-04 ENCOUNTER — Ambulatory Visit: Payer: Medicare Other | Admitting: Neurology

## 2018-07-09 DIAGNOSIS — S72141D Displaced intertrochanteric fracture of right femur, subsequent encounter for closed fracture with routine healing: Secondary | ICD-10-CM | POA: Diagnosis not present

## 2018-07-15 DIAGNOSIS — K5909 Other constipation: Secondary | ICD-10-CM | POA: Diagnosis not present

## 2018-07-15 DIAGNOSIS — E46 Unspecified protein-calorie malnutrition: Secondary | ICD-10-CM | POA: Diagnosis not present

## 2018-07-15 DIAGNOSIS — S72354D Nondisplaced comminuted fracture of shaft of right femur, subsequent encounter for closed fracture with routine healing: Secondary | ICD-10-CM | POA: Diagnosis not present

## 2018-07-30 DIAGNOSIS — R4182 Altered mental status, unspecified: Secondary | ICD-10-CM | POA: Diagnosis not present

## 2018-07-30 DIAGNOSIS — Z79899 Other long term (current) drug therapy: Secondary | ICD-10-CM | POA: Diagnosis not present

## 2018-07-30 DIAGNOSIS — N390 Urinary tract infection, site not specified: Secondary | ICD-10-CM | POA: Diagnosis not present

## 2018-08-01 DIAGNOSIS — F419 Anxiety disorder, unspecified: Secondary | ICD-10-CM | POA: Diagnosis not present

## 2018-08-01 DIAGNOSIS — F339 Major depressive disorder, recurrent, unspecified: Secondary | ICD-10-CM | POA: Diagnosis not present

## 2018-08-01 DIAGNOSIS — G309 Alzheimer's disease, unspecified: Secondary | ICD-10-CM | POA: Diagnosis not present

## 2018-08-01 DIAGNOSIS — F0390 Unspecified dementia without behavioral disturbance: Secondary | ICD-10-CM | POA: Diagnosis not present

## 2018-08-15 DIAGNOSIS — R2681 Unsteadiness on feet: Secondary | ICD-10-CM | POA: Diagnosis not present

## 2018-08-15 DIAGNOSIS — Z4789 Encounter for other orthopedic aftercare: Secondary | ICD-10-CM | POA: Diagnosis not present

## 2018-08-15 DIAGNOSIS — R278 Other lack of coordination: Secondary | ICD-10-CM | POA: Diagnosis not present

## 2018-08-15 DIAGNOSIS — M6281 Muscle weakness (generalized): Secondary | ICD-10-CM | POA: Diagnosis not present

## 2018-08-15 DIAGNOSIS — M25551 Pain in right hip: Secondary | ICD-10-CM | POA: Diagnosis not present

## 2018-08-15 DIAGNOSIS — S72354D Nondisplaced comminuted fracture of shaft of right femur, subsequent encounter for closed fracture with routine healing: Secondary | ICD-10-CM | POA: Diagnosis not present

## 2018-08-15 DIAGNOSIS — F0391 Unspecified dementia with behavioral disturbance: Secondary | ICD-10-CM | POA: Diagnosis not present

## 2018-08-15 DIAGNOSIS — F419 Anxiety disorder, unspecified: Secondary | ICD-10-CM | POA: Diagnosis not present

## 2018-08-15 DIAGNOSIS — F339 Major depressive disorder, recurrent, unspecified: Secondary | ICD-10-CM | POA: Diagnosis not present

## 2018-08-15 DIAGNOSIS — G309 Alzheimer's disease, unspecified: Secondary | ICD-10-CM | POA: Diagnosis not present

## 2018-08-29 DIAGNOSIS — G309 Alzheimer's disease, unspecified: Secondary | ICD-10-CM | POA: Diagnosis not present

## 2018-08-29 DIAGNOSIS — F419 Anxiety disorder, unspecified: Secondary | ICD-10-CM | POA: Diagnosis not present

## 2018-08-29 DIAGNOSIS — F0391 Unspecified dementia with behavioral disturbance: Secondary | ICD-10-CM | POA: Diagnosis not present

## 2018-08-29 DIAGNOSIS — F339 Major depressive disorder, recurrent, unspecified: Secondary | ICD-10-CM | POA: Diagnosis not present

## 2018-09-02 DIAGNOSIS — E038 Other specified hypothyroidism: Secondary | ICD-10-CM | POA: Diagnosis not present

## 2018-09-02 DIAGNOSIS — I10 Essential (primary) hypertension: Secondary | ICD-10-CM | POA: Diagnosis not present

## 2018-09-02 DIAGNOSIS — F418 Other specified anxiety disorders: Secondary | ICD-10-CM | POA: Diagnosis not present

## 2018-09-02 DIAGNOSIS — M25551 Pain in right hip: Secondary | ICD-10-CM | POA: Diagnosis not present

## 2018-09-12 DIAGNOSIS — F0391 Unspecified dementia with behavioral disturbance: Secondary | ICD-10-CM | POA: Diagnosis not present

## 2018-09-12 DIAGNOSIS — F419 Anxiety disorder, unspecified: Secondary | ICD-10-CM | POA: Diagnosis not present

## 2018-09-12 DIAGNOSIS — G309 Alzheimer's disease, unspecified: Secondary | ICD-10-CM | POA: Diagnosis not present

## 2018-09-12 DIAGNOSIS — F339 Major depressive disorder, recurrent, unspecified: Secondary | ICD-10-CM | POA: Diagnosis not present

## 2019-05-16 IMAGING — CR DG LUMBAR SPINE COMPLETE 4+V
5 series · 5 of 5 positions shown · non-contrast
Comparison: Lumbar spine series of May 17, 2016

CLINICAL DATA: Patient fell yesterday and has low back and
coccygeal region pain.

EXAM:
LUMBAR SPINE - COMPLETE 4+ VIEW

[t l-spine a.p.]
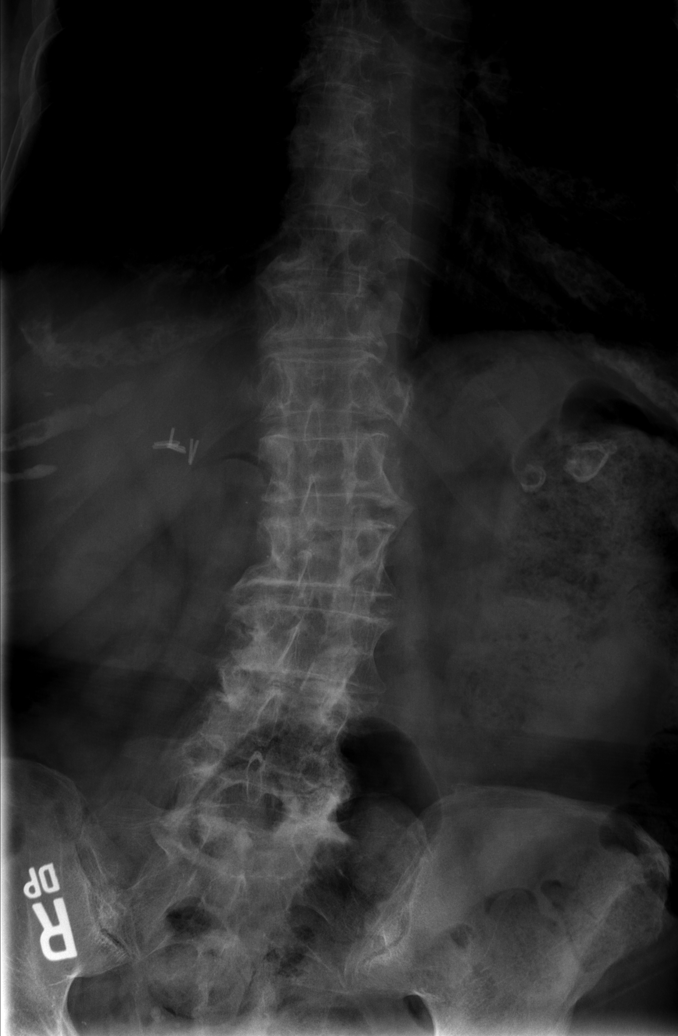

[t l-spine oblique exposure (1 of 2)]
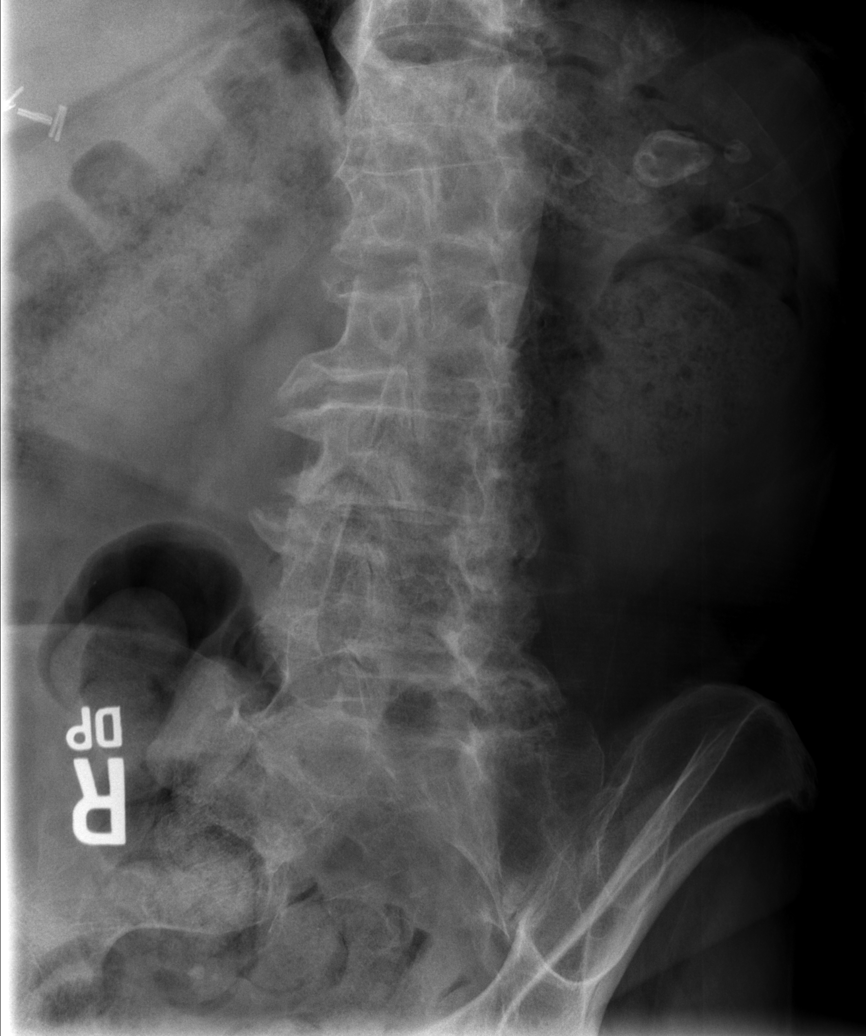

[t l-spine oblique exposure (2 of 2)]
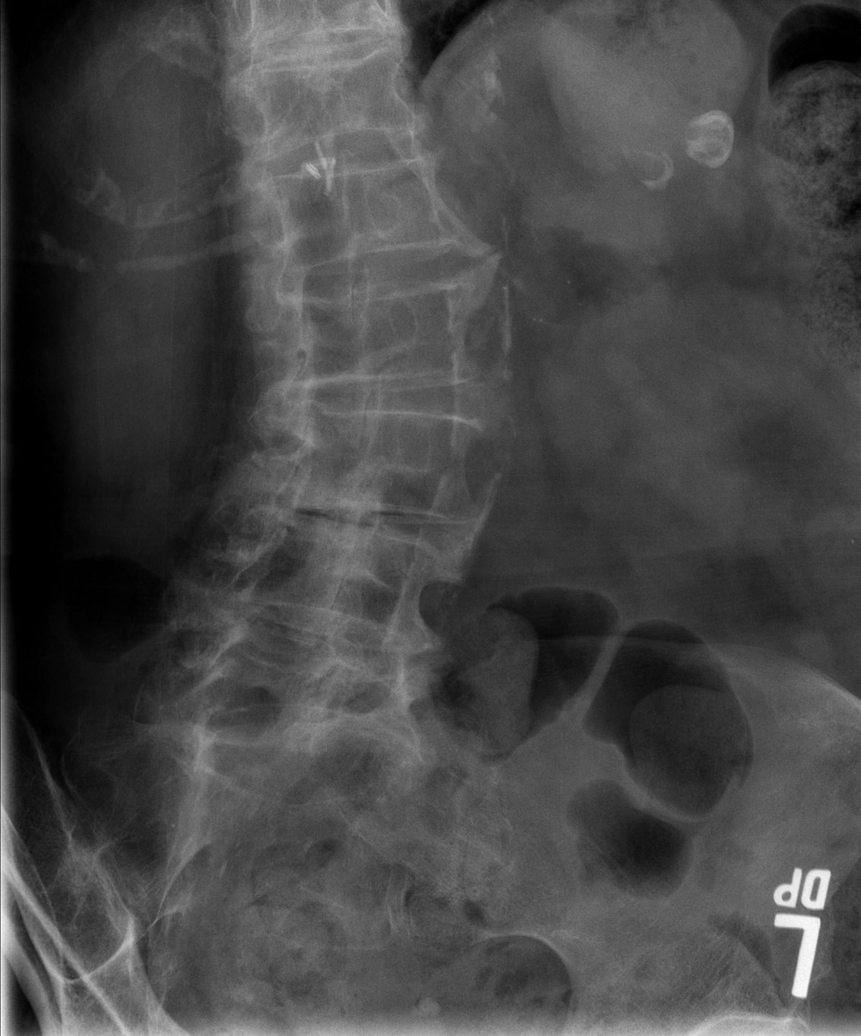

[t l-spine lat]
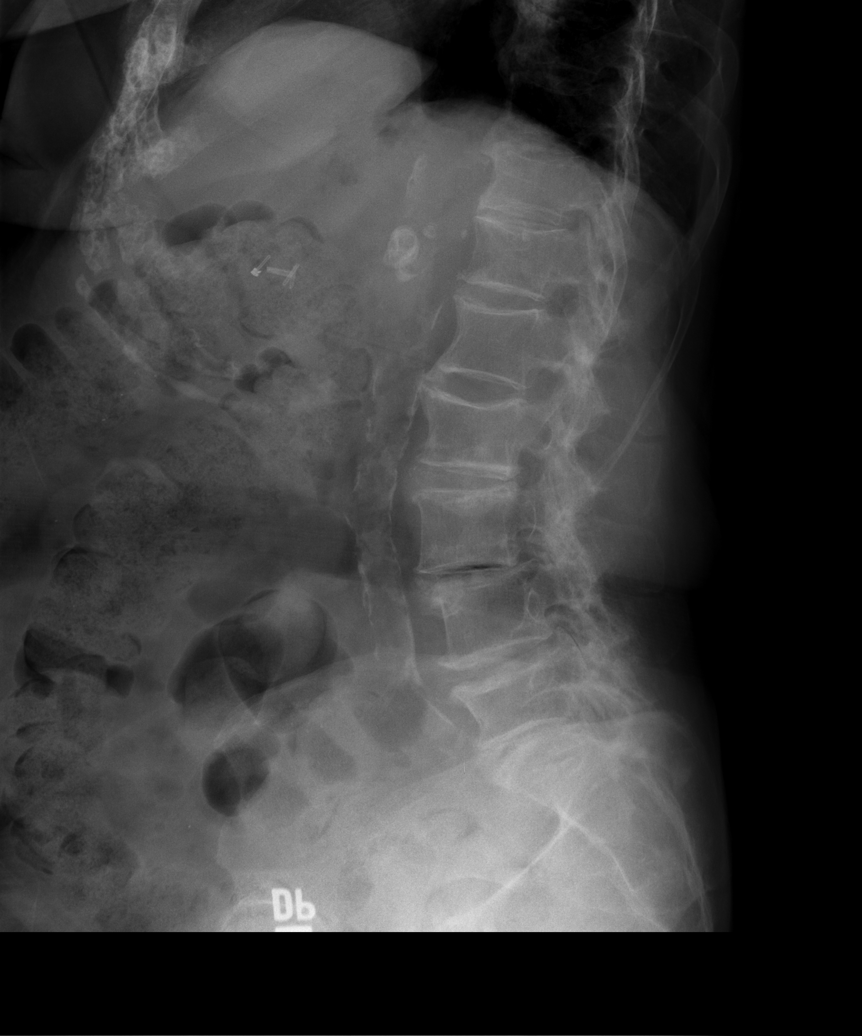

[t l-spine l5-s1 spot]
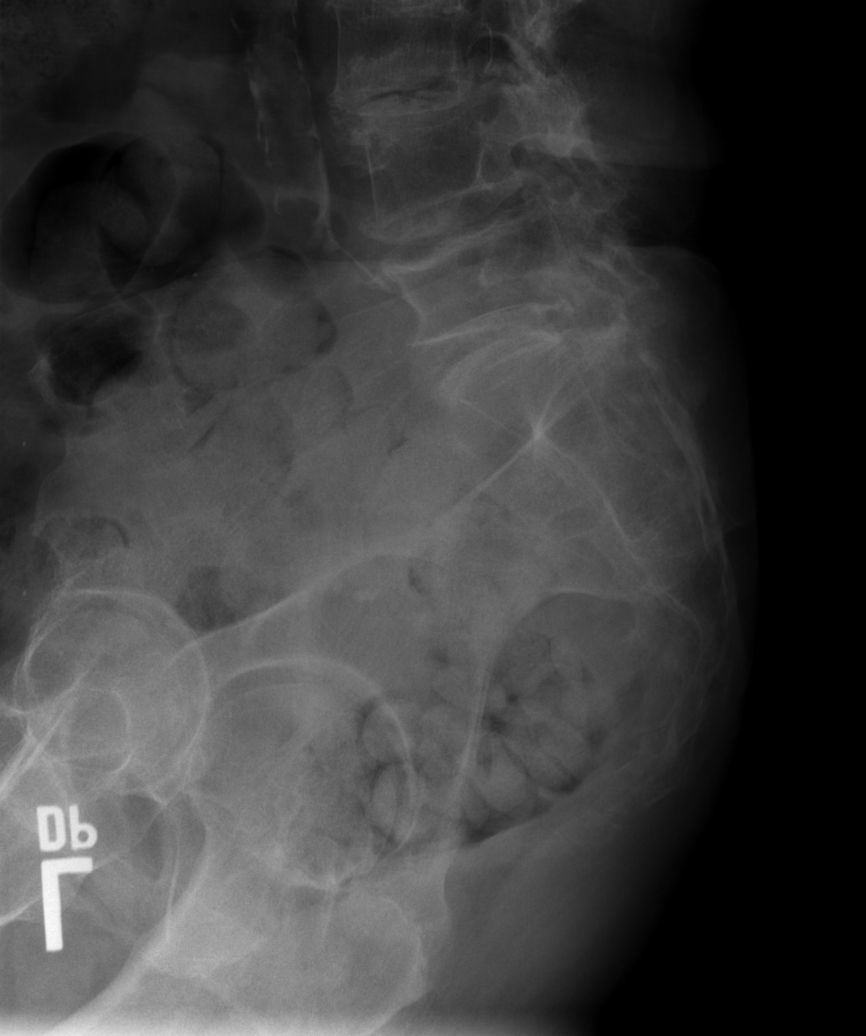

[5 of 5 positions shown; findings below may reference images not displayed]

FINDINGS: The bones are subjectively osteopenic. There is chronic curvature of
the lumbar spine with the convexity toward the left. There is
chronic partial compression of the body of L5 which appears stable.
There is new mild compression of the superior endplate of L2 with
minimal loss of height anteriorly. There is stable grand grade 1
anterolisthesis of L3 with respect L4. There is moderate
degenerative disc space narrowing here with milder disc space
narrowing at L2-3, L4-5, and L5-S1. This disc space narrowing is
stable. There is multilevel facet joint hypertrophy. The sacrum and
coccyx are grossly intact though fine bony detail is lacking due to
osteopenia. No definite displaced coccygeal fracture is observed on
the lateral view. The pre sacral and pre coccygeal soft tissues
appear normal.
IMPRESSION: There is new mild superior endplate depression of L2 with loss of
height of 10% or less anteriorly.

Stable partial compression of L5. Multilevel degenerative disc space
narrowing, stable.

As best as can be determined there is no acute sacral or coccygeal
fracture. Evaluation of the sacrum and coccyx is limited due to
osteopenia.

## 2019-09-12 IMAGING — CR DG CHEST 2V
2 series · 2 of 2 positions shown · non-contrast
Comparison: Radiographs February 21, 2018.

CLINICAL DATA: Left rib pain after fall.

EXAM:
CHEST - 2 VIEW

[w chest pa]
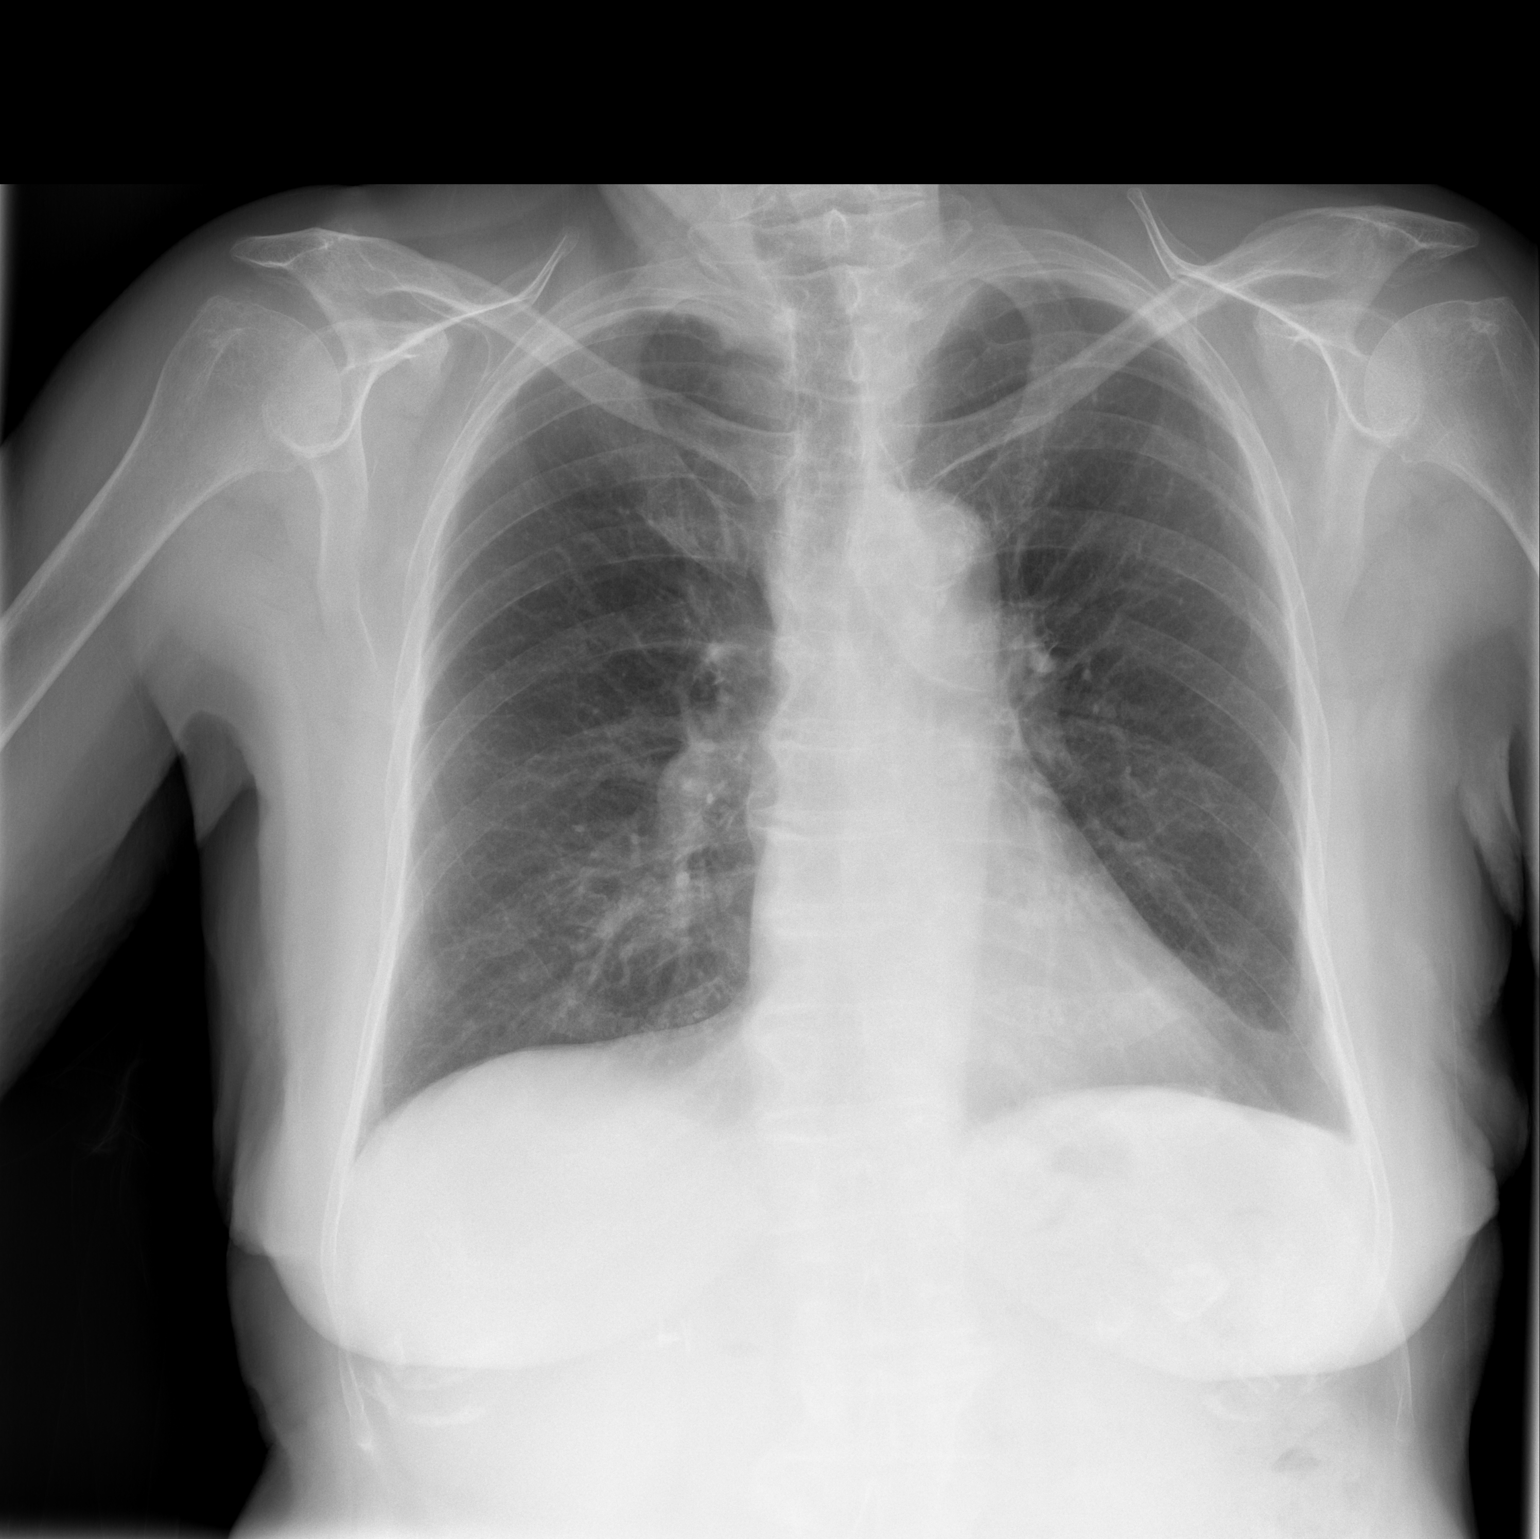

[w chest lat]
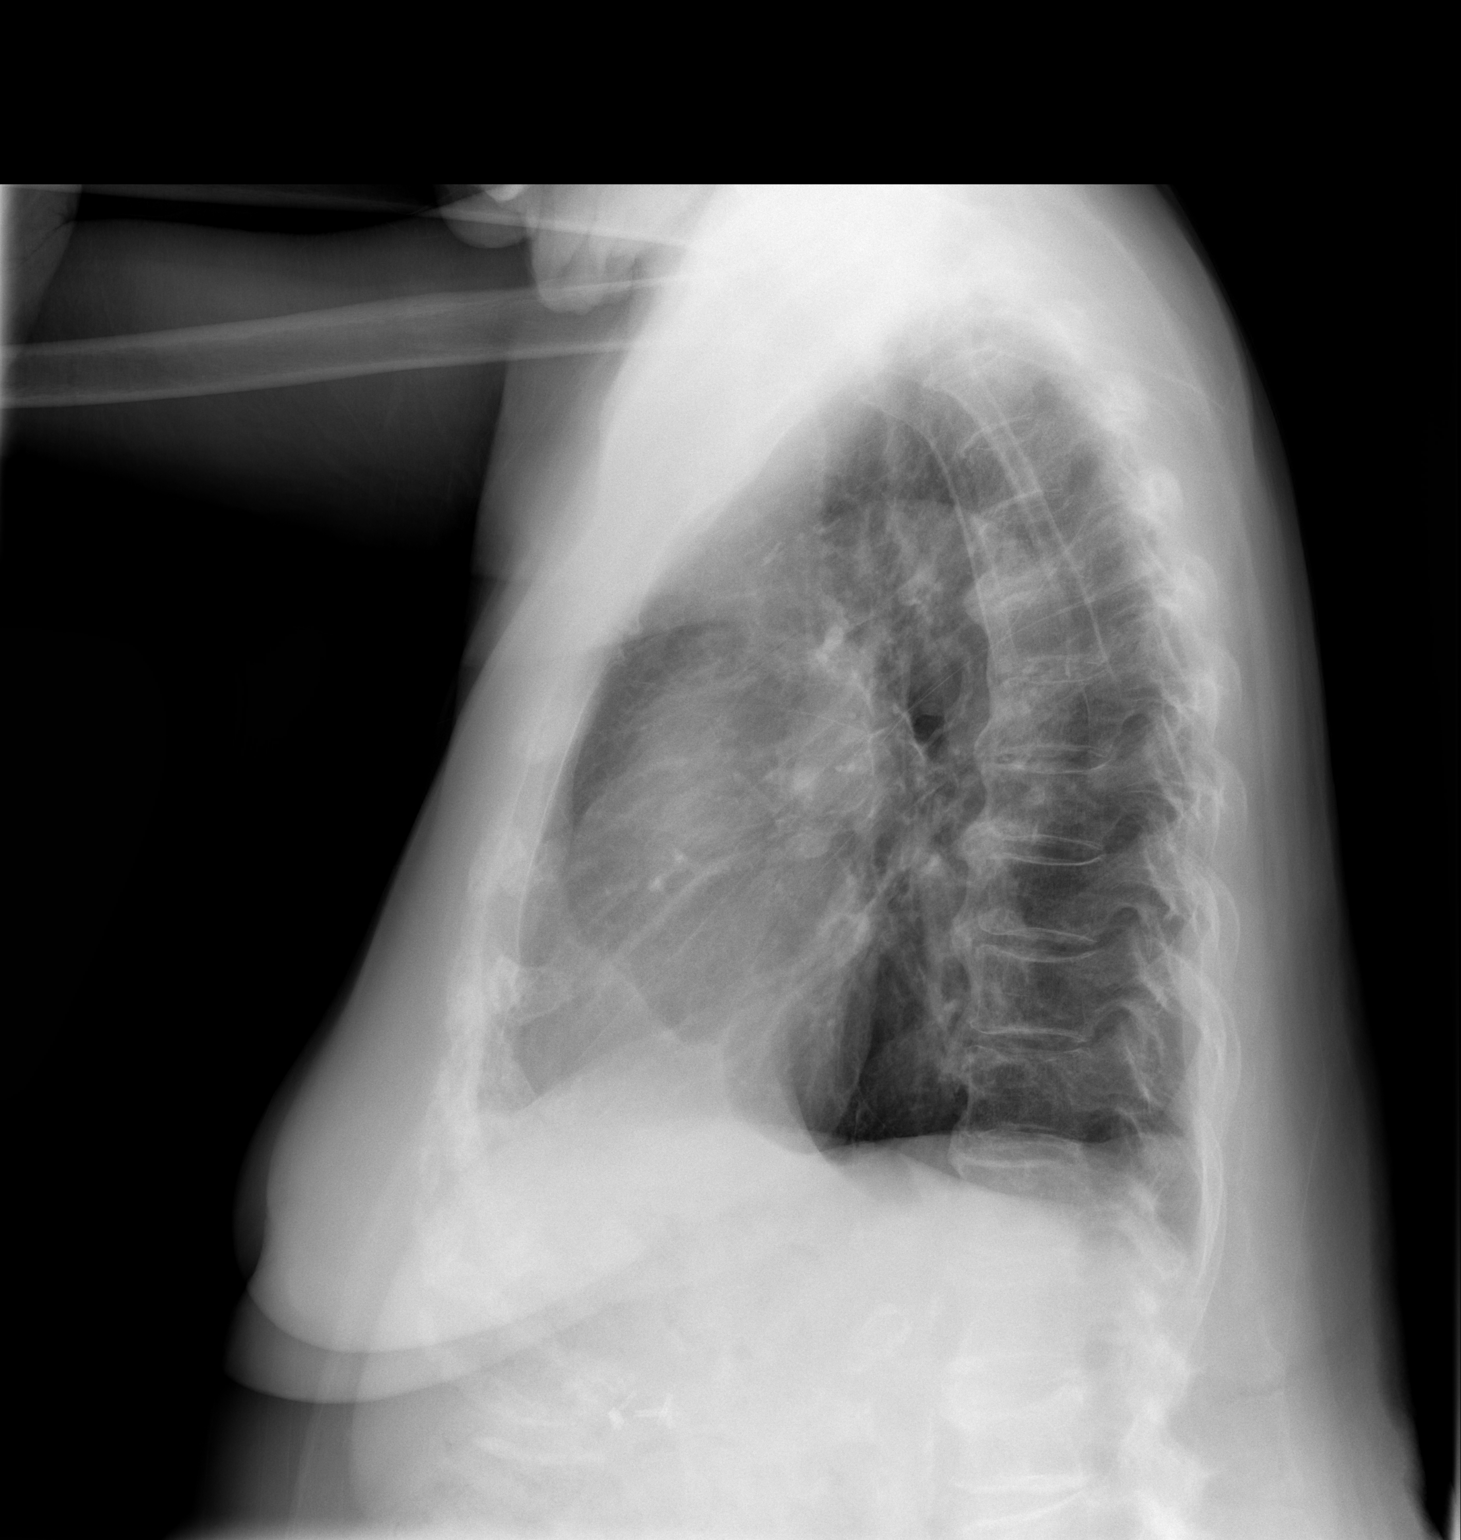

[2 of 2 positions shown; findings below may reference images not displayed]

FINDINGS: The heart size and mediastinal contours are within normal limits.
Both lungs are clear. No pneumothorax or pleural effusion is noted.
Atherosclerosis of thoracic aorta is noted. The visualized skeletal
structures are unremarkable.
IMPRESSION: No active cardiopulmonary disease.

Aortic Atherosclerosis ([id number]-J9Z.Z).

## 2020-02-12 DEATH — deceased
